# Patient Record
Sex: Male | Born: 1965 | Race: White | Hispanic: No | Marital: Married | Smoking: Former smoker
Health system: Southern US, Community
[De-identification: ages and names within clinical notes are randomized; demographics above are authoritative.]

## PROBLEM LIST (undated history)

## (undated) DIAGNOSIS — Z860100 Personal history of colon polyps, unspecified: Secondary | ICD-10-CM

## (undated) DIAGNOSIS — E785 Hyperlipidemia, unspecified: Secondary | ICD-10-CM

## (undated) DIAGNOSIS — Z87442 Personal history of urinary calculi: Secondary | ICD-10-CM

## (undated) DIAGNOSIS — Z8669 Personal history of other diseases of the nervous system and sense organs: Secondary | ICD-10-CM

## (undated) DIAGNOSIS — M2550 Pain in unspecified joint: Secondary | ICD-10-CM

## (undated) DIAGNOSIS — R531 Weakness: Secondary | ICD-10-CM

## (undated) HISTORY — PX: VASECTOMY: SHX75

## (undated) HISTORY — PX: COLONOSCOPY: SHX174

## (undated) HISTORY — PX: CARPAL TUNNEL RELEASE: SHX101

---

## 2000-10-30 ENCOUNTER — Emergency Department (HOSPITAL_COMMUNITY): Admission: EM | Admit: 2000-10-30 | Discharge: 2000-10-31 | Payer: Self-pay | Admitting: Emergency Medicine

## 2000-11-07 ENCOUNTER — Encounter: Admission: RE | Admit: 2000-11-07 | Discharge: 2000-11-07 | Payer: Self-pay | Admitting: Family Medicine

## 2003-03-12 ENCOUNTER — Encounter: Admission: RE | Admit: 2003-03-12 | Discharge: 2003-03-12 | Payer: Self-pay | Admitting: Family Medicine

## 2006-11-19 ENCOUNTER — Encounter: Payer: Self-pay | Admitting: Family Medicine

## 2006-12-31 ENCOUNTER — Encounter: Payer: Self-pay | Admitting: Family Medicine

## 2008-12-10 ENCOUNTER — Ambulatory Visit: Payer: Self-pay | Admitting: Family Medicine

## 2008-12-10 DIAGNOSIS — R490 Dysphonia: Secondary | ICD-10-CM

## 2008-12-15 ENCOUNTER — Ambulatory Visit: Payer: Self-pay | Admitting: Family Medicine

## 2008-12-17 LAB — CONVERTED CEMR LAB
CO2: 29 meq/L (ref 19–32)
Chloride: 106 meq/L (ref 96–112)
Cholesterol: 235 mg/dL — ABNORMAL HIGH (ref 0–200)
Creatinine, Ser: 1 mg/dL (ref 0.4–1.5)
Direct LDL: 143.3 mg/dL
PSA: 0.64 ng/mL (ref 0.10–4.00)
Potassium: 4.9 meq/L (ref 3.5–5.1)
Sodium: 143 meq/L (ref 135–145)
Total CHOL/HDL Ratio: 7
VLDL: 45.8 mg/dL — ABNORMAL HIGH (ref 0.0–40.0)

## 2009-03-21 ENCOUNTER — Ambulatory Visit: Payer: Self-pay | Admitting: Family Medicine

## 2009-03-22 LAB — CONVERTED CEMR LAB
Cholesterol: 204 mg/dL — ABNORMAL HIGH (ref 0–200)
HDL: 35.5 mg/dL — ABNORMAL LOW (ref >39.00)
Triglycerides: 196 mg/dL — ABNORMAL HIGH (ref 0.0–149.0)
VLDL: 39.2 mg/dL (ref 0.0–40.0)

## 2009-05-18 ENCOUNTER — Ambulatory Visit: Payer: Self-pay | Admitting: Family Medicine

## 2009-05-18 DIAGNOSIS — M2140 Flat foot [pes planus] (acquired), unspecified foot: Secondary | ICD-10-CM | POA: Insufficient documentation

## 2009-05-18 DIAGNOSIS — M25569 Pain in unspecified knee: Secondary | ICD-10-CM | POA: Insufficient documentation

## 2009-06-06 ENCOUNTER — Ambulatory Visit: Payer: Self-pay | Admitting: Family Medicine

## 2009-06-06 DIAGNOSIS — M7750 Other enthesopathy of unspecified foot: Secondary | ICD-10-CM | POA: Insufficient documentation

## 2009-06-21 ENCOUNTER — Ambulatory Visit: Payer: Self-pay | Admitting: Family Medicine

## 2009-06-21 DIAGNOSIS — E7800 Pure hypercholesterolemia, unspecified: Secondary | ICD-10-CM

## 2009-06-22 LAB — CONVERTED CEMR LAB
Cholesterol: 226 mg/dL — ABNORMAL HIGH (ref 0–200)
Direct LDL: 124.5 mg/dL
Total CHOL/HDL Ratio: 6
VLDL: 40.2 mg/dL — ABNORMAL HIGH (ref 0.0–40.0)

## 2009-10-10 ENCOUNTER — Ambulatory Visit: Payer: Self-pay | Admitting: Family Medicine

## 2009-10-10 DIAGNOSIS — M719 Bursopathy, unspecified: Secondary | ICD-10-CM

## 2009-10-10 DIAGNOSIS — M67919 Unspecified disorder of synovium and tendon, unspecified shoulder: Secondary | ICD-10-CM | POA: Insufficient documentation

## 2009-10-21 ENCOUNTER — Encounter: Payer: Self-pay | Admitting: Family Medicine

## 2009-10-21 ENCOUNTER — Ambulatory Visit: Payer: Self-pay | Admitting: Family Medicine

## 2009-10-21 DIAGNOSIS — L909 Atrophic disorder of skin, unspecified: Secondary | ICD-10-CM | POA: Insufficient documentation

## 2009-10-21 DIAGNOSIS — L919 Hypertrophic disorder of the skin, unspecified: Secondary | ICD-10-CM

## 2009-10-24 ENCOUNTER — Telehealth: Payer: Self-pay | Admitting: Family Medicine

## 2010-03-19 ENCOUNTER — Encounter: Payer: Self-pay | Admitting: Family Medicine

## 2010-03-28 NOTE — Letter (Signed)
Summary: [facility]  [facility]   Imported By: Lanelle Bal 05/26/2009 13:55:28  _____________________________________________________________________  External Attachment:    Type:   Image     Comment:   External Document

## 2010-03-28 NOTE — Miscellaneous (Signed)
Summary: Consent to Special Procedure  Consent to Special Procedure   Imported By: Lanelle Bal 11/02/2009 11:16:43  _____________________________________________________________________  External Attachment:    Type:   Image     Comment:   External Document
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
## 2010-03-28 NOTE — Assessment & Plan Note (Signed)
Summary: KNEE ,FOOT PAIN/CLE   Vital Signs:  Patient profile:   45 year old male Height:      65 inches Weight:      174.38 pounds BMI:     29.12 Temp:     98.4 degrees F oral Pulse rate:   96 / minute Pulse rhythm:   regular BP sitting:   106 / 78  (left arm) Cuff size:   regular  Vitals Entered By: Delilah Shan CMA Duncan Dull) (May 18, 2009 3:48 PM) CC: Knee and foot pain   History of Present Illness: 45 yo here with multiple complaints.  Knees have been hurting him for 3 -4 years.  Right >Left.  Says he was seeing a physician at [facility] who told him his knee caps were busted.  Does not have more details than that. Knees are never swollen, red or painful to touch. He stands on concrete all day and by the end of the day, walking is very painful. Never felt his knees lock or give out. Feels it is getting worse over past several months. Would rather see [place] than return to ortho.  Top of right foot hurts frequently as well. Does not give out or turn.    Allergies: No Known Drug Allergies  Review of Systems      See HPI  Physical Exam  General:  Well-developed,well-nourished,in no acute distress; alert,appropriate and cooperative throughout examination Extremities:  Pes planus bilaterally, no obvious instability.   Knee Exam  Knee Exam:    Right:    Inspection:  Normal    Palpation:  Normal    Stability:  stable    Tenderness:  no    Swelling:  no    Erythema:  no    Left:    Inspection:  Normal    Palpation:  Normal    Stability:  unstable medial collateral    Tenderness:  medial collateral    Swelling:  no    Erythema:  no   Impression & Recommendations:  Problem # 1:  KNEE PAIN, BILATERAL (ICD-719.46) Assessment New Will get MRIs of knees given the duration and progression of these symptoms to rule out mensical injury. His updated medication list for this problem includes:    Meloxicam 15 Mg Tabs (Meloxicam) ..... One by mouth daily  Orders: Radiology  Referral (Radiology)  His updated medication list for this problem includes:    Meloxicam 15 Mg Tabs (Meloxicam) ..... One by mouth daily  Problem # 2:  PES PLANUS (ICD-734) Assessment: New Will refer to Dr. Patsy Lager for evaluation, may benefit from orthotics.  Complete Medication List: 1)  Meloxicam 15 Mg Tabs (Meloxicam) .... One by mouth daily  Patient Instructions: 1)  Please stop by to see Shirlee Limerick on your way out. Prescriptions: MELOXICAM 15 MG TABS (MELOXICAM) one by mouth daily  #30 x 0   Entered and Authorized by:   Ruthe Mannan MD   Signed by:   Ruthe Mannan MD on 05/18/2009   Method used:   Electronically to        [facility] (retail)       [address]       [place], [place]  [address]       Ph: [phone]       Fax: [phone]   RxID:   [id number]
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
## 2010-03-28 NOTE — Progress Notes (Signed)
  Phone Note Call from Patient   Caller: Patient Summary of Call: Patient called about scheduling his MRI of shoulder. He has an ortho- Dr Priscille Kluver that had done his surgerys before. [facility] He would like to cancel the MRI and would like to see Dr Priscille Kluver instead, because he knows his history and it might be related. Initial call taken by: Carlton Adam,  October 24, 2009 11:52 AM  Follow-up for Phone Call        Does he need a referral? Ruthe Mannan MD  October 24, 2009 12:10 PM   Additional Follow-up for Phone Call Additional follow up Details #1::        I called and made him an appt for this Friday and also faxed your notes. Additional Follow-up by: Carlton Adam,  October 25, 2009 9:20 AM

## 2010-03-28 NOTE — Letter (Signed)
Summary: [facility]  [facility]   Imported By: Lanelle Bal 05/26/2009 13:54:38  _____________________________________________________________________  External Attachment:    Type:   Image     Comment:   External Document

## 2010-11-13 ENCOUNTER — Other Ambulatory Visit (INDEPENDENT_AMBULATORY_CARE_PROVIDER_SITE_OTHER): Payer: BC Managed Care – PPO

## 2010-11-13 ENCOUNTER — Other Ambulatory Visit: Payer: Self-pay | Admitting: Family Medicine

## 2010-11-13 DIAGNOSIS — Z0000 Encounter for general adult medical examination without abnormal findings: Secondary | ICD-10-CM

## 2010-11-13 DIAGNOSIS — E7800 Pure hypercholesterolemia, unspecified: Secondary | ICD-10-CM

## 2010-11-13 LAB — HEPATIC FUNCTION PANEL
AST: 28 U/L (ref 0–37)
Albumin: 4.3 g/dL (ref 3.5–5.2)
Alkaline Phosphatase: 50 U/L (ref 39–117)
Total Protein: 7.4 g/dL (ref 6.0–8.3)

## 2010-11-13 LAB — LIPID PANEL
Cholesterol: 256 mg/dL — ABNORMAL HIGH (ref 0–200)
Triglycerides: 280 mg/dL — ABNORMAL HIGH (ref 0.0–149.0)
VLDL: 56 mg/dL — ABNORMAL HIGH (ref 0.0–40.0)

## 2010-11-13 LAB — BASIC METABOLIC PANEL
BUN: 14 mg/dL (ref 6–23)
Chloride: 103 mEq/L (ref 96–112)
GFR: 88.17 mL/min (ref >60.00)
Potassium: 4 mEq/L (ref 3.5–5.1)

## 2010-11-13 LAB — LDL CHOLESTEROL, DIRECT: Direct LDL: 157.8 mg/dL

## 2010-11-15 ENCOUNTER — Encounter: Payer: Self-pay | Admitting: Family Medicine

## 2010-11-16 ENCOUNTER — Encounter: Payer: Self-pay | Admitting: Family Medicine

## 2010-11-16 ENCOUNTER — Ambulatory Visit (INDEPENDENT_AMBULATORY_CARE_PROVIDER_SITE_OTHER): Payer: BC Managed Care – PPO | Admitting: Family Medicine

## 2010-11-16 VITALS — BP 110/80 | HR 86 | Temp 97.6°F | Ht 65.0 in | Wt 184.8 lb

## 2010-11-16 DIAGNOSIS — Z0000 Encounter for general adult medical examination without abnormal findings: Secondary | ICD-10-CM

## 2010-11-16 DIAGNOSIS — E7800 Pure hypercholesterolemia, unspecified: Secondary | ICD-10-CM | POA: Insufficient documentation

## 2010-11-16 NOTE — Progress Notes (Signed)
Subjective:    Patient ID: Jeremiah Carpenter, male    DOB: 01/10/1966, 45 y.o.   MRN: [id number]  HPI  45 yo here for CPX.  Overall doing very well. Started a new job with [employer], eating some fattier foods at [employer].  Lipids had previously been diet controlled but have gone back up this month.  Lab Results  Component Value Date   CHOL 256* 11/13/2010   CHOL 226* 06/21/2009   CHOL 204* 03/21/2009   Lab Results  Component Value Date   HDL 41.60 11/13/2010   HDL 40.98 06/21/2009   HDL 11.91* 03/21/2009    Lab Results  Component Value Date   TRIG 280.0* 11/13/2010   TRIG 201.0* 06/21/2009   TRIG 196.0* 03/21/2009    Lab Results  Component Value Date   LDLDIRECT 157.8 11/13/2010   LDLDIRECT 124.5 06/21/2009   LDLDIRECT 131.7 03/21/2009      Patient Active Problem List  Diagnoses  . HYPERCHOLESTEROLEMIA  . SKIN TAG  . Pain in joint, lower leg  . ROTATOR CUFF SYNDROME, LEFT  . METATARSALGIA  . PES PLANUS  . HOARSENESS  . Routine general medical examination at a health care facility   Past Medical History  Diagnosis Date  . Lyme disease    Past Surgical History  Procedure Date  . Carpal tunnel release     bilateral   History  Substance Use Topics  . Smoking status: Former Games developer  . Smokeless tobacco: Not on file  . Alcohol Use: No   Family History  Problem Relation Age of Onset  . Cancer Mother     lung   Allergies not on file Current Outpatient Prescriptions on File Prior to Visit  Medication Sig Dispense Refill  . meloxicam (MOBIC) 15 MG tablet Take 15 mg by mouth daily.           Review of Systems Patient reports no  vision/ hearing changes,anorexia, weight change, fever ,adenopathy, persistant / recurrent hoarseness, swallowing issues, chest pain, edema,persistant / recurrent cough, hemoptysis, dyspnea(rest, exertional, paroxysmal nocturnal), gastrointestinal  bleeding (melena, rectal bleeding), abdominal pain, excessive heart burn, GU  symptoms(dysuria, hematuria, pyuria, voiding/incontinence  Issues) syncope, focal weakness, severe memory loss, concerning skin lesions, depression, anxiety, abnormal bruising/bleeding, major joint swelling.       Objective:   Physical Exam BP 110/80  Pulse 86  Temp(Src) 97.6 F (36.4 C) (Oral)  Ht 5\' 5"  (1.651 m)  Wt 184 lb 12.8 oz (83.825 kg)  BMI 30.75 kg/m2  SpO2 98%  General:  overweght male in NAD Eyes:  PERRL Ears:  External ear exam shows no significant lesions or deformities.  Otoscopic examination reveals clear canals, tympanic membranes are intact bilaterally without bulging, retraction, inflammation or discharge. Hearing is grossly normal bilaterally. Nose:  External nasal examination shows no deformity or inflammation. Nasal mucosa are pink and moist without lesions or exudates. Mouth:  Oral mucosa and oropharynx without lesions or exudates.  Teeth in good repair. Neck:  no carotid bruit or thyromegaly no cervical or supraclavicular lymphadenopathy  Lungs:  Normal respiratory effort, chest expands symmetrically. Lungs are clear to auscultation, no crackles or wheezes. Heart:  Normal rate and regular rhythm. S1 and S2 normal without gallop, murmur, click, rub or other extra sounds. Abdomen:  Bowel sounds positive,abdomen soft and non-tender without masses, organomegaly or hernias noted. Genitalia:  Testes bilaterally descended without nodularity, tenderness or masses. No scrotal masses or lesions. No penis lesions or urethral discharge. Prostate: Pt declined Pulses:  R and L posterior tibial pulses are full and equal bilaterally  Extremities:  no edema        Assessment & Plan:   1. Routine general medical examination at a health care facility   Reviewed preventive care protocols, scheduled due services, and updated immunizations Discussed nutrition, exercise, diet, and healthy lifestyle.    2. Hypercholesterolemia   Deteriorated.  Discussed low cholesterol  diet. See pt instructions for details.

## 2010-11-16 NOTE — Patient Instructions (Signed)
Preventative Care for Adults, Male MAINTAIN REGULAR HEALTH EXAMS  A routine yearly physical is a good way to check in with your primary care provider about your health and preventive screening. It is also an opportunity to share updates about your health and any concerns you have and receive a thorough all-over exam.   If you smoke or chew tobacco, find out from your caregiver how to quit. It can literally save your life, no matter how long you have been a tobacco user. If you do not use tobacco, never begin.   Maintain a healthy diet and normal weight. Increased weight leads to problems with blood pressure and diabetes. Decrease saturated fat in the diet and increase regular exercise. Get information about proper diet from your caregiver if necessary. Eat a variety of foods, including fruit, vegetables, animal or vegetable protein, such as meat, fish, chicken, and eggs, or beans, lentils, tofu, and grains, such as rice.   High blood pressure causes heart and blood vessel problems. Fat leaves deposits in your arteries that can block them. This causes heart disease and vessel disease elsewhere in your body. Check your blood pressure regularly and keep it within normal limits. Men over age 30 or those who have a family history of high blood pressure should have it checked at least every year.   Aerobic exercise helps maintain good heart health. 30 minutes of moderate-intensity exercise is recommended. For example, a brisk walk that increases your heart rate and breathing. This walk should be done on most days of the week. Persistent high blood pressure should be treated with medicine if weight loss and exercise do not work.   For many men aged 76 and older, having a cholesterol test of the blood every 5 years is recommended. If your cholesterol is found to be borderline high, or if you have heart disease or certain other medical conditions, then you may need to have it monitored more frequently.   Avoid  smoking, drinking alcohol in excess (more than two drinks per day) or use of street drugs. Do not share needles with anyone. Ask for professional help if you need assistance or instructions on stopping the use of alcohol, cigarettes, and/or drugs.   Maintain normal blood lipids and cholesterol by minimizing your intake of saturated fat. Eat a well rounded diet otherwise, with plenty of fruit and vegetables. The [employer] encourage men to eat 5-9 servings of fruit and vegetables each day. Your caregiver can help you keep your risk of heart disease or stroke at a lower level.   Triglycerides are too high.  Weight loss (even small amount) can decrease triglycerides.  Decrease added sugars, eliminate trans fats, increase fiber and limit alcohol.  All these changes together can drop triglycerides by almost 50%.   Diabetes screening assesses your blood sugar level after a fasting once every 3 years after age 35 if previous tests were normal.   Most routine colon cancer screening begins at the age of 8. On a yearly basis, doctors may provide special easy to use take-home tests to check for hidden blood in the stool. Sigmoidoscopy or colonoscopy can detect the earliest forms of colon cancer and is life saving. These test use a small camera at the end of a tube to directly examine the colon. Speak to your caregiver about this at age 35, when routine screening begins (and is repeated every 5 years unless early forms of pre-cancerous polyps or small growths are found).  At the age of 64 men usually start screening for prostate cancer every year. Screening may begin at a younger age for those with higher risk. Those at higher risk include African-Americans or having a family history of prostate cancer. There are two types of tests for prostate cancer:   Prostate-specific antigen (PSA) testing. Recent studies raise questions about prostate cancer using PSA and you should discuss this with your  caregiver.   Digital rectal exam (in which your doctor's lubricated and gloved finger feels for enlargement of the prostate through the anus).   Practice safe sex. Use condoms. Condoms are used for birth control and to help reduce the spread of sexually transmitted infections (or STIs). Unsafe sex is having an unprotected physical relationship with someone who is bisexual, homosexual, uses intravenous street drugs, or going with someone who has sexual relations with high-risk groups. Practicing safe sex helps you avoid getting an STI. Some of the STIs are gonorrhea (the clap), chlamydia, syphilis, trichimonas, herpes, HPV (human papiloma virus) and HIV (human immunodeficiency virus) which causes AIDS. The herpes, HIV and HPV are viral illnesses that have no cure. These can result in disability, cancer and death.   It is not safe for someone who has AIDS or is HIV positive to have unprotected sex with someone else who is positive. The reason for this is the fact that there are many different strains of HIV. If you have a strain that is readily treated with medications and then suddenly introduce a strain from a partner that has no further treatment options, you may suddenly have a strain of HIV that is untreatable. Even if you are both positive for HIV, it is still necessary to practice safe sex.   Use sunscreen with a SPF (or skin protection factor) of 15 or greater. Apply sunscreen liberally and repeatedly throughout the day. Being outside in the sun when your shadow caused by the sun is shorter than you are, means you are being exposed to sun at greater intensity. Lighter skinned people are at a greater risk of skin cancer. Don't forget to also wear sunglasses in order to protect your eyes from too much damaging sunlight. Damaging sunlight can accelerate cataract formation.   Once a month do a whole body skin exam or review, using a mirror to look at your back. Notify your caregivers of changes in moles,  especially if there are changes in shapes, colors, a size larger than a pencil eraser, an irregular border, or development of new moles.   Keep carbon monoxide and smoke detectors in your home functioning at all times. Change the batteries every 6 months or use a model that plugs into the wall.   Do a monthly exam of your testicles. Gently roll each testicle between your thumb and fingers, feeling for any abnormal lumps. The best time to do this is after a hot shower or bath when the tissues are looser. Notify your caregivers of any lumps, tenderness or changes in size or shape immediately.   Stay up to date with your tetanus shots and other required immunizations. You should have a booster for tetanus every 10 years. Be sure to get your flu shot every year, since 5%-20% of the [place] population comes down with the flu. The flu vaccine changes each year, so being vaccinated once is not enough. Get your shot in the fall, before the flu season peaks. The table below lists important vaccines to get. Other vaccines to consider include:   Brush  your teeth twice a day with fluoride toothpaste, and floss once a day. Good oral hygiene prevents tooth decay and gum disease. The problems can be painful, unattractive, and can cause other health problems. Visit your dentist for a routine oral and dental check up and preventive care every 6-12 months.   The Body Mass Index or BMI is a way of measuring how much of your body is fat. Having a BMI above 27 increases the risk of heart disease, diabetes, hypertension, stroke and other problems related to obesity. Your caregiver can help determine your BMI and based on it develop an exercise and dietary program to help you achieve or maintain this important measurement at a healthful level.   Wear seat belts whenever in a vehicle, whether a passenger or driver, and even for very short drives of a few minutes.   If you bicycle, wear a helmet at all times.

## 2011-09-03 ENCOUNTER — Ambulatory Visit (INDEPENDENT_AMBULATORY_CARE_PROVIDER_SITE_OTHER): Payer: 59 | Admitting: Family Medicine

## 2011-09-03 ENCOUNTER — Ambulatory Visit (INDEPENDENT_AMBULATORY_CARE_PROVIDER_SITE_OTHER)
Admission: RE | Admit: 2011-09-03 | Discharge: 2011-09-03 | Disposition: A | Discharge status: Home / Self Care | Payer: 59 | Source: Ambulatory Visit | Attending: Family Medicine | Admitting: Family Medicine

## 2011-09-03 ENCOUNTER — Encounter: Payer: Self-pay | Admitting: Family Medicine

## 2011-09-03 VITALS — BP 128/92 | HR 72 | Temp 98.1°F | Wt 187.0 lb

## 2011-09-03 DIAGNOSIS — R5381 Other malaise: Secondary | ICD-10-CM

## 2011-09-03 DIAGNOSIS — R0602 Shortness of breath: Secondary | ICD-10-CM

## 2011-09-03 DIAGNOSIS — R42 Dizziness and giddiness: Secondary | ICD-10-CM

## 2011-09-03 DIAGNOSIS — R5383 Other fatigue: Secondary | ICD-10-CM | POA: Insufficient documentation

## 2011-09-03 LAB — CBC WITH DIFFERENTIAL/PLATELET
Basophils Relative: 0.2 % (ref 0.0–3.0)
Eosinophils Absolute: 0 10*3/uL (ref 0.0–0.7)
HCT: 43.9 % (ref 39.0–52.0)
Hemoglobin: 14.7 g/dL (ref 13.0–17.0)
Lymphs Abs: 2.5 10*3/uL (ref 0.7–4.0)
MCHC: 33.5 g/dL (ref 30.0–36.0)
MCV: 89.9 fl (ref 78.0–100.0)
Monocytes Absolute: 0.7 10*3/uL (ref 0.1–1.0)
Neutro Abs: 4.3 10*3/uL (ref 1.4–7.7)
RBC: 4.88 Mil/uL (ref 4.22–5.81)

## 2011-09-03 LAB — COMPREHENSIVE METABOLIC PANEL
ALT: 24 U/L (ref 0–53)
AST: 20 U/L (ref 0–37)
Alkaline Phosphatase: 46 U/L (ref 39–117)
BUN: 14 mg/dL (ref 6–23)
Creatinine, Ser: 1 mg/dL (ref 0.4–1.5)
Potassium: 4.2 mEq/L (ref 3.5–5.1)

## 2011-09-03 LAB — TESTOSTERONE: Testosterone: 195.11 ng/dL — ABNORMAL LOW (ref 350.00–890.00)

## 2011-09-03 NOTE — Progress Notes (Signed)
Subjective:    Patient ID: Jeremiah Carpenter, male    DOB: Apr 26, 1965, 46 y.o.   MRN: [id number]  HPI  46 yo here for several days of "not feeling right."  He feels a little dizzy, on and off.  Does not feel positional, does not worsen with change in head positions.  Does not feel like room is spinning. No nausea or vomiting.  Has had a little blurred vision but just had an eye exam- new prescription ordered but has not been received yet.  Has had intermittent chest pain- non exertional but assumed that was MSK strain from extra work he has been doing.  Does have family h/o CAD- dad had MI in 58s.  He is complaining of "needing to catch my breath at times." He also felt a little clamy and diaphoretic this morning.  Denies any increased stressors, anxiety or depression. Sleeping well. Lab Results  Component Value Date   CHOL 256* 11/13/2010   HDL 41.60 11/13/2010   LDLDIRECT 157.8 11/13/2010   TRIG 280.0* 11/13/2010   CHOLHDL 6 11/13/2010     Patient Active Problem List  Diagnosis  . HYPERCHOLESTEROLEMIA  . SKIN TAG  . Pain in joint, lower leg  . ROTATOR CUFF SYNDROME, LEFT  . METATARSALGIA  . PES PLANUS  . HOARSENESS  . Routine general medical examination at a health care facility  . Hypercholesterolemia  . Fatigue  . Dizziness   Past Medical History  Diagnosis Date  . Lyme disease    Past Surgical History  Procedure Date  . Carpal tunnel release     bilateral   History  Substance Use Topics  . Smoking status: Former Games developer  . Smokeless tobacco: Not on file  . Alcohol Use: No   Family History  Problem Relation Age of Onset  . Cancer Mother     lung   No Known Allergies No current outpatient prescriptions on file prior to visit.   The PMH, PSH, Social History, Family History, Medications, and allergies have been reviewed in [facility], and have been updated if relevant.    Review of Systems See HPI No fevers No night sweats  No nausea or vomiting No  changes in bowel habits    Objective:   Physical Exam BP 128/92  Pulse 72  Temp 98.1 F (36.7 C) (Oral)  Wt 187 lb (84.823 kg) General: pleasant male in NAD Eyes:  PERRL Ears:  External ear exam shows no significant lesions or deformities.  Otoscopic examination reveals clear canals, tympanic membranes are intact bilaterally without bulging, retraction, inflammation or discharge. Hearing is grossly normal bilaterally. Nose:  External nasal examination shows no deformity or inflammation. Nasal mucosa are pink and moist without lesions or exudates. Mouth:  Oral mucosa and oropharynx without lesions or exudates.  Teeth in good repair. Neck:  no carotid bruit or thyromegaly no cervical or supraclavicular lymphadenopathy  Lungs:  Normal respiratory effort, chest expands symmetrically. Lungs are clear to auscultation, no crackles or wheezes. Heart:  Normal rate and regular rhythm. S1 and S2 normal without gallop, murmur, click, rub or other extra sounds. Abdomen:  Bowel sounds positive,abdomen soft and non-tender without masses, organomegaly or hernias noted. Pulses:  R and L posterior tibial pulses are full and equal bilaterally  Extremities:  no edema      Assessment & Plan:   1. SOB  New with other multiple non specific complaints. Concern for cardiac source given family history- EKG reassuring- NSR with occassional PACs.  Will check  labs today to rule out other possible contributing factors- thyroid dysfunction, hypoglycemia, hyperglycemia, anemia. The patient indicates understanding of these issues and agrees with the plan.  Comprehensive metabolic panel, CBC with Differential, TSH, Hemoglobin A1c, Testosterone, EKG 12-Lead  2. Dizziness  See above. Comprehensive metabolic panel, CBC with Differential, TSH, Hemoglobin A1c, EKG 12-Lead

## 2011-09-03 NOTE — Patient Instructions (Addendum)
Good to see you. Your chest xray was very reassuring. We are checking labs and a chest xray today. You will hear from [place] today or tomorrow with the results. Please call me immediately if your symptoms deteriorate.

## 2011-09-04 ENCOUNTER — Other Ambulatory Visit: Payer: Self-pay | Admitting: Family Medicine

## 2011-09-04 DIAGNOSIS — R7989 Other specified abnormal findings of blood chemistry: Secondary | ICD-10-CM

## 2011-09-04 MED ORDER — ALPRAZOLAM 0.25 MG PO TABS: 0.2500 mg | ORAL_TABLET | Freq: Three times a day (TID) | ORAL | Status: DC | PRN

## 2011-11-07 ENCOUNTER — Other Ambulatory Visit: Payer: Self-pay | Admitting: Family Medicine

## 2011-11-07 DIAGNOSIS — E7800 Pure hypercholesterolemia, unspecified: Secondary | ICD-10-CM

## 2011-11-07 DIAGNOSIS — Z0000 Encounter for general adult medical examination without abnormal findings: Secondary | ICD-10-CM

## 2011-11-13 ENCOUNTER — Other Ambulatory Visit: Payer: 59

## 2011-11-14 ENCOUNTER — Other Ambulatory Visit (INDEPENDENT_AMBULATORY_CARE_PROVIDER_SITE_OTHER): Payer: 59

## 2011-11-14 DIAGNOSIS — E7800 Pure hypercholesterolemia, unspecified: Secondary | ICD-10-CM

## 2011-11-14 DIAGNOSIS — Z0000 Encounter for general adult medical examination without abnormal findings: Secondary | ICD-10-CM

## 2011-11-14 LAB — COMPREHENSIVE METABOLIC PANEL
ALT: 27 U/L (ref 0–53)
BUN: 20 mg/dL (ref 6–23)
CO2: 28 mEq/L (ref 19–32)
Calcium: 9.2 mg/dL (ref 8.4–10.5)
Chloride: 105 mEq/L (ref 96–112)
Creatinine, Ser: 1.2 mg/dL (ref 0.4–1.5)
GFR: 70.02 mL/min (ref >60.00)
Glucose, Bld: 96 mg/dL (ref 70–99)

## 2011-11-14 LAB — LIPID PANEL: HDL: 34.7 mg/dL — ABNORMAL LOW (ref >39.00)

## 2011-11-20 ENCOUNTER — Encounter: Payer: 59 | Admitting: Family Medicine

## 2012-01-10 ENCOUNTER — Encounter: Payer: 59 | Admitting: Family Medicine

## 2012-03-13 IMAGING — CR DG SHOULDER 2+V*L*
3 series · 3 of 3 positions shown · non-contrast
Comparison: None

CLINICAL DATA: Left shoulder pain.

LEFT SHOULDER - 2+ VIEW

[view not recorded (1 of 3)]
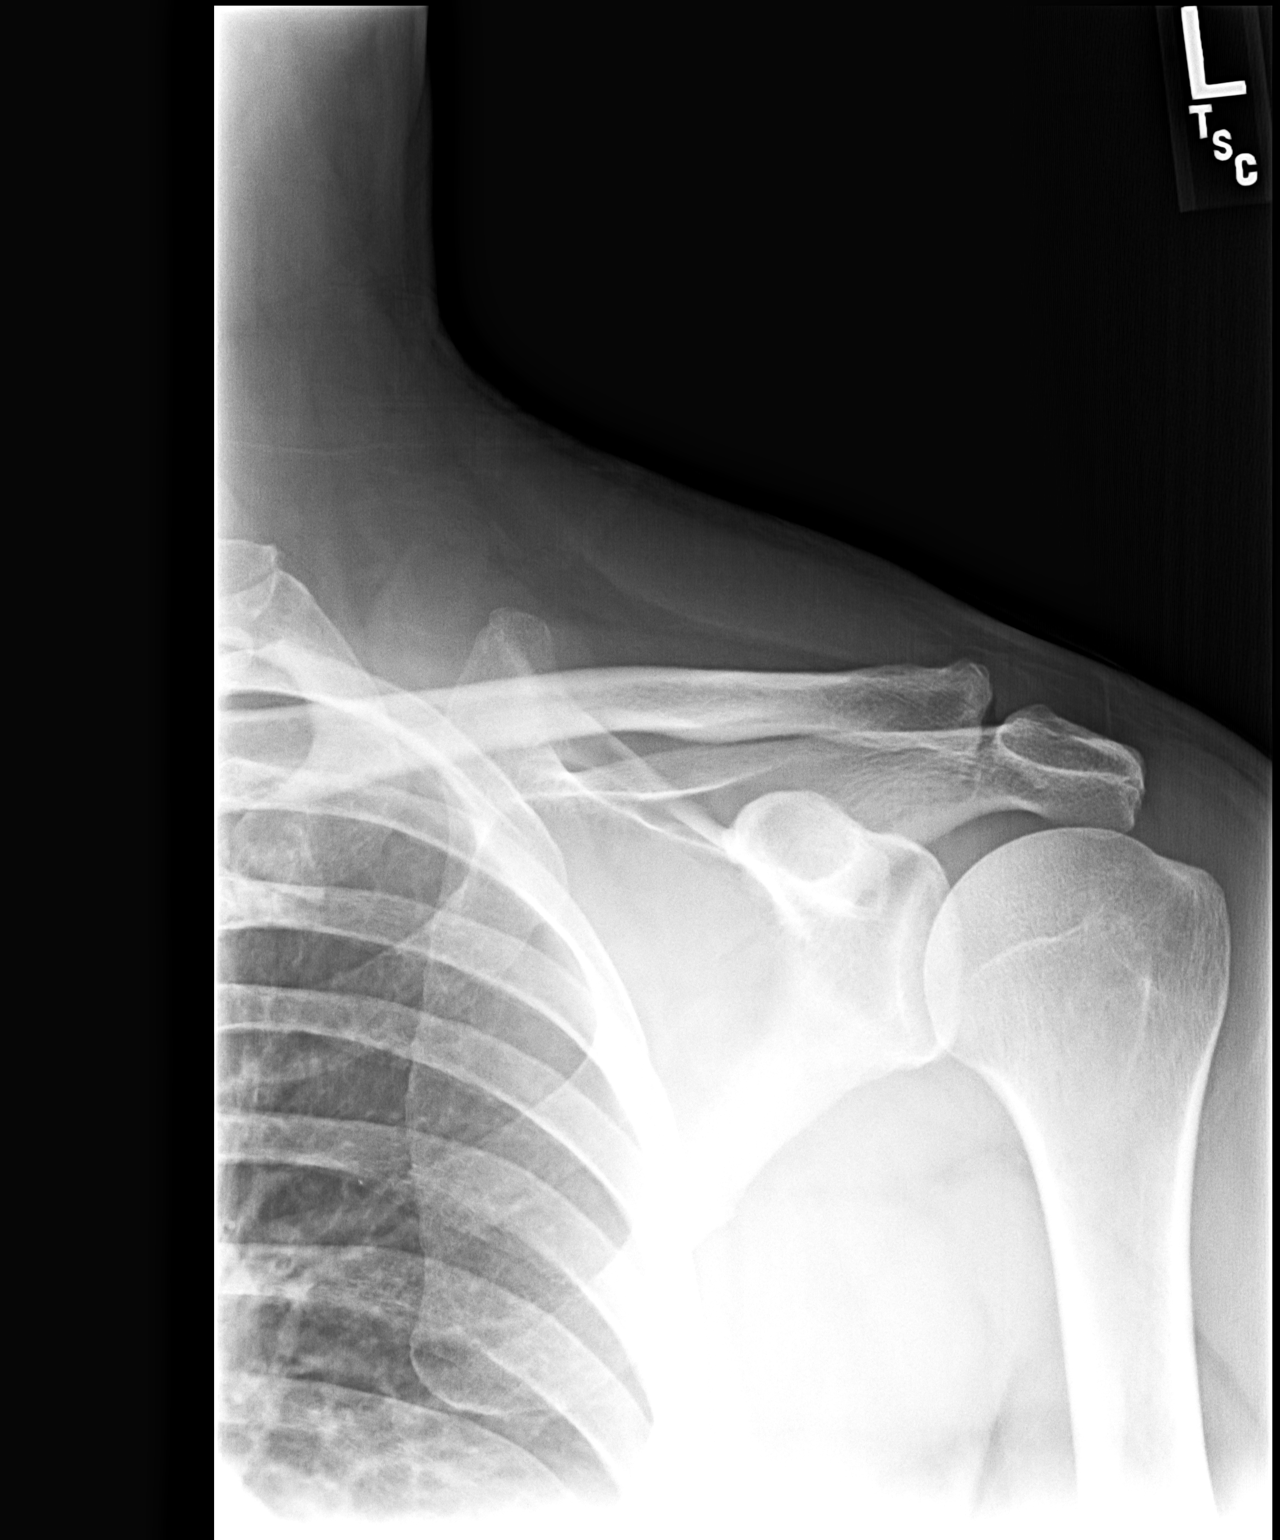

[view not recorded (2 of 3)]
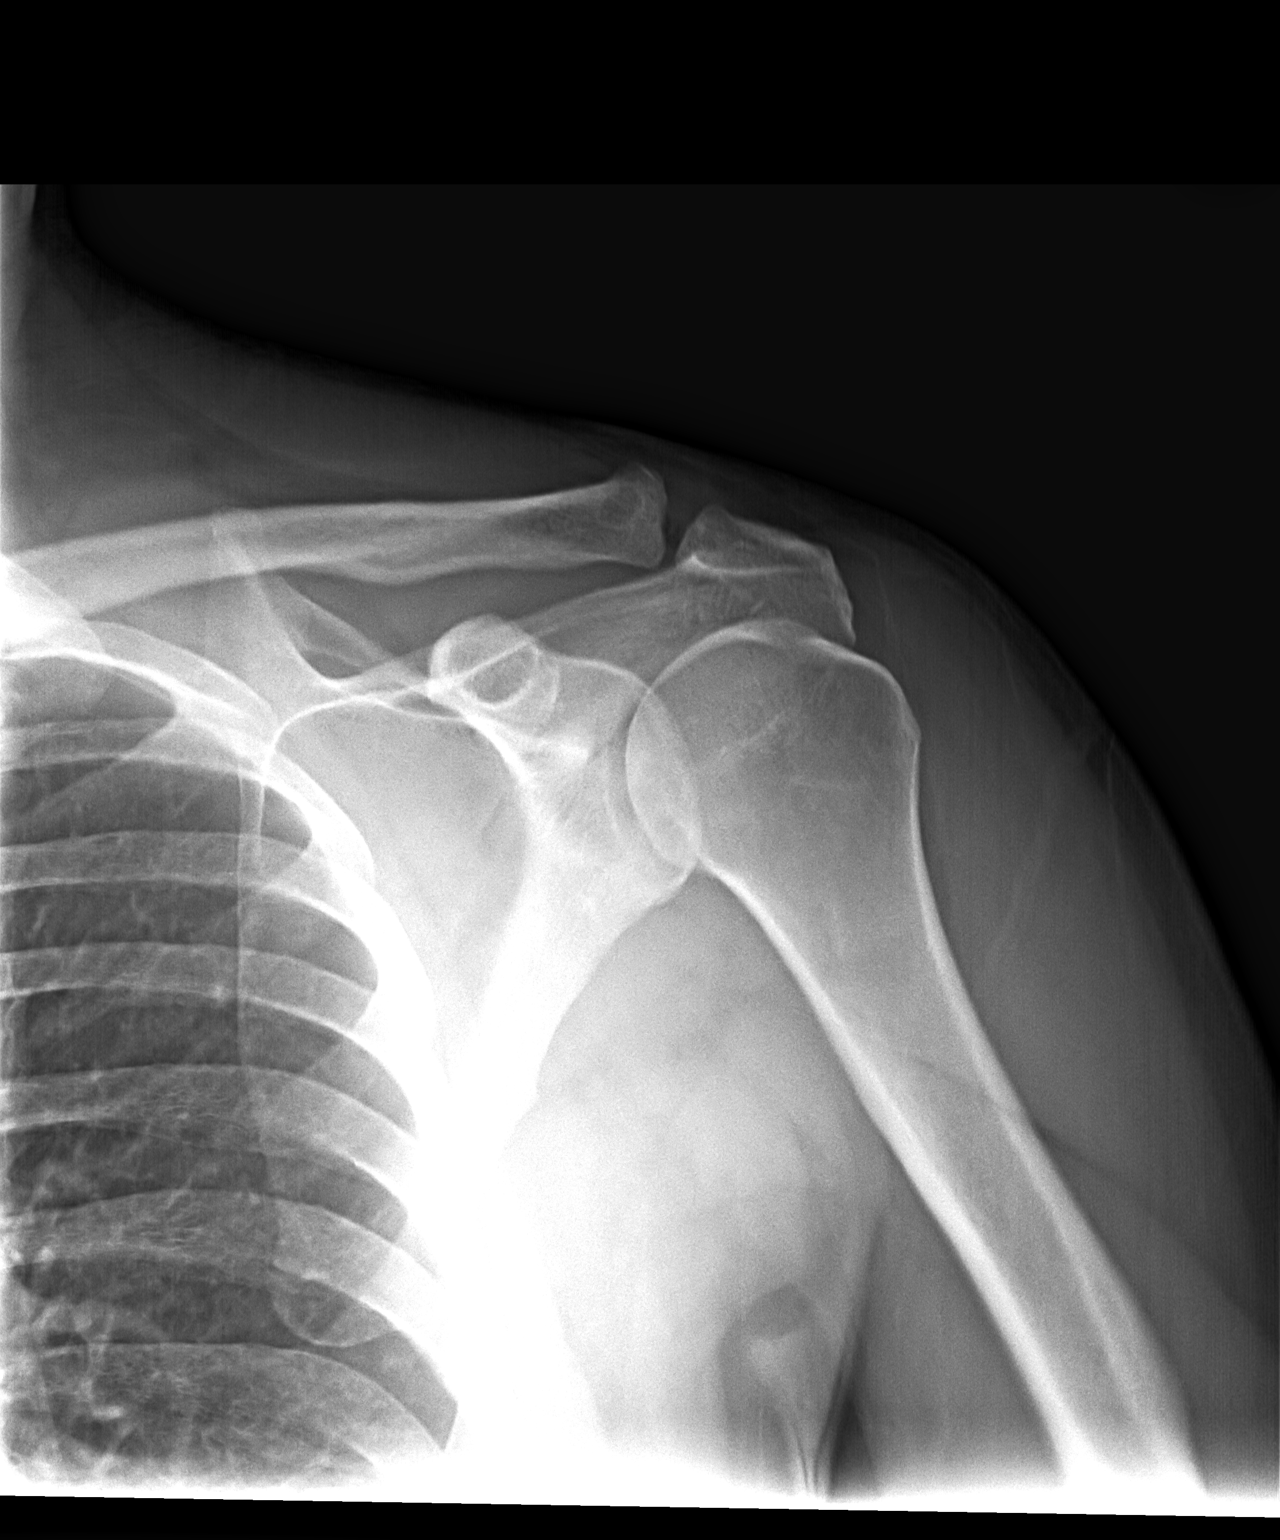

[view not recorded (3 of 3)]
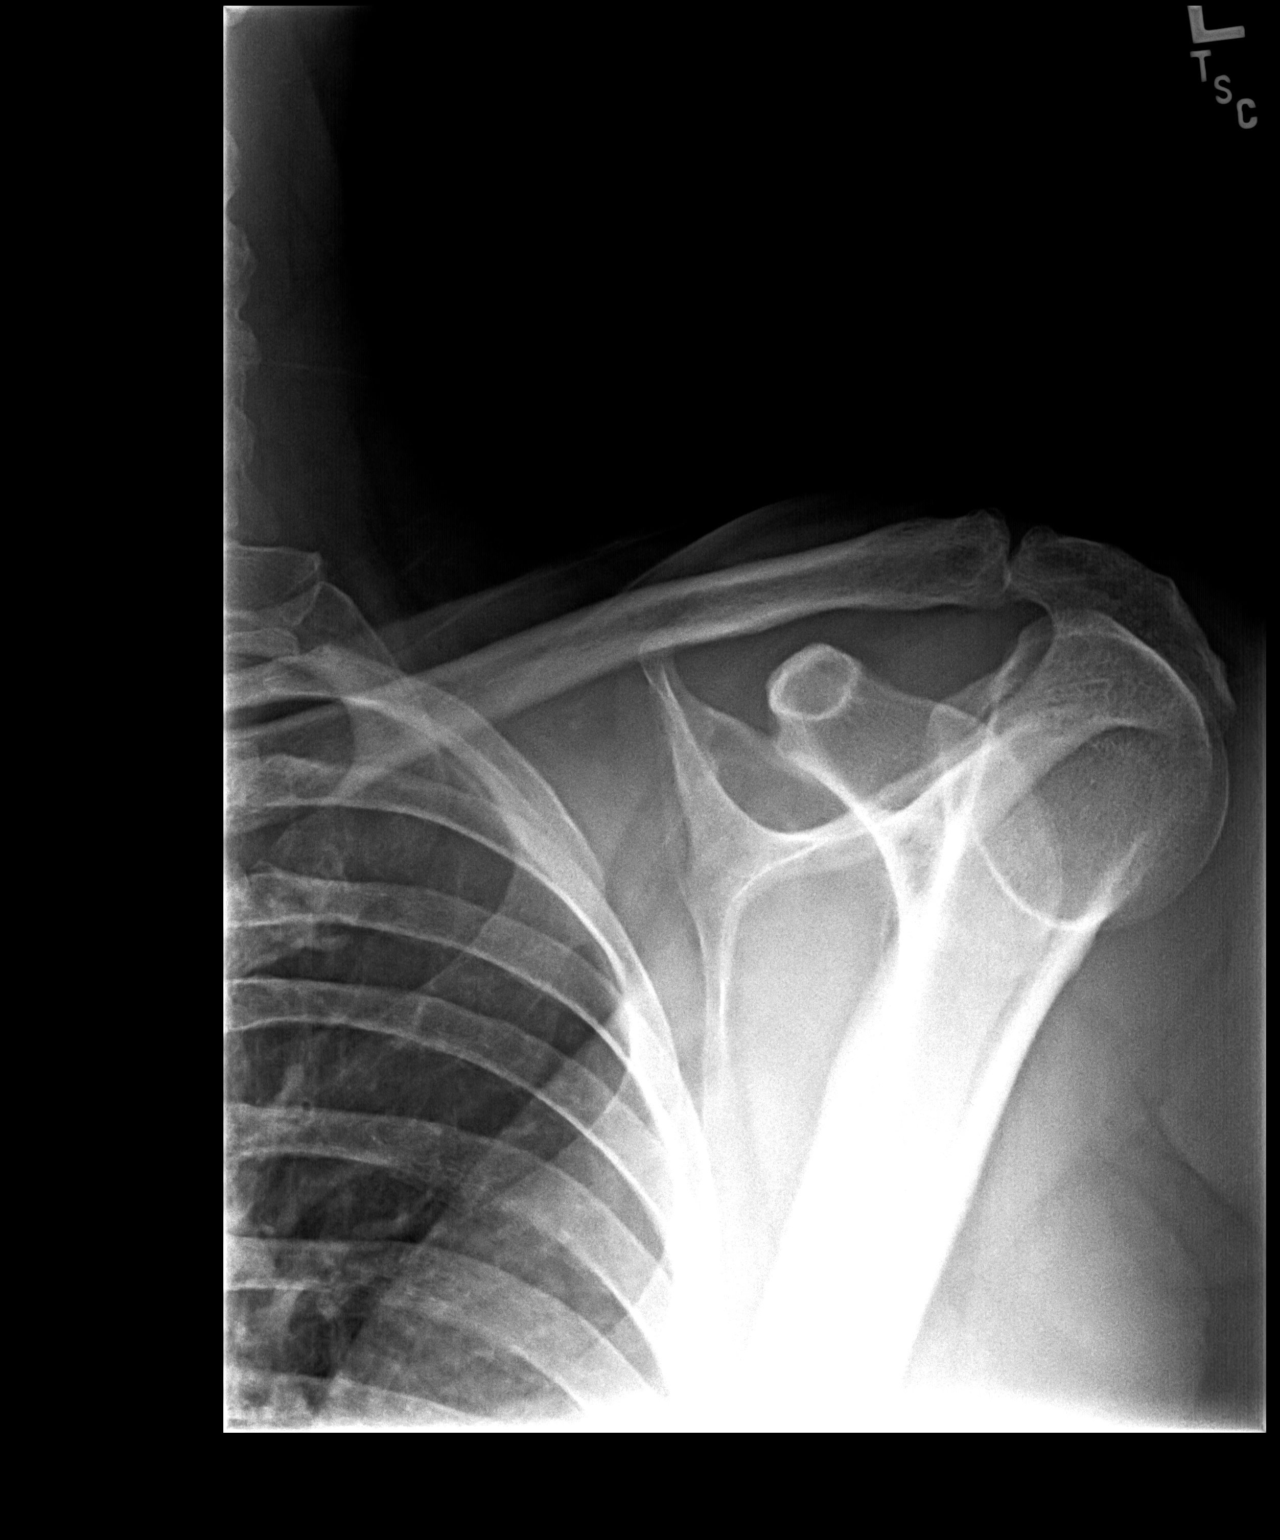

[3 of 3 positions shown; findings below may reference images not displayed]

FINDINGS: There are mild AC joint degenerative changes.  A small
cyst is noted in the distal clavicle.  No acute bony findings.  The
left lung apex is clear.
IMPRESSION: No acute bony findings.

## 2012-04-22 ENCOUNTER — Other Ambulatory Visit: Payer: Self-pay | Admitting: Family Medicine

## 2012-04-22 DIAGNOSIS — Z125 Encounter for screening for malignant neoplasm of prostate: Secondary | ICD-10-CM

## 2012-04-22 DIAGNOSIS — Z0000 Encounter for general adult medical examination without abnormal findings: Secondary | ICD-10-CM

## 2012-04-22 DIAGNOSIS — E7800 Pure hypercholesterolemia, unspecified: Secondary | ICD-10-CM

## 2012-04-29 ENCOUNTER — Other Ambulatory Visit: Payer: 59

## 2012-04-30 ENCOUNTER — Other Ambulatory Visit (INDEPENDENT_AMBULATORY_CARE_PROVIDER_SITE_OTHER): Payer: 59

## 2012-04-30 DIAGNOSIS — Z0000 Encounter for general adult medical examination without abnormal findings: Secondary | ICD-10-CM

## 2012-04-30 DIAGNOSIS — Z125 Encounter for screening for malignant neoplasm of prostate: Secondary | ICD-10-CM

## 2012-04-30 DIAGNOSIS — E7800 Pure hypercholesterolemia, unspecified: Secondary | ICD-10-CM

## 2012-04-30 LAB — COMPREHENSIVE METABOLIC PANEL
Albumin: 4.1 g/dL (ref 3.5–5.2)
CO2: 30 mEq/L (ref 19–32)
GFR: 85.58 mL/min (ref >60.00)
Glucose, Bld: 97 mg/dL (ref 70–99)
Sodium: 141 mEq/L (ref 135–145)
Total Bilirubin: 0.8 mg/dL (ref 0.3–1.2)
Total Protein: 7.2 g/dL (ref 6.0–8.3)

## 2012-04-30 LAB — LIPID PANEL
Cholesterol: 267 mg/dL — ABNORMAL HIGH (ref 0–200)
VLDL: 38.6 mg/dL (ref 0.0–40.0)

## 2012-05-06 ENCOUNTER — Encounter: Payer: Self-pay | Admitting: Family Medicine

## 2012-05-06 ENCOUNTER — Ambulatory Visit (INDEPENDENT_AMBULATORY_CARE_PROVIDER_SITE_OTHER): Payer: 59 | Admitting: Family Medicine

## 2012-05-06 VITALS — BP 130/80 | HR 76 | Temp 97.6°F | Ht 64.75 in | Wt 187.0 lb

## 2012-05-06 DIAGNOSIS — E7800 Pure hypercholesterolemia, unspecified: Secondary | ICD-10-CM

## 2012-05-06 DIAGNOSIS — E291 Testicular hypofunction: Secondary | ICD-10-CM

## 2012-05-06 DIAGNOSIS — R7989 Other specified abnormal findings of blood chemistry: Secondary | ICD-10-CM | POA: Insufficient documentation

## 2012-05-06 DIAGNOSIS — Z0000 Encounter for general adult medical examination without abnormal findings: Secondary | ICD-10-CM

## 2012-05-06 MED ORDER — TESTOSTERONE 30 MG/ACT TD SOLN: 1.0000 "application " | Freq: Every morning | TRANSDERMAL | Status: DC

## 2012-05-06 MED ORDER — ATORVASTATIN CALCIUM 10 MG PO TABS: 10.0000 mg | ORAL_TABLET | Freq: Every day | ORAL | Status: DC

## 2012-05-06 NOTE — Patient Instructions (Addendum)
Good to see you. Please start taking generic Lipitor 10 mg daily. Restart walking on your treadmill and working on diet.  Ok to start an aspirin 81 mg EC (enteric coated) daily.

## 2012-05-06 NOTE — Progress Notes (Signed)
Subjective:    Patient ID: Jeremiah Carpenter, male    DOB: 09/19/1965, 47 y.o.   MRN: [id number]  HPI  47 yo pleasant male here for CPX.    Overall doing very well.   HLD- LDL very high this month.  Has a new job, traveling more.  Not eating well and not exercising. Wt Readings from Last 3 Encounters:  05/06/12 187 lb (84.823 kg)  09/03/11 187 lb (84.823 kg)  11/16/10 184 lb 12.8 oz (83.825 kg)   Does have strong FH of HLD and CAD.  Dad had MI in his 50s or 47s.  Has never been on cholesterol medication- has been previously controlled on diet.    Lab Results  Component Value Date   CHOL 267* 04/30/2012   HDL 38.00* 04/30/2012   LDLDIRECT 190.1 04/30/2012   TRIG 193.0* 04/30/2012   CHOLHDL 7 04/30/2012    Lab Results  Component Value Date   PSA 1.29 04/30/2012   PSA 0.64 12/15/2008    Low testosterone- feels much better on testosterone.  Followed by urology.       Patient Active Problem List  Diagnosis  . HYPERCHOLESTEROLEMIA  . Pain in joint, lower leg  . METATARSALGIA  . PES PLANUS  . Routine general medical examination at a health care facility  . Fatigue   Past Medical History  Diagnosis Date  . Lyme disease    Past Surgical History  Procedure Laterality Date  . Carpal tunnel release      bilateral   History  Substance Use Topics  . Smoking status: Former Games developer  . Smokeless tobacco: Not on file  . Alcohol Use: No   Family History  Problem Relation Age of Onset  . Cancer Mother     lung   No Known Allergies No current outpatient prescriptions on file prior to visit.   No current facility-administered medications on file prior to visit.     Review of Systems Patient reports no  vision/ hearing changes,anorexia, weight change, fever ,adenopathy, persistant / recurrent hoarseness, swallowing issues, chest pain, edema,persistant / recurrent cough, hemoptysis, dyspnea(rest, exertional, paroxysmal nocturnal), gastrointestinal  bleeding (melena, rectal  bleeding), abdominal pain, excessive heart burn, GU symptoms(dysuria, hematuria, pyuria, voiding/incontinence  Issues) syncope, focal weakness, severe memory loss, concerning skin lesions, depression, anxiety, abnormal bruising/bleeding, major joint swelling.       Objective:   Physical Exam BP 130/80  Pulse 76  Temp(Src) 97.6 F (36.4 C)  Ht 5' 4.75" (1.645 m)  Wt 187 lb (84.823 kg)  BMI 31.35 kg/m2 General:  overweght male in NAD Eyes:  PERRL Ears:  External ear exam shows no significant lesions or deformities.  Otoscopic examination reveals clear canals, tympanic membranes are intact bilaterally without bulging, retraction, inflammation or discharge. Hearing is grossly normal bilaterally. Nose:  External nasal examination shows no deformity or inflammation. Nasal mucosa are pink and moist without lesions or exudates. Mouth:  Oral mucosa and oropharynx without lesions or exudates.  Teeth in good repair. Neck:  no carotid bruit or thyromegaly no cervical or supraclavicular lymphadenopathy  Lungs:  Normal respiratory effort, chest expands symmetrically. Lungs are clear to auscultation, no crackles or wheezes. Heart:  Normal rate and regular rhythm. S1 and S2 normal without gallop, murmur, click, rub or other extra sounds. Abdomen:  Bowel sounds positive,abdomen soft and non-tender without masses, organomegaly or hernias noted. Pulses:  R and L posterior tibial pulses are full and equal bilaterally  Extremities:  no edema    Assessment  and Plan: 1. HYPERCHOLESTEROLEMIA Deteriorated.  He is motivated to increase exercise and improve diet.  Start lipitor 10 mg daily.  Follow up labs in 8 weeks.  The patient indicates understanding of these issues and agrees with the plan.  - Comprehensive metabolic panel; Future - Lipid Panel; Future  2. Routine general medical examination at a health care facility Reviewed preventive care protocols, scheduled due services, and updated  immunizations Discussed nutrition, exercise, diet, and healthy lifestyle.   3. Low testosterone Symptoms improved with topical testosterone.

## 2012-06-06 ENCOUNTER — Ambulatory Visit (INDEPENDENT_AMBULATORY_CARE_PROVIDER_SITE_OTHER): Payer: 59 | Admitting: Family Medicine

## 2012-06-06 ENCOUNTER — Telehealth: Payer: Self-pay | Admitting: Family Medicine

## 2012-06-06 ENCOUNTER — Encounter: Payer: Self-pay | Admitting: Family Medicine

## 2012-06-06 VITALS — BP 126/80 | HR 74 | Temp 98.2°F | Wt 185.0 lb

## 2012-06-06 DIAGNOSIS — R109 Unspecified abdominal pain: Secondary | ICD-10-CM | POA: Insufficient documentation

## 2012-06-06 LAB — COMPREHENSIVE METABOLIC PANEL
Albumin: 4.4 g/dL (ref 3.5–5.2)
BUN: 12 mg/dL (ref 6–23)
CO2: 31 mEq/L (ref 19–32)
Glucose, Bld: 76 mg/dL (ref 70–99)
Potassium: 4.9 mEq/L (ref 3.5–5.3)
Sodium: 143 mEq/L (ref 135–145)
Total Protein: 7.1 g/dL (ref 6.0–8.3)

## 2012-06-06 LAB — POCT URINALYSIS DIPSTICK
Ketones, UA: NEGATIVE
Protein, UA: NEGATIVE
Spec Grav, UA: 1.02
Urobilinogen, UA: NEGATIVE

## 2012-06-06 LAB — CBC WITH DIFFERENTIAL/PLATELET
Basophils Absolute: 0 10*3/uL (ref 0.0–0.1)
Basophils Relative: 0 % (ref 0–1)
Eosinophils Absolute: 0.1 10*3/uL (ref 0.0–0.7)
Eosinophils Relative: 1 % (ref 0–5)
HCT: 45.9 % (ref 39.0–52.0)
MCH: 30.1 pg (ref 26.0–34.0)
MCHC: 34 g/dL (ref 30.0–36.0)
MCV: 88.4 fL (ref 78.0–100.0)
Monocytes Absolute: 0.6 10*3/uL (ref 0.1–1.0)
RDW: 13.4 % (ref 11.5–15.5)
WBC: 8.3 10*3/uL (ref 4.0–10.5)

## 2012-06-06 NOTE — Progress Notes (Signed)
  Subjective:    Patient ID: Jeremiah Carpenter, male    DOB: 1965/07/05, 47 y.o.   MRN: [id number]  HPI CC: abd pain  Pleasant 47 yo pt of Dr. Elmer Sow with h/o hypogonadism and HLD on axiron and lipitor presents with 2d h/o constant pressure dull pain in RLQ, at times more evident than others.  Denies inciting trauma, activity.  Has never had anything like this in past.  Associated with mild generalized malaise.  Some increased urinary frequency.  Some R scrotal/testicular discomfort.  More gassy than usual.  Appetite ok.  Denies fevers/chills, nausea/vomiting, diarrhea, constipation.  No dysuria, urgency.  No hematuria.  No back pain, flank pain. No BM changes. Denies hernia or bulge. No increased straining.  No new sexual partners.  No urethral discharge. Has changed eating habits last few weeks - trying to eat more greens/vegetables.  More fiber in det.  Patient Active Problem List  Diagnosis  . HYPERCHOLESTEROLEMIA  . Pain in joint, lower leg  . METATARSALGIA  . PES PLANUS  . Routine general medical examination at a health care facility  . Fatigue  . Low testosterone   Past Medical History  Diagnosis Date  . Lyme disease    Past Surgical History  Procedure Laterality Date  . Carpal tunnel release      bilateral     Review of Systems Per HPI    Objective:   Physical Exam  Nursing note and vitals reviewed. Constitutional: He appears well-developed and well-nourished. No distress.  HENT:  Head: Normocephalic and atraumatic.  Mouth/Throat: Oropharynx is clear and moist. No oropharyngeal exudate.  Cardiovascular: Normal rate, regular rhythm, normal heart sounds and intact distal pulses.   No murmur heard. Pulmonary/Chest: Effort normal and breath sounds normal. No respiratory distress. He has no wheezes. He has no rales.  Abdominal: Soft. Normal appearance and bowel sounds are normal. He exhibits no distension and no mass. There is no hepatosplenomegaly. There is  tenderness (very mild) in the right lower quadrant, periumbilical area and suprapubic area. There is no rigidity, no rebound, no guarding, no CVA tenderness and negative Murphy's sign. No hernia. Hernia confirmed negative in the right inguinal area and confirmed negative in the left inguinal area.  Mild tenderness to deep palpation  Genitourinary: Testes normal and penis normal. Right testis shows no mass, no swelling and no tenderness. Right testis is descended. Left testis shows no mass, no swelling and no tenderness. Left testis is descended. Circumcised.  Musculoskeletal: He exhibits no edema.  Lymphadenopathy:       Right: No inguinal adenopathy present.       Left: No inguinal adenopathy present.  Skin: Skin is warm and dry. No rash noted.  Psychiatric: He has a normal mood and affect.       Assessment & Plan:

## 2012-06-06 NOTE — Assessment & Plan Note (Addendum)
Mild RLQ tenderness on exam, but no evidence of inguinal hernia, normal scrotal exam, not consistent with appendicitis or other acute process today. UA with small blood but micro with only 0-2 RBC/hpf, overall normal. ?related to recent change in diet - suggested he increase fluid intake. Check CMP, CBC today. If persistent discomfort or sxs, return for further evaluation, would recheck UA and consider imaging.

## 2012-06-06 NOTE — Patient Instructions (Addendum)
Urine looking overall ok. I'm not sure where this pain is coming from.  Possibly from recent diet changes (increased fiber without increasing fluids) Try to increase fluids over the weekend. If persistent pain, let us know.

## 2012-06-06 NOTE — Telephone Encounter (Signed)
Patient Information:  Caller Name: Jerric  Phone: [phone]  Patient: Jeremiah Carpenter, Jeremiah Carpenter  Gender: Male  DOB: 11-Oct-1965  Age: 47 Years  PCP: Ruthe Mannan [facility])  Office Follow Up:  Does the office need to follow up with this patient?: No  Instructions For The Office: No appt available for Dr. Dayton Martes.   Scheduled with Dr. Sharen Hones   Symptoms  Reason For Call & Symptoms: Patient is having pain in his lower right side/ right middle abdominal pain.  Feels a 'swelling on the inside like you blew up a balloon".  Not constant. "Pressure feeling". Onset two days ago. You cannot visually see changes . Feels bloated. No new activity , no injury. He has has some scrotal discomfort but urinary changes. vasectomy 01/2012.  He has been on new cholesterol medication Lipitor  for two -three weeks.  Reviewed Health History In EMR: Yes  Reviewed Medications In EMR: Yes  Reviewed Allergies In EMR: Yes  Reviewed Surgeries / Procedures: Yes  Date of Onset of Symptoms: 06/03/2012  Guideline(s) Used:  Abdominal Pain - Male  Disposition Per Guideline:   See Today in Office  Reason For Disposition Reached:   Mild pain that comes and goes (cramps) lasts > 24 hours  Advice Given:  Fluids:  Sip clear fluids only (e.g., water, flat soft drinks or half-strength fruit juice) until the pain has been gone for over 2 hours. Then slowly return to a regular diet.  Diet:  Slowly advance diet from clear liquids to a bland diet  Avoid alcohol or caffeinated beverages  Avoid greasy or fatty foods  Avoid NSAIDS and Aspirin  : Avoid any drug that can irritate the stomach lining and make the pain worse (especially aspirin and NSAIDs like ibuprofen).  Call Back If:  You become worse.  Patient Will Follow Care Advice:  YES  Appointment Scheduled:  06/06/2012 15:30:00 Appointment Scheduled Provider:  Eustaquio Boyden [facility])

## 2012-07-01 ENCOUNTER — Encounter: Payer: Self-pay | Admitting: Family Medicine

## 2012-07-01 ENCOUNTER — Ambulatory Visit (INDEPENDENT_AMBULATORY_CARE_PROVIDER_SITE_OTHER): Payer: 59 | Admitting: Family Medicine

## 2012-07-01 VITALS — BP 110/70 | HR 84 | Temp 98.1°F | Ht 64.75 in | Wt 187.2 lb

## 2012-07-01 DIAGNOSIS — E7800 Pure hypercholesterolemia, unspecified: Secondary | ICD-10-CM

## 2012-07-01 LAB — COMPREHENSIVE METABOLIC PANEL
Albumin: 4.2 g/dL (ref 3.5–5.2)
Alkaline Phosphatase: 48 U/L (ref 39–117)
BUN: 17 mg/dL (ref 6–23)
CO2: 29 mEq/L (ref 19–32)
GFR: 74.94 mL/min (ref >60.00)
Glucose, Bld: 99 mg/dL (ref 70–99)
Total Bilirubin: 0.8 mg/dL (ref 0.3–1.2)

## 2012-07-01 LAB — LIPID PANEL
Cholesterol: 181 mg/dL (ref 0–200)
HDL: 35.4 mg/dL — ABNORMAL LOW (ref >39.00)
Total CHOL/HDL Ratio: 5
Triglycerides: 247 mg/dL — ABNORMAL HIGH (ref 0.0–149.0)
VLDL: 49.4 mg/dL — ABNORMAL HIGH (ref 0.0–40.0)

## 2012-07-01 NOTE — Progress Notes (Signed)
  Subjective:    Patient ID: Jeremiah Carpenter, male    DOB: Nov 13, 1965, 47 y.o.   MRN: [id number]  HPI  47 yo very pleasant male here for 8 week follow up HLD.  Lab Results  Component Value Date   CHOL 267* 04/30/2012   HDL 38.00* 04/30/2012   LDLDIRECT 190.1 04/30/2012   TRIG 193.0* 04/30/2012   CHOLHDL 7 04/30/2012   LDL very elevated in March.  Started Lipitor 10 mg daily and has been working on his diet and lifestyle.  Has not noticed any muscle aches or fatigue. Wt Readings from Last 3 Encounters:  07/01/12 187 lb 4 oz (84.936 kg)  06/06/12 185 lb (83.915 kg)  05/06/12 187 lb (84.823 kg)      Patient Active Problem List   Diagnosis Date Noted  . Abdominal pain, unspecified site 06/06/2012  . Low testosterone 05/06/2012  . Fatigue 09/03/2011  . HYPERCHOLESTEROLEMIA 06/21/2009  . PES PLANUS 05/18/2009   Past Medical History  Diagnosis Date  . Lyme disease    Past Surgical History  Procedure Laterality Date  . Carpal tunnel release      bilateral   History  Substance Use Topics  . Smoking status: Former Games developer  . Smokeless tobacco: Not on file  . Alcohol Use: No   Family History  Problem Relation Age of Onset  . Cancer Mother     lung   No Known Allergies Current Outpatient Prescriptions on File Prior to Visit  Medication Sig Dispense Refill  . atorvastatin (LIPITOR) 10 MG tablet Take 1 tablet (10 mg total) by mouth daily.  90 tablet  3  . Testosterone (AXIRON) 30 MG/ACT SOLN Place 1 application onto the skin every morning.       No current facility-administered medications on file prior to visit.     Review of Systems See HPI     Objective:   Physical Exam BP 110/70  Pulse 84  Temp(Src) 98.1 F (36.7 C) (Oral)  Ht 5' 4.75" (1.645 m)  Wt 187 lb 4 oz (84.936 kg)  BMI 31.39 kg/m2  SpO2 96%  General:  overweght male in NAD Eyes:  PERRL Ears:  External ear exam shows no significant lesions or deformities.  Otoscopic examination reveals clear canals,  tympanic membranes are intact bilaterally without bulging, retraction, inflammation or discharge. Hearing is grossly normal bilaterally. Nose:  External nasal examination shows no deformity or inflammation. Nasal mucosa are pink and moist without lesions or exudates. Mouth:  Oral mucosa and oropharynx without lesions or exudates.  Teeth in good repair. Neck:  no carotid bruit or thyromegaly no cervical or supraclavicular lymphadenopathy  Lungs:  Normal respiratory effort, chest expands symmetrically. Lungs are clear to auscultation, no crackles or wheezes. Heart:  Normal rate and regular rhythm. S1 and S2 normal without gallop, murmur, click, rub or other extra sounds. Extremities:  no edema        Assessment & Plan:    1. HYPERCHOLESTEROLEMIA Tolerating Lipitor well. Recheck labs today. The patient indicates understanding of these issues and agrees with the plan.  - Comprehensive metabolic panel - Lipid Panel

## 2012-07-03 ENCOUNTER — Encounter: Payer: Self-pay | Admitting: *Deleted

## 2012-07-04 ENCOUNTER — Telehealth: Payer: Self-pay

## 2012-07-04 NOTE — Telephone Encounter (Signed)
Pt called for recent lab results; pt wants to know since LDL has improved does pt need to continue Lipitor or not. Pt will continue dieting and when does Dr Dayton Martes want to recheck lab test. Pt request call back.

## 2012-07-04 NOTE — Telephone Encounter (Signed)
See result note.  

## 2012-07-04 NOTE — Telephone Encounter (Signed)
Advised patient of results.  

## 2012-10-06 ENCOUNTER — Encounter (HOSPITAL_COMMUNITY): Payer: Self-pay | Admitting: Emergency Medicine

## 2012-10-06 ENCOUNTER — Emergency Department (HOSPITAL_COMMUNITY)
Admission: EM | Admit: 2012-10-06 | Discharge: 2012-10-06 | Disposition: A | Discharge status: Nursing Home (Includes ICF) | Payer: 59 | Source: Home / Self Care

## 2012-10-06 ENCOUNTER — Telehealth: Payer: Self-pay | Admitting: Family Medicine

## 2012-10-06 ENCOUNTER — Emergency Department (HOSPITAL_COMMUNITY)
Admission: EM | Admit: 2012-10-06 | Discharge: 2012-10-06 | Disposition: A | Discharge status: Home / Self Care | Payer: 59 | Attending: Emergency Medicine | Admitting: Emergency Medicine

## 2012-10-06 ENCOUNTER — Emergency Department (HOSPITAL_COMMUNITY): Payer: 59

## 2012-10-06 DIAGNOSIS — R52 Pain, unspecified: Secondary | ICD-10-CM

## 2012-10-06 DIAGNOSIS — R319 Hematuria, unspecified: Secondary | ICD-10-CM

## 2012-10-06 DIAGNOSIS — Z79899 Other long term (current) drug therapy: Secondary | ICD-10-CM | POA: Insufficient documentation

## 2012-10-06 DIAGNOSIS — Z87891 Personal history of nicotine dependence: Secondary | ICD-10-CM | POA: Insufficient documentation

## 2012-10-06 DIAGNOSIS — R109 Unspecified abdominal pain: Secondary | ICD-10-CM

## 2012-10-06 LAB — POCT URINALYSIS DIP (DEVICE)
Ketones, ur: NEGATIVE mg/dL
Leukocytes, UA: NEGATIVE
Protein, ur: NEGATIVE mg/dL
pH: 5 (ref 5.0–8.0)

## 2012-10-06 LAB — CBC WITH DIFFERENTIAL/PLATELET
Eosinophils Absolute: 0.1 10*3/uL (ref 0.0–0.7)
HCT: 47.2 % (ref 39.0–52.0)
Hemoglobin: 16.6 g/dL (ref 13.0–17.0)
Lymphs Abs: 2 10*3/uL (ref 0.7–4.0)
MCH: 30.5 pg (ref 26.0–34.0)
MCV: 86.8 fL (ref 78.0–100.0)
Monocytes Absolute: 0.4 10*3/uL (ref 0.1–1.0)
Monocytes Relative: 6 % (ref 3–12)
Neutrophils Relative %: 65 % (ref 43–77)
RBC: 5.44 MIL/uL (ref 4.22–5.81)

## 2012-10-06 LAB — COMPREHENSIVE METABOLIC PANEL
Alkaline Phosphatase: 64 U/L (ref 39–117)
BUN: 13 mg/dL (ref 6–23)
Chloride: 103 mEq/L (ref 96–112)
GFR calc Af Amer: >90 mL/min (ref >90)
Glucose, Bld: 92 mg/dL (ref 70–99)
Potassium: 4.4 mEq/L (ref 3.5–5.1)
Total Bilirubin: 0.6 mg/dL (ref 0.3–1.2)
Total Protein: 7.9 g/dL (ref 6.0–8.3)

## 2012-10-06 NOTE — ED Provider Notes (Signed)
CSN: [id number]     Arrival date & time 10/06/12  1121 History     First MD Initiated Contact with Patient 10/06/12 1138     Chief Complaint  Patient presents with  . Abdominal Pain   (Consider location/radiation/quality/duration/timing/severity/associated sxs/prior Treatment) HPI Comments:  Patient is a 47 year old otherwise healthy male who was sent to the emergency department from urgent care for evaluation of possible kidney stone. Patient tells me that he has had pain in the right flank and right lower quadrant for the past 36 hours. He denies any fevers or chills. He denies any blood in the urine, dysuria or frequency.  Patient is a 47 y.o. male presenting with abdominal pain. The history is provided by the patient.  Abdominal Pain Pain location:  RLQ and R flank Pain quality: cramping   Pain radiates to:  Does not radiate Pain severity:  Moderate Onset quality:  Gradual Duration:  36 hours Timing:  Intermittent Progression:  Worsening Chronicity:  New Context comment:  None Relieved by:  Nothing Worsened by:  Nothing tried Ineffective treatments:  None tried   Past Medical History  Diagnosis Date  . Lyme disease    Past Surgical History  Procedure Laterality Date  . Carpal tunnel release      bilateral  . Bone graft surgery    . Vasectomy     Family History  Problem Relation Age of Onset  . Cancer Mother     lung   History  Substance Use Topics  . Smoking status: Former Games developer  . Smokeless tobacco: Not on file  . Alcohol Use: No    Review of Systems  Gastrointestinal: Positive for abdominal pain.  All other systems reviewed and are negative.    Allergies  Review of patient's allergies indicates no known allergies.  Home Medications   Current Outpatient Rx  Name  Route  Sig  Dispense  Refill  . atorvastatin (LIPITOR) 10 MG tablet   Oral   Take 10 mg by mouth at bedtime.         Marland Kitchen ibuprofen (ADVIL,MOTRIN) 200 MG tablet   Oral   Take  600-800 mg by mouth every 6 (six) hours as needed for pain.         . Testosterone 30 MG/ACT SOLN   Transdermal   Place 1 application onto the skin daily as needed (for testosterone).          BP 143/96  Pulse 71  Temp(Src) 97.6 F (36.4 C) (Oral)  Resp 18  SpO2 98% Physical Exam  Nursing note and vitals reviewed. Constitutional: He is oriented to person, place, and time. He appears well-developed and well-nourished. No distress.  HENT:  Head: Normocephalic and atraumatic.  Mouth/Throat: Oropharynx is clear and moist.  Neck: Normal range of motion. Neck supple.  Cardiovascular: Normal rate, regular rhythm and normal heart sounds.   No murmur heard. Pulmonary/Chest: Effort normal and breath sounds normal. No respiratory distress.  Abdominal: Soft. Bowel sounds are normal. He exhibits no distension. There is tenderness. There is no rebound and no guarding.  There is mild tenderness to palpation in the right lower quadrant with no rebound or guarding. There is also mild CVA tenderness.  Musculoskeletal: Normal range of motion. He exhibits no edema.  Neurological: He is alert and oriented to person, place, and time.  Skin: Skin is warm and dry. He is not diaphoretic.    ED Course   Procedures (including critical care time)  Labs Reviewed  CBC WITH DIFFERENTIAL  COMPREHENSIVE METABOLIC PANEL   No results found. No diagnosis found.  MDM  Patient presents here with complaints of right flank and right lower abdominal pain. He was seen in urgent care and sent here for further workup of possible kidney stone. He had trace blood in the urine at urgent care and laboratory studies here are completely unremarkable. He was sent for a CT of the abdomen and pelvis without contrast to rule out the possibility of stone. This was performed and was negative. He seems to be feeling somewhat better and seems appropriate for discharge. I discussed the results of these tests with him and he is  somewhat confused about what was causing this. He was reexamined and the abdomen remains benign. He has no white count and no findings of appendicitis on the CAT scan. There is also no evidence for other surgical pathology. I feel as though he is appropriate for discharge, to give time and when necessary follow.  Geoffery Lyons, MD 10/06/12 [phone]

## 2012-10-06 NOTE — ED Notes (Signed)
Pt returned from radiology.

## 2012-10-06 NOTE — Telephone Encounter (Signed)
It looks like he is at [facility] now per epic

## 2012-10-06 NOTE — ED Notes (Addendum)
Pt was seen at [facility] and was sent to ED for possible kidney stone. Pt c/o RLQ pain since Saturday. Denies any N/V/D. Stated the pain is felt in his right side as well.

## 2012-10-06 NOTE — Telephone Encounter (Signed)
Caller Name: Haider Phone: [phone] Patient: Jeremiah, Carpenter Gender: Male DOB: 04-Dec-1965 Age: 47 Years PCP: Ruthe Mannan [facility])  Does the office need to follow up with this patient?: N/A  RN Note: drinking more water, prefers to be seen at [facility]  Reason For Call & Symptoms: emergent call , abdominal pain, lower back, right side, comes to front, intermittent pain level of 10/10, low grade fever Reviewed Health History In EMR: Yes Reviewed Medications In EMR: Yes Reviewed Allergies In EMR: Yes Reviewed Surgeries / Procedures: Yes Date of Onset of Symptoms: 10/05/2012  Guideline(s) Used: Flank Pain  Disposition Per Guideline: Go to Office ED  Now  Reason For Disposition Reached:  Fever > 100.5 F (38.1 C)  Advice Given: Call Back If: You become worse.  Patient Will Follow Care Advice: YES

## 2012-10-06 NOTE — ED Notes (Signed)
Reports RLQ. Acute onset Saturday night. Pain is sharp. Intermittent. Some bloating. States pain is worse with getting up and down.  Some mild bloating. Normal Denies fever, n/v/d. BM normal. Pt has not tried any otc meds for pain.

## 2012-10-06 NOTE — ED Provider Notes (Signed)
CSN: [id number]     Arrival date & time 10/06/12  4098 History     First MD Initiated Contact with Patient 10/06/12 1023     Chief Complaint  Patient presents with  . Abdominal Pain    RLQ since saturday night. gradually getting worse. acute onset.     Patient is a 47 y.o. male presenting with flank pain. The history is provided by the patient.  Flank Pain This is a new problem. The current episode started 2 days ago. The problem occurs constantly. The problem has been gradually worsening. Associated symptoms include abdominal pain. Nothing aggravates the symptoms. Nothing relieves the symptoms.  Pt reports onset of (R) LBP Saturday that has worsened and now radiates around to (R) flank and (R) inguinal area. Has not noted any blood in his urine. Denies dysuria. Has been voiding more often but states has been drinking lots of water. Pain is constant though intensity varies. Reports pain a 9/10 at it's worse. Denies N/V/D or fever.  Past Medical History  Diagnosis Date  . Lyme disease    Past Surgical History  Procedure Laterality Date  . Carpal tunnel release      bilateral   Family History  Problem Relation Age of Onset  . Cancer Mother     lung   History  Substance Use Topics  . Smoking status: Former Games developer  . Smokeless tobacco: Not on file  . Alcohol Use: No    Review of Systems  Gastrointestinal: Positive for abdominal pain.  Genitourinary: Positive for flank pain.  All other systems reviewed and are negative.    Allergies  Review of patient's allergies indicates no known allergies.  Home Medications   Current Outpatient Rx  Name  Route  Sig  Dispense  Refill  . atorvastatin (LIPITOR) 10 MG tablet   Oral   Take 1 tablet (10 mg total) by mouth daily.   90 tablet   3   . Testosterone (AXIRON) 30 MG/ACT SOLN   Transdermal   Place 1 application onto the skin every morning.          BP 127/85  Pulse 74  Temp(Src) 98 F (36.7 C) (Oral)  Resp 16  SpO2  99% Physical Exam  Constitutional: He appears well-developed and well-nourished.  HENT:  Head: Normocephalic and atraumatic.  Eyes: Conjunctivae are normal.  Neck: Neck supple.  Cardiovascular: Normal rate.   Pulmonary/Chest: Effort normal.  Abdominal: Soft. Bowel sounds are normal. There is tenderness in the right lower quadrant. There is no rebound and no CVA tenderness.  Musculoskeletal: Normal range of motion.  Neurological: He is alert.  Skin: Skin is warm and dry.    ED Course  Symptoms concerning for renal colic/stone. U/A positive for blood. Does have mild TTP to RLQ but no rebound TTP. Appendicitis a possible consideration but feel less likely. Will send to [facility] for further evaluation to r/o kidney stone or other acute abd process.     Procedures (including critical care time)  Labs Reviewed  POCT URINALYSIS DIP (DEVICE) - Abnormal; Notable for the following:    Hgb urine dipstick SMALL (*)    All other components within normal limits   No results found. No diagnosis found.  MDM  (R) Flank pain. Symptoms concerning for renal colic/stone. U/A positive for blood. Does have mild TTP to RLQ but no rebound TTP. Appendicitis a possible consideration but feel less likely as no fever, N/V/D or anorexia. . Will send to [facility] for  further evaluation to r/o kidney stone or other acute abd process.      Leanne Chang, NP 10/06/12 1120

## 2012-10-09 NOTE — ED Provider Notes (Signed)
Medical screening examination/treatment/procedure(s) were performed by a resident physician or non-physician practitioner and as the supervising physician I was immediately available for consultation/collaboration.  Takeia Ciaravino, MD   Josaiah Muhammed S Lasundra Hascall, MD 10/09/12 1445 

## 2012-12-22 ENCOUNTER — Ambulatory Visit (INDEPENDENT_AMBULATORY_CARE_PROVIDER_SITE_OTHER): Payer: 59 | Admitting: Internal Medicine

## 2012-12-22 ENCOUNTER — Encounter: Payer: Self-pay | Admitting: Internal Medicine

## 2012-12-22 ENCOUNTER — Ambulatory Visit (INDEPENDENT_AMBULATORY_CARE_PROVIDER_SITE_OTHER)
Admission: RE | Admit: 2012-12-22 | Discharge: 2012-12-22 | Disposition: A | Discharge status: Home / Self Care | Payer: 59 | Source: Ambulatory Visit | Attending: Internal Medicine | Admitting: Internal Medicine

## 2012-12-22 VITALS — BP 128/90 | HR 70 | Temp 98.5°F | Wt 190.0 lb

## 2012-12-22 DIAGNOSIS — M79646 Pain in unspecified finger(s): Secondary | ICD-10-CM | POA: Insufficient documentation

## 2012-12-22 DIAGNOSIS — M79609 Pain in unspecified limb: Secondary | ICD-10-CM

## 2012-12-22 DIAGNOSIS — M79645 Pain in left finger(s): Secondary | ICD-10-CM

## 2012-12-22 NOTE — Progress Notes (Signed)
  Subjective:    Patient ID: Jeremiah Carpenter, male    DOB: [birth year], 47 y.o.   MRN: [id number]  HPI Playing football yesterday afternoon Jammed when he caught a pass Able to keep playing --another 20-30 minutes Still hurting then  Did ice several times Mild pain through the night Some sense of numbness Still throbbing  Tried ibuprofen 800mg  at bedtime Current Outpatient Prescriptions on File Prior to Visit  Medication Sig Dispense Refill  . atorvastatin (LIPITOR) 10 MG tablet Take 10 mg by mouth at bedtime.       No current facility-administered medications on file prior to visit.    No Known Allergies  Past Medical History  Diagnosis Date  . Lyme disease     Past Surgical History  Procedure Laterality Date  . Carpal tunnel release      bilateral  . Bone graft surgery    . Vasectomy      Family History  Problem Relation Age of Onset  . Cancer Mother     lung    History   Social History  . Marital Status: Married    Spouse Name: N/A    Number of Children: N/A  . Years of Education: N/A   Occupational History  . Not on file.   Social History Main Topics  . Smoking status: Former Games developer  . Smokeless tobacco: Never Used  . Alcohol Use: No  . Drug Use: No  . Sexual Activity: Yes    Birth Control/ Protection: Condom   Other Topics Concern  . Not on file   Social History Narrative  . No narrative on file    Review of Systems No fever No open areas    Objective:   Physical Exam  Constitutional: He appears well-developed and well-nourished. No distress.  Musculoskeletal:  Left 2nd finger--- normal extension but limited flexion except okay at MCP Tender at DIP only          Assessment & Plan:

## 2012-12-22 NOTE — Assessment & Plan Note (Signed)
X-ray doesn't show fracture but will await radiology Buddy taped ibuprofen

## 2013-03-20 ENCOUNTER — Encounter: Payer: Self-pay | Admitting: Radiology

## 2013-03-23 ENCOUNTER — Other Ambulatory Visit (INDEPENDENT_AMBULATORY_CARE_PROVIDER_SITE_OTHER): Payer: 59

## 2013-03-23 DIAGNOSIS — E7800 Pure hypercholesterolemia, unspecified: Secondary | ICD-10-CM

## 2013-03-23 LAB — LIPID PANEL
CHOL/HDL RATIO: 5
CHOLESTEROL: 185 mg/dL (ref 0–200)
HDL: 38.6 mg/dL — AB (ref >39.00)
LDL Cholesterol: 114 mg/dL — ABNORMAL HIGH (ref 0–99)
TRIGLYCERIDES: 163 mg/dL — AB (ref 0.0–149.0)
VLDL: 32.6 mg/dL (ref 0.0–40.0)

## 2013-03-23 LAB — COMPREHENSIVE METABOLIC PANEL
ALBUMIN: 4.4 g/dL (ref 3.5–5.2)
ALT: 31 U/L (ref 0–53)
AST: 26 U/L (ref 0–37)
Alkaline Phosphatase: 55 U/L (ref 39–117)
BUN: 16 mg/dL (ref 6–23)
CALCIUM: 9.6 mg/dL (ref 8.4–10.5)
CHLORIDE: 105 meq/L (ref 96–112)
CO2: 29 meq/L (ref 19–32)
CREATININE: 1.1 mg/dL (ref 0.4–1.5)
GFR: 76.3 mL/min (ref >60.00)
GLUCOSE: 89 mg/dL (ref 70–99)
POTASSIUM: 4.7 meq/L (ref 3.5–5.1)
Sodium: 139 mEq/L (ref 135–145)
Total Bilirubin: 0.8 mg/dL (ref 0.3–1.2)
Total Protein: 7.7 g/dL (ref 6.0–8.3)

## 2013-03-26 ENCOUNTER — Ambulatory Visit (INDEPENDENT_AMBULATORY_CARE_PROVIDER_SITE_OTHER): Payer: 59 | Admitting: Family Medicine

## 2013-03-26 ENCOUNTER — Encounter: Payer: Self-pay | Admitting: Family Medicine

## 2013-03-26 VITALS — BP 124/76 | HR 91 | Temp 97.8°F | Ht 64.0 in | Wt 186.5 lb

## 2013-03-26 DIAGNOSIS — Z0000 Encounter for general adult medical examination without abnormal findings: Secondary | ICD-10-CM | POA: Insufficient documentation

## 2013-03-26 DIAGNOSIS — E7800 Pure hypercholesterolemia, unspecified: Secondary | ICD-10-CM

## 2013-03-26 DIAGNOSIS — Z8781 Personal history of (healed) traumatic fracture: Secondary | ICD-10-CM

## 2013-03-26 DIAGNOSIS — R7989 Other specified abnormal findings of blood chemistry: Secondary | ICD-10-CM

## 2013-03-26 DIAGNOSIS — E291 Testicular hypofunction: Secondary | ICD-10-CM

## 2013-03-26 MED ORDER — ALPRAZOLAM 0.25 MG PO TABS: 0.2500 mg | ORAL_TABLET | Freq: Three times a day (TID) | ORAL | Status: AC | PRN

## 2013-03-26 MED ORDER — EPINEPHRINE 0.3 MG/0.3ML IJ SOAJ: 0.3000 mg | Freq: Once | INTRAMUSCULAR | Status: DC

## 2013-03-26 MED ORDER — ATORVASTATIN CALCIUM 10 MG PO TABS: 10.0000 mg | ORAL_TABLET | Freq: Every day | ORAL | Status: DC

## 2013-03-26 NOTE — Progress Notes (Signed)
Pre-visit discussion using our clinic review tool. No additional management support is needed unless otherwise documented below in the visit note.  

## 2013-03-26 NOTE — Patient Instructions (Addendum)
Great to see you. Please come back in after 04/08/2013 to have your labs drawn.  Come see me in 1 year.

## 2013-03-26 NOTE — Progress Notes (Signed)
Subjective:    Patient ID: Jeremiah Carpenter, male    DOB: 03-Jul-1965, 48 y.o.   MRN: [id number]  HPI  48 yo pleasant male here for CPX.    Overall doing very well.  He is planning a trip to [place] for his birthday in 10/2013.  He did have a vasectomy, needs to follow up with urology.   HLD- on Lipitor 10 mg daily since last year.  Lab Results  Component Value Date   CHOL 185 03/23/2013   HDL 38.60* 03/23/2013   LDLCALC 114* 03/23/2013   LDLDIRECT 103.5 07/01/2012   TRIG 163.0* 03/23/2013   CHOLHDL 5 03/23/2013   Weight has improved. Wt Readings from Last 3 Encounters:  03/26/13 186 lb 8 oz (84.596 kg)  12/22/12 190 lb (86.183 kg)  07/01/12 187 lb 4 oz (84.936 kg)   Does have strong FH of HLD and CAD.  Dad had MI in his 66s or 30s.      Low testosterone- feels much better on testosterone.  Will be due for labs next month.      Patient Active Problem List   Diagnosis Date Noted  . Routine general medical examination at a health care facility 03/26/2013  . Low testosterone 05/06/2012  . Fatigue 09/03/2011  . HYPERCHOLESTEROLEMIA 06/21/2009  . PES PLANUS 05/18/2009   Past Medical History  Diagnosis Date  . Lyme disease    Past Surgical History  Procedure Laterality Date  . Carpal tunnel release      bilateral  . Bone graft surgery    . Vasectomy     History  Substance Use Topics  . Smoking status: Former Research scientist (life sciences)  . Smokeless tobacco: Never Used  . Alcohol Use: No   Family History  Problem Relation Age of Onset  . Cancer Mother     lung   Allergies  Allergen Reactions  . Amoxicillin Hives and Rash   Current Outpatient Prescriptions on File Prior to Visit  Medication Sig Dispense Refill  . aspirin 81 MG tablet Take 81 mg by mouth daily.      Marland Kitchen atorvastatin (LIPITOR) 10 MG tablet Take 10 mg by mouth at bedtime.      Marland Kitchen testosterone (TESTIM) 50 MG/5GM GEL Place 5 g onto the skin daily.       No current facility-administered medications on file prior to  visit.     Review of Systems Patient reports no  vision/ hearing changes,anorexia, weight change, fever ,adenopathy, persistant / recurrent hoarseness, swallowing issues, chest pain, edema,persistant / recurrent cough, hemoptysis, dyspnea(rest, exertional, paroxysmal nocturnal), gastrointestinal  bleeding (melena, rectal bleeding), abdominal pain, excessive heart burn, GU symptoms(dysuria, hematuria, pyuria, voiding/incontinence  Issues) syncope, focal weakness, severe memory loss, concerning skin lesions, depression, anxiety, abnormal bruising/bleeding, major joint swelling.       Objective:   Physical Exam BP 124/76  Pulse 91  Temp(Src) 97.8 F (36.6 C) (Oral)  Ht 5\' 4"  (1.626 m)  Wt 186 lb 8 oz (84.596 kg)  BMI 32.00 kg/m2  SpO2 97% General:  overweght male in NAD Eyes:  PERRL Ears:  External ear exam shows no significant lesions or deformities.  Otoscopic examination reveals clear canals, tympanic membranes are intact bilaterally without bulging, retraction, inflammation or discharge. Hearing is grossly normal bilaterally. Nose:  External nasal examination shows no deformity or inflammation. Nasal mucosa are pink and moist without lesions or exudates. Mouth:  Oral mucosa and oropharynx without lesions or exudates.  Teeth in good repair. Neck:  no carotid bruit  or thyromegaly no cervical or supraclavicular lymphadenopathy  Lungs:  Normal respiratory effort, chest expands symmetrically. Lungs are clear to auscultation, no crackles or wheezes. Heart:  Normal rate and regular rhythm. S1 and S2 normal without gallop, murmur, click, rub or other extra sounds. Abdomen:  Bowel sounds positive,abdomen soft and non-tender without masses, organomegaly or hernias noted. Pulses:  R and L posterior tibial pulses are full and equal bilaterally  Extremities:  no edema    Assessment and Plan:

## 2013-03-26 NOTE — Assessment & Plan Note (Signed)
Reviewed preventive care protocols, scheduled due services, and updated immunizations Discussed nutrition, exercise, diet, and healthy lifestyle.  

## 2013-03-26 NOTE — Assessment & Plan Note (Signed)
On testosterone prescribed by urology but I will order labs for next month since he will be due. Orders Placed This Encounter  Procedures  . PSA  . CBC with Differential  . Testosterone  . Ambulatory referral to Hand Surgery

## 2013-04-09 ENCOUNTER — Other Ambulatory Visit: Payer: 59

## 2013-06-24 ENCOUNTER — Telehealth: Payer: Self-pay

## 2013-06-24 NOTE — Telephone Encounter (Signed)
Pt said to get decreased premium on his insurance he needs a letter stating he had CPX 03/26/13. Notified pt done and pt will pick up when has lab test.

## 2013-06-26 ENCOUNTER — Other Ambulatory Visit (INDEPENDENT_AMBULATORY_CARE_PROVIDER_SITE_OTHER): Payer: 59

## 2013-06-26 DIAGNOSIS — R7989 Other specified abnormal findings of blood chemistry: Secondary | ICD-10-CM

## 2013-06-26 DIAGNOSIS — R5381 Other malaise: Secondary | ICD-10-CM

## 2013-06-26 DIAGNOSIS — R5383 Other fatigue: Secondary | ICD-10-CM

## 2013-06-26 DIAGNOSIS — E291 Testicular hypofunction: Secondary | ICD-10-CM

## 2013-06-26 LAB — CBC WITH DIFFERENTIAL/PLATELET
BASOS ABS: 0 10*3/uL (ref 0.0–0.1)
BASOS PCT: 0.3 % (ref 0.0–3.0)
EOS ABS: 0.1 10*3/uL (ref 0.0–0.7)
Eosinophils Relative: 1.3 % (ref 0.0–5.0)
HEMATOCRIT: 46.5 % (ref 39.0–52.0)
HEMOGLOBIN: 15.8 g/dL (ref 13.0–17.0)
LYMPHS ABS: 1.9 10*3/uL (ref 0.7–4.0)
LYMPHS PCT: 30.7 % (ref 12.0–46.0)
MCHC: 34 g/dL (ref 30.0–36.0)
MCV: 89.7 fl (ref 78.0–100.0)
Monocytes Absolute: 0.5 10*3/uL (ref 0.1–1.0)
Monocytes Relative: 8.4 % (ref 3.0–12.0)
NEUTROS ABS: 3.6 10*3/uL (ref 1.4–7.7)
Neutrophils Relative %: 59.3 % (ref 43.0–77.0)
Platelets: 284 10*3/uL (ref 150.0–400.0)
RBC: 5.18 Mil/uL (ref 4.22–5.81)
RDW: 13.3 % (ref 11.5–14.6)
WBC: 6.1 10*3/uL (ref 4.5–10.5)

## 2013-06-26 LAB — PSA: PSA: 0.84 ng/mL (ref 0.10–4.00)

## 2013-06-26 LAB — TESTOSTERONE: Testosterone: 163.89 ng/dL — ABNORMAL LOW (ref 350.00–890.00)

## 2013-06-30 ENCOUNTER — Other Ambulatory Visit: Payer: Self-pay | Admitting: Family Medicine

## 2013-06-30 MED ORDER — TESTOSTERONE 50 MG/5GM (1%) TD GEL: 10.0000 g | Freq: Every day | TRANSDERMAL | Status: DC

## 2013-08-07 NOTE — Telephone Encounter (Signed)
Pt left v/m wanting to know if can pick up testosterone rx at front desk; left v/m advising pt testosterone rx still at front desk for pick up.

## 2013-11-10 ENCOUNTER — Ambulatory Visit: Payer: 59 | Admitting: Family Medicine

## 2013-11-10 DIAGNOSIS — Z0289 Encounter for other administrative examinations: Secondary | ICD-10-CM

## 2013-11-18 ENCOUNTER — Encounter: Payer: Self-pay | Admitting: Family Medicine

## 2013-11-18 ENCOUNTER — Encounter (INDEPENDENT_AMBULATORY_CARE_PROVIDER_SITE_OTHER): Payer: Self-pay

## 2013-11-18 ENCOUNTER — Ambulatory Visit (INDEPENDENT_AMBULATORY_CARE_PROVIDER_SITE_OTHER): Payer: 59 | Admitting: Family Medicine

## 2013-11-18 VITALS — BP 122/82 | HR 71 | Temp 98.0°F | Wt 191.8 lb

## 2013-11-18 DIAGNOSIS — E7800 Pure hypercholesterolemia, unspecified: Secondary | ICD-10-CM

## 2013-11-18 DIAGNOSIS — R7989 Other specified abnormal findings of blood chemistry: Secondary | ICD-10-CM

## 2013-11-18 DIAGNOSIS — E291 Testicular hypofunction: Secondary | ICD-10-CM

## 2013-11-18 DIAGNOSIS — R5383 Other fatigue: Secondary | ICD-10-CM

## 2013-11-18 DIAGNOSIS — R5381 Other malaise: Secondary | ICD-10-CM

## 2013-11-18 LAB — CBC WITH DIFFERENTIAL/PLATELET
BASOS PCT: 0.2 % (ref 0.0–3.0)
Basophils Absolute: 0 10*3/uL (ref 0.0–0.1)
EOS ABS: 0.1 10*3/uL (ref 0.0–0.7)
EOS PCT: 0.9 % (ref 0.0–5.0)
HEMATOCRIT: 47.8 % (ref 39.0–52.0)
Hemoglobin: 16 g/dL (ref 13.0–17.0)
LYMPHS ABS: 1.8 10*3/uL (ref 0.7–4.0)
Lymphocytes Relative: 21.1 % (ref 12.0–46.0)
MCHC: 33.5 g/dL (ref 30.0–36.0)
MCV: 90.6 fl (ref 78.0–100.0)
MONO ABS: 0.8 10*3/uL (ref 0.1–1.0)
Monocytes Relative: 9.6 % (ref 3.0–12.0)
Neutro Abs: 5.9 10*3/uL (ref 1.4–7.7)
Neutrophils Relative %: 68.2 % (ref 43.0–77.0)
PLATELETS: 304 10*3/uL (ref 150.0–400.0)
RBC: 5.28 Mil/uL (ref 4.22–5.81)
RDW: 13.9 % (ref 11.5–15.5)
WBC: 8.6 10*3/uL (ref 4.0–10.5)

## 2013-11-18 LAB — COMPREHENSIVE METABOLIC PANEL
ALBUMIN: 4.4 g/dL (ref 3.5–5.2)
ALK PHOS: 59 U/L (ref 39–117)
ALT: 23 U/L (ref 0–53)
AST: 24 U/L (ref 0–37)
BUN: 11 mg/dL (ref 6–23)
CHLORIDE: 103 meq/L (ref 96–112)
CO2: 28 mEq/L (ref 19–32)
Calcium: 9.7 mg/dL (ref 8.4–10.5)
Creatinine, Ser: 1 mg/dL (ref 0.4–1.5)
GFR: 85.04 mL/min (ref >60.00)
Glucose, Bld: 96 mg/dL (ref 70–99)
POTASSIUM: 4.5 meq/L (ref 3.5–5.1)
SODIUM: 139 meq/L (ref 135–145)
TOTAL PROTEIN: 7.9 g/dL (ref 6.0–8.3)
Total Bilirubin: 0.8 mg/dL (ref 0.2–1.2)

## 2013-11-18 LAB — LIPID PANEL
CHOL/HDL RATIO: 5
Cholesterol: 186 mg/dL (ref 0–200)
HDL: 39.8 mg/dL (ref >39.00)
NonHDL: 146.2
Triglycerides: 230 mg/dL — ABNORMAL HIGH (ref 0.0–149.0)
VLDL: 46 mg/dL — AB (ref 0.0–40.0)

## 2013-11-18 LAB — TESTOSTERONE: TESTOSTERONE: 541.24 ng/dL (ref 300.00–890.00)

## 2013-11-18 LAB — LDL CHOLESTEROL, DIRECT: Direct LDL: 120 mg/dL

## 2013-11-18 LAB — PSA: PSA: 0.96 ng/mL (ref 0.10–4.00)

## 2013-11-18 NOTE — Patient Instructions (Signed)
Great to see you. We will call you with your lab results.  Please call [facility] to make an appointment.

## 2013-11-18 NOTE — Progress Notes (Signed)
Subjective:    Patient ID: Jeremiah Carpenter, male    DOB: [birth year], 48 y.o.   MRN: [id number]  HPI  48 yo pleasant male here for follow up.  Overall doing very well.     HLD- on Lipitor 10 mg daily since last year.  Lab Results  Component Value Date   CHOL 185 03/23/2013   HDL 38.60* 03/23/2013   LDLCALC 114* 03/23/2013   LDLDIRECT 103.5 07/01/2012   TRIG 163.0* 03/23/2013   CHOLHDL 5 03/23/2013   Wt Readings from Last 3 Encounters:  11/18/13 191 lb 12 oz (86.977 kg)  03/26/13 186 lb 8 oz (84.596 kg)  12/22/12 190 lb (86.183 kg)   Does have strong FH of HLD and CAD.  Dad had MI in his 58s or 63s.      Low testosterone- he wants to talk about this today.  Feels he wants to try something other than topical rx. His wife does note that he is more fatigued and has less of a sex drive when he is not taking it.     Patient Active Problem List   Diagnosis Date Noted  . Low testosterone 05/06/2012  . Fatigue 09/03/2011  . HYPERCHOLESTEROLEMIA 06/21/2009  . PES PLANUS 05/18/2009   Past Medical History  Diagnosis Date  . Lyme disease    Past Surgical History  Procedure Laterality Date  . Carpal tunnel release      bilateral  . Bone graft surgery    . Vasectomy     History  Substance Use Topics  . Smoking status: Former Research scientist (life sciences)  . Smokeless tobacco: Never Used  . Alcohol Use: No   Family History  Problem Relation Age of Onset  . Cancer Mother     lung   Allergies  Allergen Reactions  . Amoxicillin Hives and Rash   Current Outpatient Prescriptions on File Prior to Visit  Medication Sig Dispense Refill  . aspirin 81 MG tablet Take 81 mg by mouth daily.      Marland Kitchen atorvastatin (LIPITOR) 10 MG tablet Take 1 tablet (10 mg total) by mouth at bedtime.  30 tablet  11  . EPINEPHrine (EPI-PEN) 0.3 mg/0.3 mL SOAJ injection Inject 0.3 mLs (0.3 mg total) into the muscle once.  1 Device  1  . testosterone (TESTIM) 50 MG/5GM (1%) GEL Place 10 g onto the skin daily.  1 Package  3    No current facility-administered medications on file prior to visit.     Review of Systems     Objective:   Physical Exam BP 122/82  Pulse 71  Temp(Src) 98 F (36.7 C) (Oral)  Wt 191 lb 12 oz (86.977 kg)  SpO2 97% General:  overweght male in NAD Eyes:  PERRL Ears:  External ear exam shows no significant lesions or deformities.  Otoscopic examination reveals clear canals, tympanic membranes are intact bilaterally without bulging, retraction, inflammation or discharge. Hearing is grossly normal bilaterally. Nose:  External nasal examination shows no deformity or inflammation. Nasal mucosa are pink and moist without lesions or exudates. Mouth:  Oral mucosa and oropharynx without lesions or exudates.  Teeth in good repair. Neck:  no carotid bruit or thyromegaly no cervical or supraclavicular lymphadenopathy  Lungs:  Normal respiratory effort, chest expands symmetrically. Lungs are clear to auscultation, no crackles or wheezes. Heart:  Normal rate and regular rhythm. S1 and S2 normal without gallop, murmur, click, rub or other extra sounds. Abdomen:  Bowel sounds positive,abdomen soft and non-tender without masses, organomegaly or  hernias noted. Pulses:  R and L posterior tibial pulses are full and equal bilaterally  Extremities:  no edema

## 2013-11-18 NOTE — Assessment & Plan Note (Signed)
>  25 minutes spent in face to face time with patient, >50% spent in counselling or coordination of care Explained that I do not prescribe injectable testosterone and he would prefer not to use that formulation after we talked about it. He would consider implants- he will make an appt with urology to discuss tx options. Check labs today. Orders Placed This Encounter  Procedures  . PSA  . CBC with Differential  . Testosterone, free  . Testosterone  . Lipid panel  . Comprehensive metabolic panel

## 2013-11-18 NOTE — Progress Notes (Signed)
Pre visit review using our clinic review tool, if applicable. No additional management support is needed unless otherwise documented below in the visit note. 

## 2013-11-18 NOTE — Assessment & Plan Note (Signed)
Stable. Continue current rx and check labs today.

## 2013-11-19 LAB — TESTOSTERONE, FREE, TOTAL, SHBG
Sex Hormone Binding: 16 nmol/L (ref 13–71)
TESTOSTERONE-% FREE: 3 % — AB (ref 1.6–2.9)
Testosterone, Free: 188.6 pg/mL (ref 47.0–244.0)
Testosterone: 625 ng/dL (ref 300–890)

## 2013-11-23 ENCOUNTER — Telehealth: Payer: Self-pay | Admitting: *Deleted

## 2013-11-23 MED ORDER — TESTOSTERONE 50 MG/5GM (1%) TD GEL: 10.0000 g | Freq: Every day | TRANSDERMAL | Status: DC

## 2013-11-23 NOTE — Telephone Encounter (Signed)
Pt requesting medication refill. Last f/u appt 09/2013-CPE. Ok to fill per Dr Deborra Medina. Rx to be faxed to requested pharmacy by end of day 11/24/13

## 2013-11-25 NOTE — Telephone Encounter (Signed)
Jennifer with [facility] left v/m; when pt came to pick up Testim advised pharmacist usually gets 2 boxes instead of 1 box. Jennifer request cb.

## 2013-11-25 NOTE — Telephone Encounter (Signed)
Spoke to pharmacist, Vita, and advised her that pt has ever received 2 boxes per med Hx. Rx was approved for 1box with refills as done previously

## 2014-03-18 ENCOUNTER — Other Ambulatory Visit: Payer: Self-pay | Admitting: Internal Medicine

## 2014-03-18 DIAGNOSIS — Z0000 Encounter for general adult medical examination without abnormal findings: Secondary | ICD-10-CM

## 2014-03-22 ENCOUNTER — Other Ambulatory Visit (INDEPENDENT_AMBULATORY_CARE_PROVIDER_SITE_OTHER): Payer: 59

## 2014-03-22 DIAGNOSIS — R790 Abnormal level of blood mineral: Secondary | ICD-10-CM

## 2014-03-22 DIAGNOSIS — Z0000 Encounter for general adult medical examination without abnormal findings: Secondary | ICD-10-CM

## 2014-03-22 LAB — CBC
HEMATOCRIT: 46.4 % (ref 39.0–52.0)
Hemoglobin: 15.6 g/dL (ref 13.0–17.0)
MCHC: 33.5 g/dL (ref 30.0–36.0)
MCV: 88.4 fl (ref 78.0–100.0)
Platelets: 279 10*3/uL (ref 150.0–400.0)
RBC: 5.25 Mil/uL (ref 4.22–5.81)
RDW: 13.2 % (ref 11.5–15.5)
WBC: 6.4 10*3/uL (ref 4.0–10.5)

## 2014-03-22 LAB — COMPREHENSIVE METABOLIC PANEL
ALBUMIN: 4.2 g/dL (ref 3.5–5.2)
ALK PHOS: 48 U/L (ref 39–117)
ALT: 24 U/L (ref 0–53)
AST: 19 U/L (ref 0–37)
BILIRUBIN TOTAL: 0.5 mg/dL (ref 0.2–1.2)
BUN: 17 mg/dL (ref 6–23)
CALCIUM: 9.5 mg/dL (ref 8.4–10.5)
CHLORIDE: 105 meq/L (ref 96–112)
CO2: 27 meq/L (ref 19–32)
Creatinine, Ser: 1 mg/dL (ref 0.40–1.50)
GFR: 83.94 mL/min (ref >60.00)
GLUCOSE: 102 mg/dL — AB (ref 70–99)
Potassium: 4.5 mEq/L (ref 3.5–5.1)
SODIUM: 139 meq/L (ref 135–145)
TOTAL PROTEIN: 6.9 g/dL (ref 6.0–8.3)

## 2014-03-22 LAB — TESTOSTERONE: TESTOSTERONE: 260.39 ng/dL — AB (ref 300.00–890.00)

## 2014-03-22 LAB — LIPID PANEL
CHOL/HDL RATIO: 5
Cholesterol: 187 mg/dL (ref 0–200)
HDL: 40.2 mg/dL (ref >39.00)
NONHDL: 146.8
TRIGLYCERIDES: 221 mg/dL — AB (ref 0.0–149.0)
VLDL: 44.2 mg/dL — ABNORMAL HIGH (ref 0.0–40.0)

## 2014-03-22 LAB — PSA: PSA: 0.93 ng/mL (ref 0.10–4.00)

## 2014-03-22 LAB — LDL CHOLESTEROL, DIRECT: LDL DIRECT: 114 mg/dL

## 2014-03-29 ENCOUNTER — Encounter: Payer: Self-pay | Admitting: Family Medicine

## 2014-03-29 ENCOUNTER — Ambulatory Visit (INDEPENDENT_AMBULATORY_CARE_PROVIDER_SITE_OTHER): Payer: 59 | Admitting: Family Medicine

## 2014-03-29 VITALS — BP 124/80 | HR 84 | Temp 98.3°F | Ht 64.5 in | Wt 188.5 lb

## 2014-03-29 DIAGNOSIS — E291 Testicular hypofunction: Secondary | ICD-10-CM

## 2014-03-29 DIAGNOSIS — Z1211 Encounter for screening for malignant neoplasm of colon: Secondary | ICD-10-CM

## 2014-03-29 DIAGNOSIS — R7989 Other specified abnormal findings of blood chemistry: Secondary | ICD-10-CM

## 2014-03-29 DIAGNOSIS — E7800 Pure hypercholesterolemia, unspecified: Secondary | ICD-10-CM

## 2014-03-29 DIAGNOSIS — Z0000 Encounter for general adult medical examination without abnormal findings: Secondary | ICD-10-CM

## 2014-03-29 MED ORDER — TESTOSTERONE 50 MG/5GM (1%) TD GEL: 10.0000 g | Freq: Every day | TRANSDERMAL | Status: DC

## 2014-03-29 NOTE — Progress Notes (Addendum)
Subjective:    Patient ID: Jeremiah Carpenter, male    DOB: 04/24/1965, 49 y.o.   MRN: [id number]  HPI  49 yo pleasant male here for CPX.   Doing well- 61 year old daughter getting her license soon.  He is looking for a car for her. Traveling a bit more for work but he feels he is handling this ok.  Has never had a colonoscopy.  No family h/o colon CA.  He denies change in his bowel habits or blood in his stool.  HLD- on Lipitor 10 mg daily.  Lab Results  Component Value Date   CHOL 187 03/22/2014   HDL 40.20 03/22/2014   LDLCALC 114* 03/23/2013   LDLDIRECT 114.0 03/22/2014   TRIG 221.0* 03/22/2014   CHOLHDL 5 03/22/2014   Weight has improved. Wt Readings from Last 3 Encounters:  03/29/14 188 lb 8 oz (85.503 kg)  11/18/13 191 lb 12 oz (86.977 kg)  03/26/13 186 lb 8 oz (84.596 kg)   Does have strong FH of HLD and CAD.  Dad had MI in his 4s or 51s.     Low testosterone- felt much better on testosterone but there was an issue with his rx last time- something with pharmacy and so he stopped taking it.  Has been tired but travels a lot for work, so it is difficult for him to tell where his fatigue is coming from. Denies decreased libido. Lab Results  Component Value Date   TESTOSTERONE 260.39* 03/22/2014   Lab Results  Component Value Date   ALT 24 03/22/2014   AST 19 03/22/2014   ALKPHOS 48 03/22/2014   BILITOT 0.5 03/22/2014   Lab Results  Component Value Date   PSA 0.93 03/22/2014   PSA 0.96 11/18/2013   PSA 0.84 06/26/2013   Lab Results  Component Value Date   WBC 6.4 03/22/2014   HGB 15.6 03/22/2014   HCT 46.4 03/22/2014   MCV 88.4 03/22/2014   PLT 279.0 03/22/2014        Patient Active Problem List   Diagnosis Date Noted  . Visit for well man health check 03/29/2014  . Low testosterone 05/06/2012  . Fatigue 09/03/2011  . HYPERCHOLESTEROLEMIA 06/21/2009  . PES PLANUS 05/18/2009   Past Medical History  Diagnosis Date  . Lyme disease    Past  Surgical History  Procedure Laterality Date  . Carpal tunnel release      bilateral  . Bone graft surgery    . Vasectomy     History  Substance Use Topics  . Smoking status: Former Research scientist (life sciences)  . Smokeless tobacco: Never Used  . Alcohol Use: No   Family History  Problem Relation Age of Onset  . Cancer Mother     lung   Allergies  Allergen Reactions  . Amoxicillin Hives and Rash   Current Outpatient Prescriptions on File Prior to Visit  Medication Sig Dispense Refill  . aspirin 81 MG tablet Take 81 mg by mouth daily.    Marland Kitchen atorvastatin (LIPITOR) 10 MG tablet Take 1 tablet (10 mg total) by mouth at bedtime. 30 tablet 11  . EPINEPHrine (EPI-PEN) 0.3 mg/0.3 mL SOAJ injection Inject 0.3 mLs (0.3 mg total) into the muscle once. 1 Device 1  . testosterone (TESTIM) 50 MG/5GM (1%) GEL Place 10 g onto the skin daily. 1 Package 3   No current facility-administered medications on file prior to visit.     Review of Systems Patient reports no  vision/ hearing changes,anorexia, weight change,  fever ,adenopathy, persistant / recurrent hoarseness, swallowing issues, chest pain, edema,persistant / recurrent cough, hemoptysis, dyspnea(rest, exertional, paroxysmal nocturnal), gastrointestinal  bleeding (melena, rectal bleeding), abdominal pain, excessive heart burn, GU symptoms(dysuria, hematuria, pyuria, voiding/incontinence  Issues) syncope, focal weakness, severe memory loss, concerning skin lesions, depression, anxiety, abnormal bruising/bleeding, major joint swelling.       Objective:   Physical Exam BP 124/80 mmHg  Pulse 84  Temp(Src) 98.3 F (36.8 C) (Oral)  Ht 5' 4.5" (1.638 m)  Wt 188 lb 8 oz (85.503 kg)  BMI 31.87 kg/m2  SpO2 96% General:  overweght male in NAD Eyes:  PERRL Ears:  External ear exam shows no significant lesions or deformities.  Otoscopic examination reveals clear canals, tympanic membranes are intact bilaterally without bulging, retraction, inflammation or discharge.  Hearing is grossly normal bilaterally. Nose:  External nasal examination shows no deformity or inflammation. Nasal mucosa are pink and moist without lesions or exudates. Mouth:  Oral mucosa and oropharynx without lesions or exudates.  Teeth in good repair. Neck:  no carotid bruit or thyromegaly no cervical or supraclavicular lymphadenopathy  Lungs:  Normal respiratory effort, chest expands symmetrically. Lungs are clear to auscultation, no crackles or wheezes. Heart:  Normal rate and regular rhythm. S1 and S2 normal without gallop, murmur, click, rub or other extra sounds. Abdomen:  Bowel sounds positive,abdomen soft and non-tender without masses, organomegaly or hernias noted. Pulses:  R and L posterior tibial pulses are full and equal bilaterally  Extremities:  no edema

## 2014-03-29 NOTE — Progress Notes (Signed)
Pre visit review using our clinic review tool, if applicable. No additional management support is needed unless otherwise documented below in the visit note. 

## 2014-03-29 NOTE — Assessment & Plan Note (Signed)
Well controlled on current rx. No changes made. 

## 2014-03-29 NOTE — Assessment & Plan Note (Signed)
Reviewed preventive care protocols, scheduled due services, and updated immunizations Discussed nutrition, exercise, diet, and healthy lifestyle.  Refer to GI for first screening colonoscopy.  Orders Placed This Encounter  Procedures  . Ambulatory referral to Gastroenterology

## 2014-03-29 NOTE — Assessment & Plan Note (Signed)
Deteriorated- stopped taking testosterone. Rx refilled today. CBC, PSA, CMET all reassuring. Follow up labs in 6 months. The patient indicates understanding of these issues and agrees with the plan.

## 2014-03-29 NOTE — Patient Instructions (Signed)
Good to see you. Have a wonderful trip.  Your labs look good- please come back in about 6 months for follow up labs.

## 2014-03-30 ENCOUNTER — Encounter: Payer: Self-pay | Admitting: Internal Medicine

## 2014-03-30 NOTE — Addendum Note (Signed)
Addended by: Lucille Passy on: 03/30/2014 07:25 AM   Modules accepted: Miquel Dunn

## 2014-04-01 ENCOUNTER — Other Ambulatory Visit: Payer: Self-pay | Admitting: Family Medicine

## 2014-04-30 ENCOUNTER — Ambulatory Visit (AMBULATORY_SURGERY_CENTER): Payer: Self-pay

## 2014-04-30 VITALS — Ht 65.5 in | Wt 190.0 lb

## 2014-04-30 DIAGNOSIS — Z1211 Encounter for screening for malignant neoplasm of colon: Secondary | ICD-10-CM

## 2014-04-30 NOTE — Progress Notes (Signed)
No allergies to eggs or soy No diet/weight loss meds No home oxygen No past problems with anesthesia  Has email  Emmi instructions given for colonoscopy 

## 2014-05-14 ENCOUNTER — Encounter: Payer: Self-pay | Admitting: Internal Medicine

## 2014-05-14 ENCOUNTER — Ambulatory Visit (AMBULATORY_SURGERY_CENTER): Payer: 59 | Admitting: Internal Medicine

## 2014-05-14 VITALS — BP 116/73 | HR 78 | Temp 97.9°F | Resp 17 | Ht 65.5 in | Wt 190.0 lb

## 2014-05-14 DIAGNOSIS — D122 Benign neoplasm of ascending colon: Secondary | ICD-10-CM | POA: Diagnosis not present

## 2014-05-14 DIAGNOSIS — Z1211 Encounter for screening for malignant neoplasm of colon: Secondary | ICD-10-CM

## 2014-05-14 MED ORDER — SODIUM CHLORIDE 0.9 % IV SOLN: 500.0000 mL | INTRAVENOUS | Status: DC

## 2014-05-14 NOTE — Progress Notes (Signed)
Called to room to assist during endoscopic procedure.  Patient ID and intended procedure confirmed with present staff. Received instructions for my participation in the procedure from the performing physician.  

## 2014-05-14 NOTE — Progress Notes (Signed)
To recovery, report to Scott, RN. VSS 

## 2014-05-14 NOTE — Op Note (Signed)
[facility]  [address] [place], [id number]   COLONOSCOPY PROCEDURE REPORT  PATIENT: Jeremiah, Carpenter  MR#: [id number] BIRTHDATE: March 20, 1965 , 48  yrs. old GENDER: male ENDOSCOPIST: Gatha Mayer, MD, [facility] PROCEDURE DATE:  05/14/2014 PROCEDURE:   Colonoscopy, screening and Colonoscopy with snare polypectomy First Screening Colonoscopy - Avg.  risk and is 50 yrs.  old or older Yes.  Prior Negative Screening - Now for repeat screening. N/A  History of Adenoma - Now for follow-up colonoscopy & has been > or = to 3 yrs.  N/A ASA CLASS:   Class II INDICATIONS:Screening for colonic neoplasia and Colorectal Neoplasm Risk Assessment for this procedure is average risk. MEDICATIONS: Propofol 300 mg IV and Monitored anesthesia care  DESCRIPTION OF PROCEDURE:   After the risks benefits and alternatives of the procedure were thoroughly explained, informed consent was obtained.  The digital rectal exam revealed no abnormalities of the rectum, revealed no prostatic nodules, and revealed the prostate was not enlarged.   The LB [id number] endoscope was introduced through the anus and advanced to the cecum, which was identified by both the appendix and ileocecal valve. No adverse events experienced.   The quality of the prep was (MiraLax was used) good.  The instrument was then slowly withdrawn as the colon was fully examined.      COLON FINDINGS: A sessile polyp measuring 5 mm in size was found in the ascending colon.  A polypectomy was performed with a cold snare.  The resection was complete, the polyp tissue was completely retrieved and sent to histology.   There was diverticulosis noted in the sigmoid colon.   The examination was otherwise normal. Retroflexed views revealed no abnormalities. The time to cecum = 1.9 Withdrawal time = 11.7   The scope was withdrawn and the procedure completed. COMPLICATIONS: There were no immediate complications.  ENDOSCOPIC  IMPRESSION: 1.   Sessile polyp was found in the ascending colon; polypectomy was performed with a cold snare 2.   Diverticulosis was noted in the sigmoid colon 3.   The examination was otherwise normal - good prep first screening  RECOMMENDATIONS: Timing of repeat colonoscopy will be determined by pathology findings.  eSigned:  Gatha Mayer, MD, [facility] 05/14/2014 2:01 PM   cc: The Patient and Arnette Norris, MD

## 2014-05-14 NOTE — Patient Instructions (Addendum)
I found and removed one small polyp that looks benign. You also have a condition called diverticulosis - common and not usually a problem. Please read the handout provided.  I will let you know pathology results and when to have another routine colonoscopy by mail.  I appreciate the opportunity to care for you. Gatha Mayer, MD, FACG  YOU HAD AN ENDOSCOPIC PROCEDURE TODAY AT [facility]:   Refer to the procedure report that was given to you for any specific questions about what was found during the examination.  If the procedure report does not answer your questions, please call your gastroenterologist to clarify.  If you requested that your care partner not be given the details of your procedure findings, then the procedure report has been included in a sealed envelope for you to review at your convenience later.  YOU SHOULD EXPECT: Some feelings of bloating in the abdomen. Passage of more gas than usual.  Walking can help get rid of the air that was put into your GI tract during the procedure and reduce the bloating. If you had a lower endoscopy (such as a colonoscopy or flexible sigmoidoscopy) you may notice spotting of blood in your stool or on the toilet paper. If you underwent a bowel prep for your procedure, you may not have a normal bowel movement for a few days.  Please Note:  You might notice some irritation and congestion in your nose or some drainage.  This is from the oxygen used during your procedure.  There is no need for concern and it should clear up in a day or so.  SYMPTOMS TO REPORT IMMEDIATELY:   Following lower endoscopy (colonoscopy or flexible sigmoidoscopy):  Excessive amounts of blood in the stool  Significant tenderness or worsening of abdominal pains  Swelling of the abdomen that is new, acute  Fever of 100F or higher    For urgent or emergent issues, a gastroenterologist can be reached at any hour by calling [phone]  [phone].   DIET: Your first meal following the procedure should be a small meal and then it is ok to progress to your normal diet. Heavy or fried foods are harder to digest and may make you feel nauseous or bloated.  Likewise, meals heavy in dairy and vegetables can increase bloating.  Drink plenty of fluids but you should avoid alcoholic beverages for 24 hours.  ACTIVITY:  You should plan to take it easy for the rest of today and you should NOT DRIVE or use heavy machinery until tomorrow (because of the sedation medicines used during the test).    FOLLOW UP: Our staff will call the number listed on your records the next business day following your procedure to check on you and address any questions or concerns that you may have regarding the information given to you following your procedure. If we do not reach you, we will leave a message.  However, if you are feeling well and you are not experiencing any problems, there is no need to return our call.  We will assume that you have returned to your regular daily activities without incident.  If any biopsies were taken you will be contacted by phone or by letter within the next 1-3 weeks.  Please call us at [phone] if you have not heard about the biopsies in 3 weeks.    SIGNATURES/CONFIDENTIALITY: You and/or your care partner have signed paperwork which will be entered into your electronic medical record.  These signatures attest to the fact that that the information above on your After Visit Summary has been reviewed and is understood.  Full responsibility of the confidentiality of this discharge information lies with you and/or your care-partner.  Diverticulosis and polyp information given.

## 2014-05-17 ENCOUNTER — Telehealth: Payer: Self-pay

## 2014-05-17 NOTE — Telephone Encounter (Signed)
Left message on answering machine. 

## 2014-05-19 ENCOUNTER — Encounter: Payer: Self-pay | Admitting: Internal Medicine

## 2014-05-19 DIAGNOSIS — Z86010 Personal history of colonic polyps: Secondary | ICD-10-CM | POA: Insufficient documentation

## 2014-05-19 NOTE — Progress Notes (Signed)
Quick Note:  Diminutive adenoma - repeat colon 2021 ______

## 2014-07-13 ENCOUNTER — Ambulatory Visit (INDEPENDENT_AMBULATORY_CARE_PROVIDER_SITE_OTHER): Payer: 59 | Admitting: Family Medicine

## 2014-07-13 ENCOUNTER — Encounter: Payer: Self-pay | Admitting: Family Medicine

## 2014-07-13 ENCOUNTER — Encounter (INDEPENDENT_AMBULATORY_CARE_PROVIDER_SITE_OTHER): Payer: Self-pay

## 2014-07-13 VITALS — BP 114/62 | HR 78 | Temp 98.3°F | Wt 193.0 lb

## 2014-07-13 DIAGNOSIS — R7989 Other specified abnormal findings of blood chemistry: Secondary | ICD-10-CM

## 2014-07-13 DIAGNOSIS — E291 Testicular hypofunction: Secondary | ICD-10-CM

## 2014-07-13 MED ORDER — TESTOSTERONE 50 MG/5GM (1%) TD GEL: 10.0000 g | Freq: Every day | TRANSDERMAL | Status: DC

## 2014-07-13 NOTE — Assessment & Plan Note (Signed)
Deteriorated. Rx refilled for Testim.  Recheck testosterone, PSA, CBC, CMET in 6 months. The patient indicates understanding of these issues and agrees with the plan.

## 2014-07-13 NOTE — Patient Instructions (Signed)
Good to see you. Please come back in 6 months.

## 2014-07-13 NOTE — Progress Notes (Signed)
Pre visit review using our clinic review tool, if applicable. No additional management support is needed unless otherwise documented below in the visit note. 

## 2014-07-13 NOTE — Progress Notes (Signed)
Subjective:   Patient ID: Jeremiah Carpenter, male    DOB: 1965/08/06, 49 y.o.   MRN: [id number]  Jeremiah Carpenter is a pleasant 49 y.o year old male who presents to clinic today with Follow-up  on 07/13/2014  HPI:  Low T- was on Testim but ran out of rx. Very fatigued lately and wife says he has been having more mood swings.  He is concerned that it is starting to impact his relationships with his daughters. Decreased sex drive.  Lab Results  Component Value Date   PSA 0.93 03/22/2014   PSA 0.96 11/18/2013   PSA 0.84 06/26/2013   Lab Results  Component Value Date   TESTOSTERONE 260.39* 03/22/2014   Lab Results  Component Value Date   WBC 6.4 03/22/2014   HGB 15.6 03/22/2014   HCT 46.4 03/22/2014   MCV 88.4 03/22/2014   PLT 279.0 03/22/2014     Current Outpatient Prescriptions on File Prior to Visit  Medication Sig Dispense Refill  . aspirin 81 MG tablet Take 81 mg by mouth daily.    Marland Kitchen atorvastatin (LIPITOR) 10 MG tablet TAKE ONE TABLET BY MOUTH NIGHTLY AT BEDTIME  30 tablet 4  . EPINEPHrine (EPI-PEN) 0.3 mg/0.3 mL SOAJ injection Inject 0.3 mLs (0.3 mg total) into the muscle once. 1 Device 1   No current facility-administered medications on file prior to visit.    Allergies  Allergen Reactions  . Amoxicillin Hives and Rash    Past Medical History  Diagnosis Date  . Lyme disease   . Hx of adenomatous polyp of colon 05/19/2014    Past Surgical History  Procedure Laterality Date  . Carpal tunnel release      bilateral  . Bone graft surgery    . Vasectomy      Family History  Problem Relation Age of Onset  . Cancer Mother     lung  . Colon cancer Neg Hx     History   Social History  . Marital Status: Married    Spouse Name: N/A  . Number of Children: N/A  . Years of Education: N/A   Occupational History  . Not on file.   Social History Main Topics  . Smoking status: Former Research scientist (life sciences)  . Smokeless tobacco: Never Used  . Alcohol Use: 0.0 oz/week      0 Standard drinks or equivalent per week     Comment: once monthly  . Drug Use: No  . Sexual Activity: Yes    Birth Control/ Protection: Condom   Other Topics Concern  . Not on file   Social History Narrative   The PMH, PSH, Social History, Family History, Medications, and allergies have been reviewed in [facility], and have been updated if relevant.   Review of Systems  Constitutional: Positive for fatigue.  Genitourinary: Negative for urgency and testicular pain.  Skin: Negative.   Neurological: Negative.   Hematological: Negative.   Psychiatric/Behavioral: Positive for agitation. Negative for suicidal ideas, behavioral problems, confusion, sleep disturbance, self-injury and dysphoric mood. The patient is not hyperactive.   All other systems reviewed and are negative.      Objective:    BP 114/62 mmHg  Pulse 78  Temp(Src) 98.3 F (36.8 C) (Oral)  Wt 193 lb (87.544 kg)  SpO2 97%   Physical Exam  Constitutional: He is oriented to person, place, and time. He appears well-developed and well-nourished. No distress.  HENT:  Head: Normocephalic.  Eyes: Conjunctivae are normal.  Cardiovascular: Normal rate.  Pulmonary/Chest: Effort normal.  Neurological: He is alert and oriented to person, place, and time. No cranial nerve deficit.  Skin: Skin is warm and dry.  Psychiatric: He has a normal mood and affect. His behavior is normal. Judgment and thought content normal.  Nursing note and vitals reviewed.         Assessment & Plan:   Low testosterone No Follow-up on file.

## 2014-08-31 ENCOUNTER — Encounter: Payer: Self-pay | Admitting: Family Medicine

## 2014-08-31 ENCOUNTER — Ambulatory Visit (INDEPENDENT_AMBULATORY_CARE_PROVIDER_SITE_OTHER): Payer: Commercial Managed Care - PPO | Admitting: Family Medicine

## 2014-08-31 ENCOUNTER — Other Ambulatory Visit: Payer: Self-pay | Admitting: *Deleted

## 2014-08-31 VITALS — BP 124/86 | HR 70 | Temp 98.1°F | Ht 64.0 in | Wt 195.0 lb

## 2014-08-31 DIAGNOSIS — R312 Other microscopic hematuria: Secondary | ICD-10-CM

## 2014-08-31 DIAGNOSIS — R3129 Other microscopic hematuria: Secondary | ICD-10-CM | POA: Insufficient documentation

## 2014-08-31 DIAGNOSIS — R319 Hematuria, unspecified: Secondary | ICD-10-CM

## 2014-08-31 LAB — POCT URINALYSIS DIPSTICK
BILIRUBIN UA: NEGATIVE
GLUCOSE UA: NEGATIVE
Ketones, UA: NEGATIVE
Leukocytes, UA: NEGATIVE
NITRITE UA: NEGATIVE
Protein, UA: NEGATIVE
SPEC GRAV UA: 1.015
Urobilinogen, UA: 0.2
pH, UA: 6

## 2014-08-31 LAB — POCT UA - MICROSCOPIC ONLY
BACTERIA, U MICROSCOPIC: 0
Casts, Ur, LPF, POC: 0
RBC, URINE, MICROSCOPIC: 0
Yeast, UA: 0

## 2014-08-31 MED ORDER — ATORVASTATIN CALCIUM 10 MG PO TABS: 10.0000 mg | ORAL_TABLET | Freq: Every day | ORAL | Status: DC

## 2014-08-31 NOTE — Patient Instructions (Addendum)
There is just a trace of blood on dip test today  Watch out for urinary symptoms  Will culture urine and get back to you Contact the travel clinic about your immunizations

## 2014-08-31 NOTE — Progress Notes (Signed)
Pre visit review using our clinic review tool, if applicable. No additional management support is needed unless otherwise documented below in the visit note. 

## 2014-08-31 NOTE — Progress Notes (Signed)
Subjective:    Patient ID: Jeremiah Carpenter, male    DOB: [birth year], 49 y.o.   MRN: [id number]  HPI Here for hematuria and question about immunization   He had a DOT physical  Did a urine dip test  Came back pos for blood in urine (high) - told to f/u   He has not seen blood in urine  He thinks he had a kidney stone last year - thinks he passed it while he was there  CT was clear  No symptoms since   No burning to urinate or frequency or urgency   No family hx of bladder or kidney problems   Results for orders placed or performed in visit on 08/31/14  POCT urinalysis dipstick  Result Value Ref Range   Color, UA Yellow    Clarity, UA Clear    Glucose, UA Neg.    Bilirubin, UA Neg.    Ketones, UA Neg.    Spec Grav, UA 1.015    Blood, UA Trace    pH, UA 6.0    Protein, UA Neg.    Urobilinogen, UA 0.2    Nitrite, UA Neg.    Leukocytes, UA Negative Negative   he does take 81 mg asa daily    Also had a question about immunizations  Given number for the travel clinic  ? Last Tetanus shot was poss in 2010 - ? Not Tdap he does not think  Is going to [place]  Has had hep B series Not hep A (he does not think)    Patient Active Problem List   Diagnosis Date Noted  . Hx of adenomatous polyp of colon 05/19/2014  . Visit for well man health check 03/29/2014  . Low testosterone 05/06/2012  . Fatigue 09/03/2011  . HYPERCHOLESTEROLEMIA 06/21/2009  . PES PLANUS 05/18/2009   Past Medical History  Diagnosis Date  . Lyme disease   . Hx of adenomatous polyp of colon 05/19/2014   Past Surgical History  Procedure Laterality Date  . Carpal tunnel release      bilateral  . Bone graft surgery    . Vasectomy     History  Substance Use Topics  . Smoking status: Former Research scientist (life sciences)  . Smokeless tobacco: Never Used  . Alcohol Use: 0.0 oz/week    0 Standard drinks or equivalent per week     Comment: once monthly   Family History  Problem Relation Age of Onset  . Cancer Mother       lung  . Colon cancer Neg Hx    Allergies  Allergen Reactions  . Amoxicillin Hives and Rash   Current Outpatient Prescriptions on File Prior to Visit  Medication Sig Dispense Refill  . aspirin 81 MG tablet Take 81 mg by mouth daily.    Marland Kitchen EPINEPHrine (EPI-PEN) 0.3 mg/0.3 mL SOAJ injection Inject 0.3 mLs (0.3 mg total) into the muscle once. 1 Device 1  . testosterone (TESTIM) 50 MG/5GM (1%) GEL Place 10 g onto the skin daily. 2 Package 5   No current facility-administered medications on file prior to visit.     Review of Systems Review of Systems  Constitutional: Negative for fever, appetite change, fatigue and unexpected weight change.  Eyes: Negative for pain and visual disturbance.  Respiratory: Negative for cough and shortness of breath.   Cardiovascular: Negative for cp or palpitations    Gastrointestinal: Negative for nausea, diarrhea and constipation.  Genitourinary: Negative for urgency and frequency. neg for flank pain/ dysuria or  gross hematuria  Skin: Negative for pallor or rash   Neurological: Negative for weakness, light-headedness, numbness and headaches.  Hematological: Negative for adenopathy. Does not bruise/bleed easily.  Psychiatric/Behavioral: Negative for dysphoric mood. The patient is not nervous/anxious.         Objective:   Physical Exam  Constitutional: He appears well-developed and well-nourished. No distress.  obese and well appearing   HENT:  Head: Normocephalic and atraumatic.  Eyes: Conjunctivae and EOM are normal. Pupils are equal, round, and reactive to light.  Neck: Normal range of motion. Neck supple.  Cardiovascular: Normal rate, regular rhythm and normal heart sounds.   Pulmonary/Chest: Effort normal and breath sounds normal.  Abdominal: Soft. Bowel sounds are normal. He exhibits no distension and no mass. There is no tenderness. There is no rebound and no guarding.  No cva tenderness No suprapubic tenderness or fullness     Musculoskeletal: He exhibits no edema.  Lymphadenopathy:    He has no cervical adenopathy.  Neurological: He is alert.  Skin: No rash noted.  Psychiatric: He has a normal mood and affect.          Assessment & Plan:   Problem List Items Addressed This Visit    Microscopic hematuria - Primary    Found on DOT physical -sent here for eval  No symptoms or pertinent hx  Now - just a trace on dip and none seen on spin  Urine sent for culture  If negative - will def to PCP re: repeating or other w/u        Relevant Orders   Urine culture   POCT UA - Microscopic Only (Completed)    Other Visit Diagnoses    Hematuria        Relevant Orders    POCT urinalysis dipstick (Completed)

## 2014-09-01 LAB — URINE CULTURE
Colony Count: NO GROWTH
ORGANISM ID, BACTERIA: NO GROWTH

## 2014-09-01 NOTE — Assessment & Plan Note (Signed)
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

## 2014-09-30 ENCOUNTER — Telehealth: Payer: Self-pay

## 2014-09-30 ENCOUNTER — Other Ambulatory Visit (INDEPENDENT_AMBULATORY_CARE_PROVIDER_SITE_OTHER): Payer: Commercial Managed Care - PPO

## 2014-09-30 DIAGNOSIS — R312 Other microscopic hematuria: Secondary | ICD-10-CM

## 2014-09-30 DIAGNOSIS — R3129 Other microscopic hematuria: Secondary | ICD-10-CM

## 2014-09-30 LAB — URINALYSIS, ROUTINE W REFLEX MICROSCOPIC
Bilirubin Urine: NEGATIVE
Ketones, ur: NEGATIVE
Leukocytes, UA: NEGATIVE
Nitrite: NEGATIVE
PH: 6 (ref 5.0–8.0)
Specific Gravity, Urine: 1.02 (ref 1.000–1.030)
Total Protein, Urine: NEGATIVE
URINE GLUCOSE: NEGATIVE
Urobilinogen, UA: 0.2 (ref 0.0–1.0)

## 2014-09-30 MED ORDER — TESTOSTERONE 50 MG/5GM (1%) TD GEL: 10.0000 g | Freq: Every day | TRANSDERMAL | Status: DC

## 2014-09-30 NOTE — Telephone Encounter (Signed)
I did already enter it.

## 2014-09-30 NOTE — Telephone Encounter (Signed)
Ok to fax rx as printed.

## 2014-09-30 NOTE — Telephone Encounter (Signed)
Pt left note that Testim is no longer covered; pt request new med(possilby Androgel) [facility] and cb when done. Pt left copy of [facility] preferred formulary and placed in Dr Hulen Shouts in box.

## 2014-09-30 NOTE — Telephone Encounter (Signed)
He has never been on androgel before and so you'll have to enter it as a new Rx with new dosing and instructions

## 2014-09-30 NOTE — Telephone Encounter (Signed)
Rx faxed to requested pharmacy 

## 2014-10-01 LAB — URINE CULTURE
Colony Count: NO GROWTH
Organism ID, Bacteria: NO GROWTH

## 2014-10-06 MED ORDER — TESTOSTERONE 50 MG/5GM (1%) TD GEL: 10.0000 g | Freq: Every day | TRANSDERMAL | Status: DC

## 2014-10-06 NOTE — Addendum Note (Signed)
Addended by: Modena Nunnery on: 10/06/2014 11:03 AM   Modules accepted: Orders

## 2014-11-09 ENCOUNTER — Other Ambulatory Visit: Payer: Self-pay

## 2014-11-09 MED ORDER — ATORVASTATIN CALCIUM 10 MG PO TABS: 10.0000 mg | ORAL_TABLET | Freq: Every day | ORAL | Status: DC

## 2014-11-09 NOTE — Telephone Encounter (Signed)
Pt [facility]. Last annual exam 03/29/2014.advised pt refill done.

## 2015-01-05 ENCOUNTER — Other Ambulatory Visit: Payer: Self-pay | Admitting: Family Medicine

## 2015-01-05 DIAGNOSIS — R7989 Other specified abnormal findings of blood chemistry: Secondary | ICD-10-CM

## 2015-01-05 DIAGNOSIS — E7800 Pure hypercholesterolemia, unspecified: Secondary | ICD-10-CM

## 2015-01-13 ENCOUNTER — Other Ambulatory Visit: Payer: 59

## 2015-01-14 ENCOUNTER — Other Ambulatory Visit (INDEPENDENT_AMBULATORY_CARE_PROVIDER_SITE_OTHER): Payer: Commercial Managed Care - PPO

## 2015-01-14 DIAGNOSIS — E7800 Pure hypercholesterolemia, unspecified: Secondary | ICD-10-CM

## 2015-01-14 DIAGNOSIS — E291 Testicular hypofunction: Secondary | ICD-10-CM

## 2015-01-14 DIAGNOSIS — R7989 Other specified abnormal findings of blood chemistry: Secondary | ICD-10-CM

## 2015-01-14 LAB — CBC WITH DIFFERENTIAL/PLATELET
BASOS ABS: 0 10*3/uL (ref 0.0–0.1)
Basophils Relative: 0.4 % (ref 0.0–3.0)
EOS PCT: 1.1 % (ref 0.0–5.0)
Eosinophils Absolute: 0.1 10*3/uL (ref 0.0–0.7)
HEMATOCRIT: 49 % (ref 39.0–52.0)
Hemoglobin: 16.3 g/dL (ref 13.0–17.0)
LYMPHS ABS: 2.1 10*3/uL (ref 0.7–4.0)
LYMPHS PCT: 29.1 % (ref 12.0–46.0)
MCHC: 33.3 g/dL (ref 30.0–36.0)
MCV: 89.5 fl (ref 78.0–100.0)
MONOS PCT: 8 % (ref 3.0–12.0)
Monocytes Absolute: 0.6 10*3/uL (ref 0.1–1.0)
NEUTROS ABS: 4.3 10*3/uL (ref 1.4–7.7)
NEUTROS PCT: 61.4 % (ref 43.0–77.0)
Platelets: 288 10*3/uL (ref 150.0–400.0)
RBC: 5.47 Mil/uL (ref 4.22–5.81)
RDW: 13.7 % (ref 11.5–15.5)
WBC: 7.1 10*3/uL (ref 4.0–10.5)

## 2015-01-14 LAB — COMPREHENSIVE METABOLIC PANEL
ALT: 27 U/L (ref 0–53)
AST: 19 U/L (ref 0–37)
Albumin: 4.4 g/dL (ref 3.5–5.2)
Alkaline Phosphatase: 49 U/L (ref 39–117)
BILIRUBIN TOTAL: 1 mg/dL (ref 0.2–1.2)
BUN: 16 mg/dL (ref 6–23)
CHLORIDE: 104 meq/L (ref 96–112)
CO2: 28 meq/L (ref 19–32)
CREATININE: 1.1 mg/dL (ref 0.40–1.50)
Calcium: 9.6 mg/dL (ref 8.4–10.5)
GFR: 74.95 mL/min (ref >60.00)
GLUCOSE: 98 mg/dL (ref 70–99)
Potassium: 4.2 mEq/L (ref 3.5–5.1)
SODIUM: 141 meq/L (ref 135–145)
Total Protein: 7 g/dL (ref 6.0–8.3)

## 2015-01-14 LAB — PSA: PSA: 0.89 ng/mL (ref 0.10–4.00)

## 2015-01-14 LAB — LDL CHOLESTEROL, DIRECT: Direct LDL: 124 mg/dL

## 2015-01-14 LAB — LIPID PANEL
CHOL/HDL RATIO: 5
Cholesterol: 198 mg/dL (ref 0–200)
HDL: 39 mg/dL — AB (ref >39.00)
NONHDL: 159.06
Triglycerides: 257 mg/dL — ABNORMAL HIGH (ref 0.0–149.0)
VLDL: 51.4 mg/dL — AB (ref 0.0–40.0)

## 2015-01-17 ENCOUNTER — Encounter: Payer: Self-pay | Admitting: Family Medicine

## 2015-01-17 ENCOUNTER — Ambulatory Visit (INDEPENDENT_AMBULATORY_CARE_PROVIDER_SITE_OTHER): Payer: Commercial Managed Care - PPO | Admitting: Family Medicine

## 2015-01-17 VITALS — BP 130/90 | HR 84 | Temp 98.2°F | Ht 64.0 in | Wt 192.5 lb

## 2015-01-17 DIAGNOSIS — M6588 Other synovitis and tenosynovitis, other site: Secondary | ICD-10-CM

## 2015-01-17 DIAGNOSIS — R079 Chest pain, unspecified: Secondary | ICD-10-CM

## 2015-01-17 DIAGNOSIS — R200 Anesthesia of skin: Secondary | ICD-10-CM

## 2015-01-17 DIAGNOSIS — R208 Other disturbances of skin sensation: Secondary | ICD-10-CM

## 2015-01-17 DIAGNOSIS — M659 Synovitis and tenosynovitis, unspecified: Secondary | ICD-10-CM

## 2015-01-17 LAB — TESTOSTERONE, FREE, TOTAL, SHBG
Sex Hormone Binding: 18 nmol/L (ref 10–50)
TESTOSTERONE FREE: 409.8 pg/mL — AB (ref 47.0–244.0)
TESTOSTERONE-% FREE: 3.2 % — AB (ref 1.6–2.9)
Testosterone: 1266 ng/dL — ABNORMAL HIGH (ref 300–890)

## 2015-01-17 MED ORDER — PREDNISONE 20 MG PO TABS: ORAL_TABLET | ORAL | Status: DC

## 2015-01-17 NOTE — Progress Notes (Signed)
Pre visit review using our clinic review tool, if applicable. No additional management support is needed unless otherwise documented below in the visit note. 

## 2015-01-17 NOTE — Patient Instructions (Signed)

## 2015-01-17 NOTE — Progress Notes (Signed)
Dr. Frederico Hamman T. Cashton Hosley, MD, [facility] [place], [address] Phone: [phone] Fax: [phone]  01/17/2015  Patient: Jeremiah Carpenter, MRN: [id number], DOB: 1965/03/19, 49 y.o.  Primary Physician:  Arnette Norris, MD   Chief Complaint  Patient presents with  . Hand Pain    Left-Unable to make fist  . Chest Pain  . Dizziness   Subjective:   Jeremiah Carpenter is a 49 y.o. very pleasant male patient who presents with the following:  Multiple c/o;  Left hand pain, B CTS a few years ago. 2-3 weeks ago, and both heard a snapping sound. Fingers not working. Not abducting 3rd and 4th on the left.  Has been lightheaded. Stress work. Will come and go.  Mother in law passed away, and got out of breath and BP was very low.   Chest pain on going for months, more acute this past week.  Friday hurt all day, came and went over the weekend.   Left arm going numb. Know he has some neck problems.  Does not bother him with physical activity - not sure. Has not felt well. Not full blown dizzy.   Cardiac risk factors:  10 pack years High cholesterol  Some MJ BP normal ? Heart rhythm is of 2007, bad shock.   Supertherapeutic on testosterone.  Lab Results  Component Value Date   TESTOSTERONE 1266* 01/14/2015   Free testosterone 410 (HH)  Past Medical History, Surgical History, Social History, Family History, Problem List, Medications, and Allergies have been reviewed and updated if relevant.  Patient Active Problem List   Diagnosis Date Noted  . Microscopic hematuria 08/31/2014  . Hx of adenomatous polyp of colon 05/19/2014  . Visit for well man health check 03/29/2014  . Low testosterone 05/06/2012  . Fatigue 09/03/2011  . HYPERCHOLESTEROLEMIA 06/21/2009  . PES PLANUS 05/18/2009    Past Medical History  Diagnosis Date  . Lyme disease   . Hx of adenomatous polyp of colon 05/19/2014    Past Surgical History    Procedure Laterality Date  . Carpal tunnel release      bilateral  . Bone graft surgery    . Vasectomy      Social History   Social History  . Marital Status: Married    Spouse Name: N/A  . Number of Children: N/A  . Years of Education: N/A   Occupational History  . Not on file.   Social History Main Topics  . Smoking status: Former Research scientist (life sciences)  . Smokeless tobacco: Never Used  . Alcohol Use: 0.0 oz/week    0 Standard drinks or equivalent per week     Comment: once monthly  . Drug Use: No  . Sexual Activity: Yes    Birth Control/ Protection: Condom   Other Topics Concern  . Not on file   Social History Narrative    Family History  Problem Relation Age of Onset  . Cancer Mother     lung  . Colon cancer Neg Hx     Allergies  Allergen Reactions  . Amoxicillin Hives and Rash    Medication list reviewed and updated in full in [facility].  ROS: GEN: Acute illness details above GI: Tolerating PO intake GU: maintaining adequate hydration and urination Pulm: No SOB Interactive and getting along well at home.  Otherwise, ROS is as per the HPI.   Objective:   BP 130/90 mmHg  Pulse 84  Temp(Src) 98.2  F (36.8 C) (Oral)  Ht 5\' 4"  (1.626 m)  Wt 192 lb 8 oz (87.317 kg)  BMI 33.03 kg/m2   GEN: WDWN, NAD, Non-toxic, A & O x 3 HEENT: Atraumatic, Normocephalic. Neck supple. No masses, No LAD. Ears and Nose: No external deformity. CV: RRR, No M/G/R. No JVD. No thrill. No extra heart sounds. PULM: CTA B, no wheezes, crackles, rhonchi. No retractions. No resp. distress. No accessory muscle use. EXTR: No c/c/e NEURO Normal gait.  PSYCH: Normally interactive. Conversant. Not depressed or anxious appearing.  Calm demeanor.    Left hand: Nontender from a bony anatomy standpoint.  He is having some trouble making a full composite fist, and he does have some tenosynovitis on the flexor tendons on multiple, 2 through 4.  Extension causes some pain and she also has  some fullness and pain with palpation along his flexor tendons.  There is no bogginess or synovitis in any of his metacarpal joints.  Radiology: No results found.   Results for orders placed or performed in visit on 01/14/15  Testosterone, Free, Total, SHBG  Result Value Ref Range   Testosterone 1266 (H) 300 - 890 ng/dL   Sex Hormone Binding 18 10 - 50 nmol/L   Testosterone, Free 409.8 (H) 47.0 - 244.0 pg/mL   Testosterone-% Free 3.2 (H) 1.6 - 2.9 %  PSA  Result Value Ref Range   PSA 0.89 0.10 - 4.00 ng/mL  CBC with Differential/Platelet  Result Value Ref Range   WBC 7.1 4.0 - 10.5 K/uL   RBC 5.47 4.22 - 5.81 Mil/uL   Hemoglobin 16.3 13.0 - 17.0 g/dL   HCT 49.0 39.0 - 52.0 %   MCV 89.5 78.0 - 100.0 fl   MCHC 33.3 30.0 - 36.0 g/dL   RDW 13.7 11.5 - 15.5 %   Platelets 288.0 150.0 - 400.0 K/uL   Neutrophils Relative % 61.4 43.0 - 77.0 %   Lymphocytes Relative 29.1 12.0 - 46.0 %   Monocytes Relative 8.0 3.0 - 12.0 %   Eosinophils Relative 1.1 0.0 - 5.0 %   Basophils Relative 0.4 0.0 - 3.0 %   Neutro Abs 4.3 1.4 - 7.7 K/uL   Lymphs Abs 2.1 0.7 - 4.0 K/uL   Monocytes Absolute 0.6 0.1 - 1.0 K/uL   Eosinophils Absolute 0.1 0.0 - 0.7 K/uL   Basophils Absolute 0.0 0.0 - 0.1 K/uL  Lipid panel  Result Value Ref Range   Cholesterol 198 0 - 200 mg/dL   Triglycerides 257.0 (H) 0.0 - 149.0 mg/dL   HDL 39.00 (L) >39.00 mg/dL   VLDL 51.4 (H) 0.0 - 40.0 mg/dL   Total CHOL/HDL Ratio 5    NonHDL 159.06   Comprehensive metabolic panel  Result Value Ref Range   Sodium 141 135 - 145 mEq/L   Potassium 4.2 3.5 - 5.1 mEq/L   Chloride 104 96 - 112 mEq/L   CO2 28 19 - 32 mEq/L   Glucose, Bld 98 70 - 99 mg/dL   BUN 16 6 - 23 mg/dL   Creatinine, Ser 1.10 0.40 - 1.50 mg/dL   Total Bilirubin 1.0 0.2 - 1.2 mg/dL   Alkaline Phosphatase 49 39 - 117 U/L   AST 19 0 - 37 U/L   ALT 27 0 - 53 U/L   Total Protein 7.0 6.0 - 8.3 g/dL   Albumin 4.4 3.5 - 5.2 g/dL   Calcium 9.6 8.4 - 10.5 mg/dL   GFR  74.95 >60.00 mL/min  LDL cholesterol, direct  Result Value Ref Range   Direct LDL 124.0 mg/dL     Assessment and Plan:   Chest pain, unspecified chest pain type - Plan: EKG 12-Lead, Exercise Tolerance Test  Flexor tenosynovitis of finger  Left arm numbness   EKG: Normal sinus rhythm. Normal axis, normal R wave progression, No acute ST elevation or depression.   Multiple issues, but chest pain is moved to the #1.  Intermittent chest pain, some left arm numbness, he also has no neck history and he also is currently has some flexor tenosynovitis, which also could be causing some left hand issues.  Left-hand flexor tenosynovitis, multiple tendons, and oral prednisone.  Multiple cardiac risk factors, I'm going to have the patient go for an ETT.  I am concerned that the patient has a Free testosterone of 410 and a total testosterone of 1300 - caught after he left office. It looks like he recently changed from Testim to Androgel packets. There can be a absorption difference between products, and I would recommend pump for ease of adjustment. I will call patient myself this weekend and alert pcp for f/u care.   Follow-up: with primary care doctor   New Prescriptions   PREDNISONE (DELTASONE) 20 MG TABLET    2 tabs po for 5 days, then 1 tab po for 5 days   Modified Medications   No medications on file   Orders Placed This Encounter  Procedures  . Exercise Tolerance Test  . EKG 12-Lead    Signed,  Hannan Hutmacher T. Erabella Kuipers, MD   Patient's Medications  New Prescriptions   PREDNISONE (DELTASONE) 20 MG TABLET    2 tabs po for 5 days, then 1 tab po for 5 days  Previous Medications   ASPIRIN 81 MG TABLET    Take 81 mg by mouth daily.   ATORVASTATIN (LIPITOR) 10 MG TABLET    Take 1 tablet (10 mg total) by mouth at bedtime.   EPINEPHRINE (EPI-PEN) 0.3 MG/0.3 ML SOAJ INJECTION    Inject 0.3 mLs (0.3 mg total) into the muscle once.   TESTOSTERONE (ANDROGEL) 50 MG/5GM (1%) GEL    Place 10 g  onto the skin daily.  Modified Medications   No medications on file  Discontinued Medications   No medications on file

## 2015-01-19 ENCOUNTER — Ambulatory Visit (INDEPENDENT_AMBULATORY_CARE_PROVIDER_SITE_OTHER): Payer: Commercial Managed Care - PPO

## 2015-01-19 DIAGNOSIS — R079 Chest pain, unspecified: Secondary | ICD-10-CM

## 2015-01-24 ENCOUNTER — Telehealth: Payer: Self-pay | Admitting: Family Medicine

## 2015-01-24 ENCOUNTER — Other Ambulatory Visit: Payer: Self-pay | Admitting: Family Medicine

## 2015-01-24 DIAGNOSIS — E349 Endocrine disorder, unspecified: Secondary | ICD-10-CM

## 2015-01-24 NOTE — Telephone Encounter (Signed)
Pt calling about stress test that was done on Wednesday and further explanation of the message that dr copland left on Friday.  Pt states that he could not understand the whole message.  Please call back at [phone] Thank you

## 2015-01-24 NOTE — Telephone Encounter (Signed)
Patient returned Donna's call.  Please call him back at [phone].

## 2015-01-24 NOTE — Telephone Encounter (Signed)
Mr. Swecker notified as instructed by telephone.  Lab appointment scheduled for 03/21/2015 at 8:15 and follow up with Dr. Deborra Medina on 03/28/2015 at 8:00 am per Dr. Lorelei Pont.

## 2015-01-24 NOTE — Telephone Encounter (Signed)
Left message for Jeremiah Carpenter to return my call.

## 2015-01-24 NOTE — Telephone Encounter (Signed)
Put the Testosterone, free and total in. LMOM for patient to call and schedule a lab appt for 2 months(end of Jan)

## 2015-01-24 NOTE — Telephone Encounter (Signed)
Call:  Stress test results not back from cardiology - likely not read over thanksgiving. I will let him know as soon as I have them.  Testosterone level from 01/14/2015 came back very high, Total 1300 and Free Testosterone 400.  I suspect from changing brands.   He should cut back from 10 grams to 5 grams a day and recheck that level in 2 months, then follow-up with Dr. Deborra Medina, who I already discussed it with.     Terri: can you help schedule for a lab in 2 months Total and Free testosterone: E29.1, Future

## 2015-01-28 ENCOUNTER — Telehealth: Payer: Self-pay | Admitting: Family Medicine

## 2015-01-28 NOTE — Telephone Encounter (Signed)
Patient had a stress test done last week.  Patient is calling to get the results.  The results are under imaging.

## 2015-01-28 NOTE — Telephone Encounter (Signed)
Reviewed hard stress test data - i do not see a confirmed conclusion by one of the cardiologists, but i feel comfortable enough interpretting the raw data to call the patient myself.

## 2015-01-29 NOTE — Telephone Encounter (Signed)
Spoke to patient last night regarding favorable stress test, good recovery without chest pain. I looked at ETT myself, and it appears favorable, but i do not see a formal cardiology interpretation at this point, and he understands. If for some reason felt positive, we will call him.

## 2015-02-03 LAB — EXERCISE TOLERANCE TEST
CHL CUP MPHR: 148 {beats}/min
CHL CUP RESTING HR STRESS: 99 {beats}/min
CSEPED: 4 min
Estimated workload: 6.9 METS
Exercise duration (sec): 58 s
Peak HR: 148 {beats}/min
Percent HR: 87 %

## 2015-03-21 ENCOUNTER — Other Ambulatory Visit (INDEPENDENT_AMBULATORY_CARE_PROVIDER_SITE_OTHER): Payer: Commercial Managed Care - PPO

## 2015-03-21 DIAGNOSIS — E291 Testicular hypofunction: Secondary | ICD-10-CM

## 2015-03-21 DIAGNOSIS — E349 Endocrine disorder, unspecified: Secondary | ICD-10-CM

## 2015-03-22 LAB — TESTOSTERONE, FREE, TOTAL, SHBG
Sex Hormone Binding: 20 nmol/L (ref 10–50)
TESTOSTERONE-% FREE: 2.7 % (ref 1.6–2.9)
Testosterone, Free: 143.1 pg/mL (ref 47.0–244.0)
Testosterone: 527 ng/dL (ref 250–827)

## 2015-03-24 ENCOUNTER — Ambulatory Visit (INDEPENDENT_AMBULATORY_CARE_PROVIDER_SITE_OTHER): Payer: Commercial Managed Care - PPO | Admitting: Family Medicine

## 2015-03-24 ENCOUNTER — Encounter: Payer: Self-pay | Admitting: Family Medicine

## 2015-03-24 VITALS — BP 130/82 | HR 71 | Temp 98.1°F | Wt 194.2 lb

## 2015-03-24 DIAGNOSIS — E291 Testicular hypofunction: Secondary | ICD-10-CM | POA: Diagnosis not present

## 2015-03-24 DIAGNOSIS — R7989 Other specified abnormal findings of blood chemistry: Secondary | ICD-10-CM

## 2015-03-24 MED ORDER — TESTOSTERONE 50 MG/5GM (1%) TD GEL: 10.0000 g | Freq: Every day | TRANSDERMAL | Status: DC

## 2015-03-24 NOTE — Progress Notes (Signed)
Subjective:   Patient ID: Jeremiah Carpenter, male    DOB: [birth year], 50 y.o.   MRN: [id number]  Andron Beattie is a pleasant 50 y.o. year old male who presents to clinic today with Follow-up  on 03/24/2015  HPI:  Saw my partner, Dr. Lorelei Pont in 12/2014 for chest pain.  At that time his Free testosterone was 410 with a total testosterone of 1300.  He had recently changed from Testim to Androgel packets. Since there can be a absorption difference between products, Dr. Lorelei Pont recommend pump for ease of adjustment and advised to follow up with me here today.  Stress test was negative and has no recurrent chest pain.  Denies fatigue.  Overall feels good.  Lab Results  Component Value Date   TESTOSTERONE 527 03/21/2015    Current Outpatient Prescriptions on File Prior to Visit  Medication Sig Dispense Refill  . aspirin 81 MG tablet Take 81 mg by mouth daily.    Marland Kitchen atorvastatin (LIPITOR) 10 MG tablet Take 1 tablet (10 mg total) by mouth at bedtime. 90 tablet 1  . EPINEPHrine (EPI-PEN) 0.3 mg/0.3 mL SOAJ injection Inject 0.3 mLs (0.3 mg total) into the muscle once. 1 Device 1  . testosterone (ANDROGEL) 50 MG/5GM (1%) GEL Place 10 g onto the skin daily. 180 Package 0   No current facility-administered medications on file prior to visit.    Allergies  Allergen Reactions  . Amoxicillin Hives and Rash    Past Medical History  Diagnosis Date  . Lyme disease   . Hx of adenomatous polyp of colon 05/19/2014    Past Surgical History  Procedure Laterality Date  . Carpal tunnel release      bilateral  . Bone graft surgery    . Vasectomy      Family History  Problem Relation Age of Onset  . Cancer Mother     lung  . Colon cancer Neg Hx     Social History   Social History  . Marital Status: Married    Spouse Name: N/A  . Number of Children: N/A  . Years of Education: N/A   Occupational History  . Not on file.   Social History Main Topics  . Smoking status: Former Research scientist (life sciences)   . Smokeless tobacco: Never Used  . Alcohol Use: 0.0 oz/week    0 Standard drinks or equivalent per week     Comment: once monthly  . Drug Use: No  . Sexual Activity: Yes    Birth Control/ Protection: Condom   Other Topics Concern  . Not on file   Social History Narrative   The PMH, PSH, Social History, Family History, Medications, and allergies have been reviewed in [facility], and have been updated if relevant.       Review of Systems  Constitutional: Negative.   Genitourinary: Negative.   Musculoskeletal: Negative.   Skin: Negative.   Neurological: Negative.   Psychiatric/Behavioral: Negative.   All other systems reviewed and are negative.      Objective:    BP 130/82 mmHg  Pulse 71  Temp(Src) 98.1 F (36.7 C) (Oral)  Wt 194 lb 4 oz (88.111 kg)  SpO2 98%   Physical Exam  Constitutional: He is oriented to person, place, and time. He appears well-developed and well-nourished. No distress.  HENT:  Head: Normocephalic.  Eyes: Conjunctivae are normal.  Cardiovascular: Normal rate.   Pulmonary/Chest: Effort normal.  Musculoskeletal: Normal range of motion.  Neurological: He is alert and oriented to person, place,  and time. No cranial nerve deficit.  Skin: Skin is warm and dry. He is not diaphoretic.  Psychiatric: He has a normal mood and affect. His behavior is normal. Judgment and thought content normal.          Assessment & Plan:   Low testosterone No Follow-up on file.

## 2015-03-24 NOTE — Progress Notes (Signed)
Pre visit review using our clinic review tool, if applicable. No additional management support is needed unless otherwise documented below in the visit note. 

## 2015-03-24 NOTE — Assessment & Plan Note (Signed)
Improved.  He is pleased with new rx- seems to absorb into his skin better. Continue current dose of androgel- refill today. The patient indicates understanding of these issues and agrees with the plan.

## 2015-03-28 ENCOUNTER — Ambulatory Visit: Payer: Commercial Managed Care - PPO | Admitting: Family Medicine

## 2015-04-18 ENCOUNTER — Other Ambulatory Visit: Payer: Self-pay | Admitting: Family Medicine

## 2015-04-18 NOTE — Telephone Encounter (Signed)
Thank you for picking up on that.  Yes ok to refill.

## 2015-04-18 NOTE — Telephone Encounter (Signed)
See lab results 01/14/15. Patient was to discuss at office visit, but patient saw Dr. Lorelei Pont. Is it okay to refill?

## 2015-07-26 ENCOUNTER — Telehealth: Payer: Self-pay | Admitting: Family Medicine

## 2015-07-26 NOTE — Telephone Encounter (Signed)
TH scheduled pt on 07/27/15 with Dr Deborra Medina.

## 2015-07-26 NOTE — Telephone Encounter (Signed)
Patient Name: DELFIN CROTHERS DOB: 1965-09-03 Initial Comment Caller states been having severe dizziness since Fri, tension headache Nurse Assessment Nurse: Ronnald Ramp, RN, Miranda Date/Time (Eastern Time): 07/26/2015 [id number] AM Confirm and document reason for call. If symptomatic, describe symptoms. You must click the next button to save text entered. ---Caller states he has had a headache (feels like pressure in the back of his head) and dizziness since Friday. Has the patient traveled out of the country within the last 30 days? ---Not Applicable Does the patient have any new or worsening symptoms? ---Yes Will a triage be completed? ---Yes Related visit to physician within the last 2 weeks? ---No Does the PT have any chronic conditions? (i.e. diabetes, asthma, etc.) ---Yes List chronic conditions. ---Neck problems Is this a behavioral health or substance abuse call? ---No Guidelines Guideline Title Affirmed Question Affirmed Notes Headache [1] MODERATE headache (e.g., interferes with normal activities) AND [2] present > 24 hours AND [3] unexplained (Exceptions: analgesics not tried, typical migraine, or headache part of viral illness) Final Disposition User See Physician within 24 Hours Ronnald Ramp, RN, Miranda Comments Appt scheduled for tomorrow morning 5/31 at 9am with Dr. Deborra Medina Referrals REFERRED TO PCP OFFICE Disagree/Comply: Comply

## 2015-07-27 ENCOUNTER — Ambulatory Visit (INDEPENDENT_AMBULATORY_CARE_PROVIDER_SITE_OTHER)
Admission: RE | Admit: 2015-07-27 | Discharge: 2015-07-27 | Disposition: A | Discharge status: Home / Self Care | Payer: Commercial Managed Care - PPO | Source: Ambulatory Visit | Attending: Family Medicine | Admitting: Family Medicine

## 2015-07-27 ENCOUNTER — Encounter: Payer: Self-pay | Admitting: Family Medicine

## 2015-07-27 ENCOUNTER — Ambulatory Visit (INDEPENDENT_AMBULATORY_CARE_PROVIDER_SITE_OTHER): Payer: Commercial Managed Care - PPO | Admitting: Family Medicine

## 2015-07-27 VITALS — BP 122/84 | HR 70 | Temp 98.2°F | Wt 191.2 lb

## 2015-07-27 DIAGNOSIS — R519 Headache, unspecified: Secondary | ICD-10-CM | POA: Insufficient documentation

## 2015-07-27 DIAGNOSIS — R51 Headache: Secondary | ICD-10-CM

## 2015-07-27 DIAGNOSIS — G4459 Other complicated headache syndrome: Secondary | ICD-10-CM

## 2015-07-27 MED ORDER — DEXAMETHASONE SODIUM PHOSPHATE 10 MG/ML IJ SOLN
10.0000 mg | Freq: Once | INTRAMUSCULAR | Status: AC
  Administered 2015-07-27: 10 mg via INTRAMUSCULAR

## 2015-07-27 NOTE — Patient Instructions (Signed)
Good to see you. I will call you with your xray results. Please let me know where you had your MRI done.

## 2015-07-27 NOTE — Progress Notes (Signed)
Subjective:   Patient ID: Jeremiah Carpenter, male    DOB: 1965-07-02, 50 y.o.   MRN: [id number]  Jeremiah Carpenter is a pleasant 50 y.o. year old male who presents to clinic today with Headache and Dizziness  on 07/27/2015  HPI:  5 days ago, developed a severe headache/pressure in back of his head associated with dizziness.  Those symptoms have resolved mainly, now only having tingling down left arm.  Had same symptoms years ago.  Ortho did MRI (? [facility])- and told him he had  Cervical DDD.  Was on a job last week where he was looking up a lot.  Current Outpatient Prescriptions on File Prior to Visit  Medication Sig Dispense Refill  . aspirin 81 MG tablet Take 81 mg by mouth daily.    Marland Kitchen atorvastatin (LIPITOR) 10 MG tablet TAKE 1 TABLET AT BEDTIME 90 tablet 1  . EPINEPHrine (EPI-PEN) 0.3 mg/0.3 mL SOAJ injection Inject 0.3 mLs (0.3 mg total) into the muscle once. 1 Device 1  . testosterone (ANDROGEL) 50 MG/5GM (1%) GEL Place 10 g onto the skin daily. 180 Package 3   No current facility-administered medications on file prior to visit.    Allergies  Allergen Reactions  . Amoxicillin Hives and Rash    Past Medical History  Diagnosis Date  . Lyme disease   . Hx of adenomatous polyp of colon 05/19/2014    Past Surgical History  Procedure Laterality Date  . Carpal tunnel release      bilateral  . Bone graft surgery    . Vasectomy      Family History  Problem Relation Age of Onset  . Cancer Mother     lung  . Colon cancer Neg Hx     Social History   Social History  . Marital Status: Married    Spouse Name: N/A  . Number of Children: N/A  . Years of Education: N/A   Occupational History  . Not on file.   Social History Main Topics  . Smoking status: Former Research scientist (life sciences)  . Smokeless tobacco: Never Used  . Alcohol Use: 0.0 oz/week    0 Standard drinks or equivalent per week     Comment: once monthly  . Drug Use: No  . Sexual Activity: Yes    Birth Control/  Protection: Condom   Other Topics Concern  . Not on file   Social History Narrative   The PMH, PSH, Social History, Family History, Medications, and allergies have been reviewed in [facility], and have been updated if relevant.   Review of Systems  Constitutional: Negative.   Eyes: Negative.   Musculoskeletal: Negative.   Skin: Negative.   Neurological: Positive for dizziness, numbness and headaches. Negative for facial asymmetry.       Objective:    BP 122/84 mmHg  Pulse 70  Temp(Src) 98.2 F (36.8 C) (Oral)  Wt 191 lb 4 oz (86.75 kg)  SpO2 98%   Physical Exam  Constitutional: He is oriented to person, place, and time. He appears well-developed and well-nourished. No distress.  HENT:  Head: Normocephalic.  Eyes: Conjunctivae are normal.  Neck: Normal range of motion. Neck supple.  Cardiovascular: Normal rate.   Pulmonary/Chest: Effort normal.  Musculoskeletal: Normal range of motion.  Neurological: He is alert and oriented to person, place, and time. He has normal reflexes. No cranial nerve deficit.  Grip strength normal bilaterally  Skin: Skin is warm and dry. He is not diaphoretic.  Psychiatric: He has a normal  mood and affect. His behavior is normal. Judgment and thought content normal.  Nursing note and vitals reviewed.         Assessment & Plan:   Other complicated headache syndrome - Plan: DG Cervical Spine Complete No Follow-up on file.

## 2015-07-27 NOTE — Progress Notes (Signed)
Pre visit review using our clinic review tool, if applicable. No additional management support is needed unless otherwise documented below in the visit note. 

## 2015-07-27 NOTE — Assessment & Plan Note (Signed)
With symptoms consistent with cervical radiculopathy. Pt to request copy of MRI. IM decadron given in office to decrease inflammation. Neck xray today. Likely will need to refer back to ortho for repeat MRI/treatment options. The patient indicates understanding of these issues and agrees with the plan.

## 2015-07-28 ENCOUNTER — Other Ambulatory Visit: Payer: Self-pay | Admitting: Family Medicine

## 2015-07-28 DIAGNOSIS — M5030 Other cervical disc degeneration, unspecified cervical region: Secondary | ICD-10-CM

## 2015-07-28 NOTE — Telephone Encounter (Signed)
Pt dropped off mri results  In dr aron's in box

## 2015-07-28 NOTE — Telephone Encounter (Signed)
Pt returned your call He stated he going by his chiro to get disk and will bring it here tomorrow

## 2015-08-23 ENCOUNTER — Telehealth: Payer: Self-pay | Admitting: Family Medicine

## 2015-08-23 NOTE — Telephone Encounter (Signed)
Pt called - he had appt with [facility], and left before he was seen due to a long wait. He is asking for referral to a different doc.   cb number is [phone] Thank you

## 2015-08-23 NOTE — Telephone Encounter (Signed)
Jeremiah Carpenter, can you please help with this?  Thanks!

## 2015-08-24 NOTE — Telephone Encounter (Signed)
Spoke to pt. He was given a number by Dr. Lorin Mercy staff to call  back at [facility] for 1st available appt with Dr. Lorin Mercy. If he has any issues he will call me back. I apologized for his frustration. He appreciated the call.

## 2015-09-15 ENCOUNTER — Other Ambulatory Visit: Payer: Self-pay | Admitting: Orthopaedic Surgery

## 2015-09-15 DIAGNOSIS — M542 Cervicalgia: Secondary | ICD-10-CM

## 2015-09-19 ENCOUNTER — Ambulatory Visit (INDEPENDENT_AMBULATORY_CARE_PROVIDER_SITE_OTHER): Payer: Commercial Managed Care - PPO | Admitting: Family Medicine

## 2015-09-19 DIAGNOSIS — IMO0001 Reserved for inherently not codable concepts without codable children: Secondary | ICD-10-CM | POA: Insufficient documentation

## 2015-09-19 DIAGNOSIS — R030 Elevated blood-pressure reading, without diagnosis of hypertension: Secondary | ICD-10-CM

## 2015-09-19 MED ORDER — TESTOSTERONE 50 MG/5GM (1%) TD GEL: 10.0000 g | Freq: Every day | TRANSDERMAL | 3 refills | Status: DC

## 2015-09-19 NOTE — Progress Notes (Signed)
Pre visit review using our clinic review tool, if applicable. No additional management support is needed unless otherwise documented below in the visit note. 

## 2015-09-19 NOTE — Progress Notes (Signed)
Subjective:   Patient ID: Jeremiah Carpenter, male    DOB: 04/07/1965, 50 y.o.   MRN: [id number]  Jeremiah Carpenter is a pleasant 50 y.o. year old male who presents to clinic today with Hypertension (156/90 last week)  on 09/19/2015  HPI:  ? HTN- BP was elevated at ortho office last week- 150s/90s. Has had intermittent dizziness but feels that has been due to his neck pain.  No HA or blurred vision.  Current Outpatient Prescriptions on File Prior to Visit  Medication Sig Dispense Refill  . aspirin 81 MG tablet Take 81 mg by mouth daily.    Marland Kitchen atorvastatin (LIPITOR) 10 MG tablet TAKE 1 TABLET AT BEDTIME 90 tablet 1  . EPINEPHrine (EPI-PEN) 0.3 mg/0.3 mL SOAJ injection Inject 0.3 mLs (0.3 mg total) into the muscle once. 1 Device 1   No current facility-administered medications on file prior to visit.     Allergies  Allergen Reactions  . Amoxicillin Hives and Rash    Past Medical History:  Diagnosis Date  . Hx of adenomatous polyp of colon 05/19/2014  . Lyme disease     Past Surgical History:  Procedure Laterality Date  . bone graft surgery    . CARPAL TUNNEL RELEASE     bilateral  . VASECTOMY      Family History  Problem Relation Age of Onset  . Cancer Mother     lung  . Colon cancer Neg Hx     Social History   Social History  . Marital status: Married    Spouse name: N/A  . Number of children: N/A  . Years of education: N/A   Occupational History  . Not on file.   Social History Main Topics  . Smoking status: Former Research scientist (life sciences)  . Smokeless tobacco: Never Used  . Alcohol use 0.0 oz/week     Comment: once monthly  . Drug use: No  . Sexual activity: Yes    Birth control/ protection: Condom   Other Topics Concern  . Not on file   Social History Narrative  . No narrative on file   The PMH, PSH, Social History, Family History, Medications, and allergies have been reviewed in [facility], and have been updated if relevant.   Review of Systems  Constitutional:  Negative.   Eyes: Negative.   Respiratory: Negative.   Cardiovascular: Negative.   Gastrointestinal: Negative.   Musculoskeletal: Positive for neck pain.  Neurological: Positive for dizziness. Negative for tremors, seizures, syncope, facial asymmetry, speech difficulty, weakness, light-headedness, numbness and headaches.  Hematological: Negative.   Psychiatric/Behavioral: Negative.   All other systems reviewed and are negative.      Objective:    BP 136/88   Pulse 79   Temp 98.3 F (36.8 C) (Oral)   Wt 194 lb 12 oz (88.3 kg)   SpO2 97%   BMI 33.43 kg/m   BP Readings from Last 3 Encounters:  09/19/15 136/88  07/27/15 122/84  03/24/15 130/82    Physical Exam  Constitutional: He is oriented to person, place, and time. He appears well-developed and well-nourished. No distress.  HENT:  Head: Normocephalic.  Eyes: Conjunctivae are normal.  Cardiovascular: Normal rate, regular rhythm and normal heart sounds.   Pulmonary/Chest: Effort normal and breath sounds normal. No respiratory distress. He has no wheezes.  Musculoskeletal: Normal range of motion.  Neurological: He is alert and oriented to person, place, and time. No cranial nerve deficit.  Skin: Skin is warm and dry. He is not diaphoretic.  Psychiatric: He has a normal mood and affect. His behavior is normal. Judgment and thought content normal.  Nursing note and vitals reviewed.         Assessment & Plan:   Elevated blood pressure No Follow-up on file.

## 2015-09-19 NOTE — Assessment & Plan Note (Signed)
Normotensive x 2 here. ? Secondary to pain vs ill fitting BP cuff previously. Reassurance provided. Follow up as needed. The patient indicates understanding of these issues and agrees with the plan.

## 2015-09-21 ENCOUNTER — Other Ambulatory Visit: Payer: Self-pay | Admitting: Orthopaedic Surgery

## 2015-09-21 ENCOUNTER — Ambulatory Visit
Admission: RE | Admit: 2015-09-21 | Discharge: 2015-09-21 | Disposition: A | Discharge status: Home / Self Care | Payer: Commercial Managed Care - PPO | Source: Ambulatory Visit | Attending: Orthopaedic Surgery | Admitting: Orthopaedic Surgery

## 2015-09-21 DIAGNOSIS — Z77018 Contact with and (suspected) exposure to other hazardous metals: Secondary | ICD-10-CM

## 2015-09-21 DIAGNOSIS — M542 Cervicalgia: Secondary | ICD-10-CM

## 2015-10-05 ENCOUNTER — Telehealth: Payer: Self-pay | Admitting: Family Medicine

## 2015-10-05 NOTE — Telephone Encounter (Signed)
Patient had a physical done by Dr.Aron in January.  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  Patient is asking for Dr.Aron to type a note stating patient had his physical and the date of the physical in the note. Patient is asking for the note to be mailed to his address.

## 2015-10-05 NOTE — Telephone Encounter (Signed)
Spoke to pt who states he is not needing letter if he did not have CPE

## 2015-10-05 NOTE — Telephone Encounter (Signed)
I am not able to write this letter as he did not have a CPE in 02/2015. This appt was a f/u only

## 2015-10-05 NOTE — Telephone Encounter (Signed)
Ok to write letter as requested and to send to his house.

## 2015-10-05 NOTE — Telephone Encounter (Signed)
Ok to write a note stating we followed up his medical conditions.  Please let pt know.

## 2015-10-15 ENCOUNTER — Other Ambulatory Visit: Payer: Self-pay | Admitting: Family Medicine

## 2016-01-02 ENCOUNTER — Other Ambulatory Visit (INDEPENDENT_AMBULATORY_CARE_PROVIDER_SITE_OTHER): Payer: Self-pay | Admitting: Orthopaedic Surgery

## 2016-01-03 NOTE — Pre-Procedure Instructions (Signed)
Darlene Bone  01/03/2016      [id number] [place] [address] [address], [place] [address] Phone: [phone] Fax: [phone]  [place], [place] [id number] [place] [address] Phone: [phone] Fax: [phone]  [place], [place] [address] [place] [address] Phone: [phone] Fax: [phone]  [facility] [place], [place] [address] [place] [address] Phone: [phone] Fax: [phone]    Your procedure is scheduled on Wed, Nov 15 @ 12:30 AM  Report to [facility] Admitting at 10:30 AM  Call this number if you have problems the morning of surgery:  [phone]   Remember:  Do not eat food or drink liquids after midnight.                Stop taking your Aspirin. No Goody's,BC's,Aleve,Advil,Motrin,Ibuprofen,Fish Oil,or any Herbal Medications.     Do not wear jewelry.  Do not wear lotions, powders,colognes, or deoderant.             Men may shave face and neck.  Do not bring valuables to the hospital.  [facility] is not responsible for any belongings or valuables.  Contacts, dentures or bridgework may not be worn into surgery.  Leave your suitcase in the car.  After surgery it may be brought to your room.  For patients admitted to the hospital, discharge time will be determined by your treatment team.  Patients discharged the day of surgery will not be allowed to drive home.    [facility]  Before surgery, you can play an important role.  Because skin is not sterile, your skin needs to be as free of germs as possible.  You can reduce the number of germs on you skin by washing with CHG (chlorahexidine gluconate) soap before surgery.  CHG is an antiseptic cleaner which kills germs  and bonds with the skin to continue killing germs even after washing.  Please DO NOT use if you have an allergy to CHG or antibacterial soaps.  If your skin becomes reddened/irritated stop using the CHG and inform your nurse when you arrive at Short Stay.  Do not shave (including legs and underarms) for at least 48 hours prior to the first CHG shower.  You may shave your face.  Please follow these instructions carefully:   1.  Shower with CHG Soap the night before surgery and the                                morning of Surgery.  2.  If you choose to wash your hair, wash your hair first as usual with your       normal shampoo.  3.  After you shampoo, rinse your hair and body thoroughly to remove the                      Shampoo.  4.  Use CHG as you would any other liquid soap.  You can apply chg directly       to the skin and wash gently with scrungie or a clean washcloth.  5.  Apply the CHG Soap to your body ONLY FROM THE  NECK DOWN.        Do not use on open wounds or open sores.  Avoid contact with your eyes,       ears, mouth and genitals (private parts).  Wash genitals (private parts)       with your normal soap.  6.  Wash thoroughly, paying special attention to the area where your surgery        will be performed.  7.  Thoroughly rinse your body with warm water from the neck down.  8.  DO NOT shower/wash with your normal soap after using and rinsing off       the CHG Soap.  9.  Pat yourself dry with a clean towel.            10.  Wear clean pajamas.            11.  Place clean sheets on your bed the night of your first shower and do not        sleep with pets.  Day of Surgery  Do not apply any lotions/deoderants the morning of surgery.  Please wear clean clothes to the hospital/surgery center.   Please read over the following fact sheets that you were given. Pain Booklet, Coughing and Deep Breathing, MRSA Information and Surgical Site Infection Prevention

## 2016-01-04 ENCOUNTER — Ambulatory Visit (HOSPITAL_COMMUNITY)
Admission: RE | Admit: 2016-01-04 | Discharge: 2016-01-04 | Disposition: A | Discharge status: Home / Self Care | Payer: Commercial Managed Care - PPO | Source: Ambulatory Visit | Attending: Surgery | Admitting: Surgery

## 2016-01-04 ENCOUNTER — Encounter (HOSPITAL_COMMUNITY)
Admission: RE | Admit: 2016-01-04 | Discharge: 2016-01-04 | Disposition: A | Discharge status: Home / Self Care | Payer: Commercial Managed Care - PPO | Source: Ambulatory Visit | Attending: Orthopaedic Surgery | Admitting: Orthopaedic Surgery

## 2016-01-04 ENCOUNTER — Encounter (HOSPITAL_COMMUNITY): Payer: Self-pay

## 2016-01-04 DIAGNOSIS — Z01818 Encounter for other preprocedural examination: Secondary | ICD-10-CM | POA: Diagnosis present

## 2016-01-04 DIAGNOSIS — Z01812 Encounter for preprocedural laboratory examination: Secondary | ICD-10-CM | POA: Insufficient documentation

## 2016-01-04 DIAGNOSIS — R531 Weakness: Secondary | ICD-10-CM | POA: Insufficient documentation

## 2016-01-04 DIAGNOSIS — Z86010 Personal history of colonic polyps: Secondary | ICD-10-CM | POA: Insufficient documentation

## 2016-01-04 DIAGNOSIS — G43909 Migraine, unspecified, not intractable, without status migrainosus: Secondary | ICD-10-CM | POA: Insufficient documentation

## 2016-01-04 DIAGNOSIS — E785 Hyperlipidemia, unspecified: Secondary | ICD-10-CM | POA: Diagnosis not present

## 2016-01-04 DIAGNOSIS — Z87442 Personal history of urinary calculi: Secondary | ICD-10-CM | POA: Insufficient documentation

## 2016-01-04 DIAGNOSIS — M2550 Pain in unspecified joint: Secondary | ICD-10-CM | POA: Diagnosis not present

## 2016-01-04 HISTORY — DX: Personal history of urinary calculi: Z87.442

## 2016-01-04 HISTORY — DX: Personal history of colon polyps, unspecified: Z86.0100

## 2016-01-04 HISTORY — DX: Personal history of other diseases of the nervous system and sense organs: Z86.69

## 2016-01-04 HISTORY — DX: Pain in unspecified joint: M25.50

## 2016-01-04 HISTORY — DX: Personal history of colonic polyps: Z86.010

## 2016-01-04 HISTORY — DX: Weakness: R53.1

## 2016-01-04 HISTORY — DX: Hyperlipidemia, unspecified: E78.5

## 2016-01-04 LAB — URINALYSIS, ROUTINE W REFLEX MICROSCOPIC
BILIRUBIN URINE: NEGATIVE
Glucose, UA: NEGATIVE mg/dL
Ketones, ur: NEGATIVE mg/dL
Leukocytes, UA: NEGATIVE
NITRITE: NEGATIVE
PROTEIN: NEGATIVE mg/dL
SPECIFIC GRAVITY, URINE: 1.013 (ref 1.005–1.030)
pH: 5.5 (ref 5.0–8.0)

## 2016-01-04 LAB — COMPREHENSIVE METABOLIC PANEL
ALBUMIN: 4.3 g/dL (ref 3.5–5.0)
ALK PHOS: 48 U/L (ref 38–126)
ALT: 26 U/L (ref 17–63)
ANION GAP: 8 (ref 5–15)
AST: 22 U/L (ref 15–41)
BUN: 14 mg/dL (ref 6–20)
CALCIUM: 9.6 mg/dL (ref 8.9–10.3)
CO2: 25 mmol/L (ref 22–32)
Chloride: 106 mmol/L (ref 101–111)
Creatinine, Ser: 1.03 mg/dL (ref 0.61–1.24)
GFR calc non Af Amer: >60 mL/min (ref >60)
GLUCOSE: 83 mg/dL (ref 65–99)
POTASSIUM: 4.3 mmol/L (ref 3.5–5.1)
SODIUM: 139 mmol/L (ref 135–145)
Total Bilirubin: 0.7 mg/dL (ref 0.3–1.2)
Total Protein: 6.8 g/dL (ref 6.5–8.1)

## 2016-01-04 LAB — SURGICAL PCR SCREEN
MRSA, PCR: NEGATIVE
STAPHYLOCOCCUS AUREUS: NEGATIVE

## 2016-01-04 LAB — URINE MICROSCOPIC-ADD ON: SQUAMOUS EPITHELIAL / LPF: NONE SEEN

## 2016-01-04 LAB — CBC
HCT: 44.4 % (ref 39.0–52.0)
HEMOGLOBIN: 15.3 g/dL (ref 13.0–17.0)
MCH: 30.4 pg (ref 26.0–34.0)
MCHC: 34.5 g/dL (ref 30.0–36.0)
MCV: 88.3 fL (ref 78.0–100.0)
PLATELETS: 258 10*3/uL (ref 150–400)
RBC: 5.03 MIL/uL (ref 4.22–5.81)
RDW: 12.6 % (ref 11.5–15.5)
WBC: 6.4 10*3/uL (ref 4.0–10.5)

## 2016-01-04 LAB — APTT: APTT: 26 s (ref 24–36)

## 2016-01-04 LAB — PROTIME-INR
INR: 1
Prothrombin Time: 13.2 seconds (ref 11.4–15.2)

## 2016-01-04 MED ORDER — CHLORHEXIDINE GLUCONATE 4 % EX LIQD: 60.0000 mL | Freq: Once | CUTANEOUS | Status: DC
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
## 2016-01-10 MED ORDER — VANCOMYCIN HCL IN DEXTROSE 1-5 GM/200ML-% IV SOLN
1000.0000 mg | INTRAVENOUS | Status: AC
  Administered 2016-01-11: 1000 mg via INTRAVENOUS
  Filled 2016-01-10 (×2): qty 200

## 2016-01-11 ENCOUNTER — Ambulatory Visit (HOSPITAL_COMMUNITY): Payer: Commercial Managed Care - PPO

## 2016-01-11 ENCOUNTER — Encounter (HOSPITAL_COMMUNITY)
Admission: RE | Disposition: A | Discharge status: Home / Self Care | Payer: Self-pay | Source: Ambulatory Visit | Attending: Orthopaedic Surgery

## 2016-01-11 ENCOUNTER — Observation Stay (HOSPITAL_COMMUNITY)
Admission: RE | Admit: 2016-01-11 | Discharge: 2016-01-12 | Disposition: A | Discharge status: Home / Self Care | Payer: Commercial Managed Care - PPO | Source: Ambulatory Visit | Attending: Orthopaedic Surgery | Admitting: Orthopaedic Surgery

## 2016-01-11 ENCOUNTER — Ambulatory Visit (HOSPITAL_COMMUNITY): Payer: Commercial Managed Care - PPO | Admitting: Certified Registered Nurse Anesthetist

## 2016-01-11 ENCOUNTER — Encounter (HOSPITAL_COMMUNITY): Payer: Self-pay | Admitting: Surgery

## 2016-01-11 DIAGNOSIS — Z87442 Personal history of urinary calculi: Secondary | ICD-10-CM | POA: Diagnosis not present

## 2016-01-11 DIAGNOSIS — M4802 Spinal stenosis, cervical region: Secondary | ICD-10-CM | POA: Diagnosis not present

## 2016-01-11 DIAGNOSIS — Z87891 Personal history of nicotine dependence: Secondary | ICD-10-CM | POA: Insufficient documentation

## 2016-01-11 DIAGNOSIS — Z86010 Personal history of colonic polyps: Secondary | ICD-10-CM | POA: Insufficient documentation

## 2016-01-11 DIAGNOSIS — M47812 Spondylosis without myelopathy or radiculopathy, cervical region: Principal | ICD-10-CM | POA: Insufficient documentation

## 2016-01-11 DIAGNOSIS — Z419 Encounter for procedure for purposes other than remedying health state, unspecified: Secondary | ICD-10-CM

## 2016-01-11 DIAGNOSIS — Z7982 Long term (current) use of aspirin: Secondary | ICD-10-CM | POA: Diagnosis not present

## 2016-01-11 DIAGNOSIS — Z880 Allergy status to penicillin: Secondary | ICD-10-CM | POA: Diagnosis not present

## 2016-01-11 DIAGNOSIS — E785 Hyperlipidemia, unspecified: Secondary | ICD-10-CM | POA: Insufficient documentation

## 2016-01-11 HISTORY — PX: ANTERIOR CERVICAL DECOMP/DISCECTOMY FUSION: SHX1161

## 2016-01-11 SURGERY — ANTERIOR CERVICAL DECOMPRESSION/DISCECTOMY FUSION 2 LEVELS
Anesthesia: General

## 2016-01-11 MED ORDER — HYDROMORPHONE HCL 2 MG/ML IJ SOLN
0.5000 mg | INTRAMUSCULAR | Status: DC | PRN
  Administered 2016-01-11: 1 mg via INTRAVENOUS
  Filled 2016-01-11: qty 1

## 2016-01-11 MED ORDER — FENTANYL CITRATE (PF) 100 MCG/2ML IJ SOLN
INTRAMUSCULAR | Status: AC
  Filled 2016-01-11: qty 4

## 2016-01-11 MED ORDER — SODIUM CHLORIDE 0.9% FLUSH: 3.0000 mL | INTRAVENOUS | Status: DC | PRN

## 2016-01-11 MED ORDER — ONDANSETRON HCL 4 MG/2ML IJ SOLN
4.0000 mg | INTRAMUSCULAR | Status: DC | PRN
  Administered 2016-01-11: 4 mg via INTRAVENOUS
  Filled 2016-01-11: qty 2

## 2016-01-11 MED ORDER — SUGAMMADEX SODIUM 200 MG/2ML IV SOLN
INTRAVENOUS | Status: AC
  Filled 2016-01-11: qty 2

## 2016-01-11 MED ORDER — ATORVASTATIN CALCIUM 10 MG PO TABS
10.0000 mg | ORAL_TABLET | Freq: Every day | ORAL | Status: DC
  Administered 2016-01-11: 10 mg via ORAL
  Filled 2016-01-11: qty 1

## 2016-01-11 MED ORDER — MIDAZOLAM HCL 5 MG/5ML IJ SOLN
INTRAMUSCULAR | Status: DC | PRN
  Administered 2016-01-11: 2 mg via INTRAVENOUS

## 2016-01-11 MED ORDER — PHENOL 1.4 % MT LIQD: 1.0000 | OROMUCOSAL | Status: DC | PRN

## 2016-01-11 MED ORDER — ACETAMINOPHEN 650 MG RE SUPP: 650.0000 mg | RECTAL | Status: DC | PRN

## 2016-01-11 MED ORDER — FENTANYL CITRATE (PF) 100 MCG/2ML IJ SOLN
INTRAMUSCULAR | Status: AC
  Filled 2016-01-11: qty 2

## 2016-01-11 MED ORDER — LIDOCAINE HCL (CARDIAC) 20 MG/ML IV SOLN
INTRAVENOUS | Status: DC | PRN
  Administered 2016-01-11: 100 mg via INTRAVENOUS

## 2016-01-11 MED ORDER — SODIUM CHLORIDE 0.9 % IV SOLN: INTRAVENOUS | Status: DC

## 2016-01-11 MED ORDER — DEXAMETHASONE SODIUM PHOSPHATE 10 MG/ML IJ SOLN
INTRAMUSCULAR | Status: DC | PRN
Start: 2016-01-11 — End: 2016-01-11
  Administered 2016-01-11: 10 mg via INTRAVENOUS

## 2016-01-11 MED ORDER — BUPIVACAINE HCL (PF) 0.25 % IJ SOLN
INTRAMUSCULAR | Status: DC | PRN
  Administered 2016-01-11: 10 mL

## 2016-01-11 MED ORDER — METHOCARBAMOL 500 MG PO TABS
500.0000 mg | ORAL_TABLET | Freq: Four times a day (QID) | ORAL | Status: DC | PRN
  Administered 2016-01-11: 500 mg via ORAL

## 2016-01-11 MED ORDER — FENTANYL CITRATE (PF) 100 MCG/2ML IJ SOLN
INTRAMUSCULAR | Status: DC | PRN
  Administered 2016-01-11: 50 ug via INTRAVENOUS
  Administered 2016-01-11 (×2): 100 ug via INTRAVENOUS
  Administered 2016-01-11 (×3): 50 ug via INTRAVENOUS

## 2016-01-11 MED ORDER — PROPOFOL 10 MG/ML IV BOLUS
INTRAVENOUS | Status: AC
  Filled 2016-01-11: qty 20

## 2016-01-11 MED ORDER — HYDROMORPHONE HCL 2 MG/ML IJ SOLN
INTRAMUSCULAR | Status: AC
  Filled 2016-01-11: qty 1

## 2016-01-11 MED ORDER — MIDAZOLAM HCL 2 MG/2ML IJ SOLN
INTRAMUSCULAR | Status: AC
Start: 2016-01-11 — End: 2016-01-11
  Filled 2016-01-11: qty 2

## 2016-01-11 MED ORDER — METHOCARBAMOL 500 MG PO TABS
ORAL_TABLET | ORAL | Status: AC
  Filled 2016-01-11: qty 1

## 2016-01-11 MED ORDER — POLYETHYLENE GLYCOL 3350 17 G PO PACK: 17.0000 g | PACK | Freq: Every day | ORAL | Status: DC | PRN

## 2016-01-11 MED ORDER — VANCOMYCIN HCL IN DEXTROSE 1-5 GM/200ML-% IV SOLN
1000.0000 mg | Freq: Two times a day (BID) | INTRAVENOUS | Status: DC
  Administered 2016-01-12: 1000 mg via INTRAVENOUS
  Filled 2016-01-11 (×3): qty 200

## 2016-01-11 MED ORDER — OXYCODONE-ACETAMINOPHEN 5-325 MG PO TABS
1.0000 | ORAL_TABLET | Freq: Four times a day (QID) | ORAL | Status: DC | PRN
  Administered 2016-01-11: 2 via ORAL
  Administered 2016-01-12 (×2): 1 via ORAL
  Administered 2016-01-12: 2 via ORAL
  Filled 2016-01-11 (×2): qty 1
  Filled 2016-01-11: qty 2

## 2016-01-11 MED ORDER — HYDROMORPHONE HCL 1 MG/ML IJ SOLN
0.2500 mg | INTRAMUSCULAR | Status: DC | PRN
  Administered 2016-01-11 (×4): 0.5 mg via INTRAVENOUS

## 2016-01-11 MED ORDER — DEXAMETHASONE SODIUM PHOSPHATE 10 MG/ML IJ SOLN
INTRAMUSCULAR | Status: AC
  Filled 2016-01-11: qty 1

## 2016-01-11 MED ORDER — PROPOFOL 10 MG/ML IV BOLUS
INTRAVENOUS | Status: AC
  Filled 2016-01-11: qty 40

## 2016-01-11 MED ORDER — DOCUSATE SODIUM 100 MG PO CAPS
100.0000 mg | ORAL_CAPSULE | Freq: Two times a day (BID) | ORAL | Status: DC
  Administered 2016-01-11 – 2016-01-12 (×2): 100 mg via ORAL
  Filled 2016-01-11 (×2): qty 1

## 2016-01-11 MED ORDER — PROPOFOL 10 MG/ML IV BOLUS
INTRAVENOUS | Status: DC | PRN
  Administered 2016-01-11: 30 mg via INTRAVENOUS
  Administered 2016-01-11: 140 mg via INTRAVENOUS

## 2016-01-11 MED ORDER — MENTHOL 3 MG MT LOZG: 1.0000 | LOZENGE | OROMUCOSAL | Status: DC | PRN

## 2016-01-11 MED ORDER — HEMOSTATIC AGENTS (NO CHARGE) OPTIME
TOPICAL | Status: DC | PRN
  Administered 2016-01-11: 1 via TOPICAL

## 2016-01-11 MED ORDER — SODIUM CHLORIDE 0.9 % IV SOLN: 250.0000 mL | INTRAVENOUS | Status: DC

## 2016-01-11 MED ORDER — ONDANSETRON HCL 4 MG/2ML IJ SOLN
INTRAMUSCULAR | Status: DC | PRN
  Administered 2016-01-11: 4 mg via INTRAVENOUS

## 2016-01-11 MED ORDER — HYDROMORPHONE HCL 1 MG/ML IJ SOLN: 0.2500 mg | INTRAMUSCULAR | Status: DC | PRN

## 2016-01-11 MED ORDER — TESTOSTERONE 50 MG/5GM (1%) TD GEL
10.0000 g | Freq: Every day | TRANSDERMAL | Status: DC
Start: 2016-01-11 — End: 2016-01-12
  Filled 2016-01-11: qty 10

## 2016-01-11 MED ORDER — BUPIVACAINE HCL (PF) 0.25 % IJ SOLN
INTRAMUSCULAR | Status: AC
  Filled 2016-01-11: qty 30

## 2016-01-11 MED ORDER — ROCURONIUM BROMIDE 100 MG/10ML IV SOLN
INTRAVENOUS | Status: DC | PRN
  Administered 2016-01-11: 50 mg via INTRAVENOUS

## 2016-01-11 MED ORDER — PHENYLEPHRINE 40 MCG/ML (10ML) SYRINGE FOR IV PUSH (FOR BLOOD PRESSURE SUPPORT)
PREFILLED_SYRINGE | INTRAVENOUS | Status: AC
  Filled 2016-01-11: qty 40

## 2016-01-11 MED ORDER — SUGAMMADEX SODIUM 200 MG/2ML IV SOLN
INTRAVENOUS | Status: DC | PRN
  Administered 2016-01-11: 200 mg via INTRAVENOUS

## 2016-01-11 MED ORDER — EPHEDRINE 5 MG/ML INJ
INTRAVENOUS | Status: AC
  Filled 2016-01-11: qty 20

## 2016-01-11 MED ORDER — 0.9 % SODIUM CHLORIDE (POUR BTL) OPTIME
TOPICAL | Status: DC | PRN
  Administered 2016-01-11: 1000 mL

## 2016-01-11 MED ORDER — METHOCARBAMOL 1000 MG/10ML IJ SOLN
500.0000 mg | Freq: Four times a day (QID) | INTRAVENOUS | Status: DC | PRN
  Filled 2016-01-11: qty 5

## 2016-01-11 MED ORDER — KETOROLAC TROMETHAMINE 30 MG/ML IJ SOLN
INTRAMUSCULAR | Status: DC | PRN
  Administered 2016-01-11: 30 mg via INTRAVENOUS

## 2016-01-11 MED ORDER — SODIUM CHLORIDE 0.9% FLUSH
3.0000 mL | Freq: Two times a day (BID) | INTRAVENOUS | Status: DC
  Administered 2016-01-12: 3 mL via INTRAVENOUS

## 2016-01-11 MED ORDER — ACETAMINOPHEN 325 MG PO TABS
650.0000 mg | ORAL_TABLET | ORAL | Status: DC | PRN
Start: 2016-01-11 — End: 2016-01-12
  Administered 2016-01-11 – 2016-01-12 (×2): 650 mg via ORAL
  Filled 2016-01-11 (×2): qty 2

## 2016-01-11 MED ORDER — LACTATED RINGERS IV SOLN
INTRAVENOUS | Status: DC
  Administered 2016-01-11 (×3): via INTRAVENOUS

## 2016-01-11 MED ORDER — OXYCODONE-ACETAMINOPHEN 5-325 MG PO TABS
ORAL_TABLET | ORAL | Status: AC
  Filled 2016-01-11: qty 2

## 2016-01-11 MED ORDER — ROCURONIUM BROMIDE 10 MG/ML (PF) SYRINGE
PREFILLED_SYRINGE | INTRAVENOUS | Status: AC
Start: 2016-01-11 — End: 2016-01-11
  Filled 2016-01-11: qty 20

## 2016-01-11 MED ORDER — HYDROMORPHONE HCL 1 MG/ML IJ SOLN: 0.5000 mg | INTRAMUSCULAR | Status: DC | PRN

## 2016-01-11 SURGICAL SUPPLY — 61 items
APL SKNCLS STERI-STRIP NONHPOA (GAUZE/BANDAGES/DRESSINGS) ×1
BENZOIN TINCTURE PRP APPL 2/3 (GAUZE/BANDAGES/DRESSINGS) ×3 IMPLANT
BIT DRILL SRG 14X2.2XFLT CHK (BIT) IMPLANT
BIT DRL SRG 14X2.2XFLT CHK (BIT) ×1
BLADE CLIPPER SURG (BLADE) ×2 IMPLANT
BLADE SURG ROTATE 9660 (MISCELLANEOUS) IMPLANT
BONE CERV LORDOTIC 14.5X12X6 (Bone Implant) ×6 IMPLANT
BUR ROUND FLUTED 4 SOFT TCH (BURR) IMPLANT
BUR ROUND FLUTED 4MM SOFT TCH (BURR)
CLOSURE WOUND 1/2 X4 (GAUZE/BANDAGES/DRESSINGS) ×1
COLLAR CERV LO CONTOUR FIRM DE (SOFTGOODS) ×2 IMPLANT
CORDS BIPOLAR (ELECTRODE) ×3 IMPLANT
COVER SURGICAL LIGHT HANDLE (MISCELLANEOUS) ×3 IMPLANT
CRADLE DONUT ADULT HEAD (MISCELLANEOUS) ×3 IMPLANT
DECANTER SPIKE VIAL GLASS SM (MISCELLANEOUS) ×2 IMPLANT
DRAPE C-ARM 42X72 X-RAY (DRAPES) ×3 IMPLANT
DRAPE MICROSCOPE LEICA (MISCELLANEOUS) ×3 IMPLANT
DRAPE PROXIMA HALF (DRAPES) ×3 IMPLANT
DRILL BIT SKYLINE 14MM (BIT) ×3
DURAPREP 6ML APPLICATOR 50/CS (WOUND CARE) ×3 IMPLANT
ELECT COATED BLADE 2.86 ST (ELECTRODE) ×3 IMPLANT
ELECT REM PT RETURN 9FT ADLT (ELECTROSURGICAL) ×3
ELECTRODE REM PT RTRN 9FT ADLT (ELECTROSURGICAL) ×1 IMPLANT
EVACUATOR 1/8 PVC DRAIN (DRAIN) ×3 IMPLANT
GAUZE SPONGE 4X4 12PLY STRL (GAUZE/BANDAGES/DRESSINGS) ×3 IMPLANT
GAUZE SPONGE 4X4 16PLY XRAY LF (GAUZE/BANDAGES/DRESSINGS) ×2 IMPLANT
GAUZE XEROFORM 1X8 LF (GAUZE/BANDAGES/DRESSINGS) IMPLANT
GLOVE BIOGEL PI IND STRL 8 (GLOVE) ×2 IMPLANT
GLOVE BIOGEL PI INDICATOR 8 (GLOVE) ×4
GLOVE ORTHO TXT STRL SZ7.5 (GLOVE) ×6 IMPLANT
GOWN STRL REUS W/ TWL LRG LVL3 (GOWN DISPOSABLE) ×1 IMPLANT
GOWN STRL REUS W/ TWL XL LVL3 (GOWN DISPOSABLE) ×1 IMPLANT
GOWN STRL REUS W/TWL 2XL LVL3 (GOWN DISPOSABLE) ×3 IMPLANT
GOWN STRL REUS W/TWL LRG LVL3 (GOWN DISPOSABLE) ×3
GOWN STRL REUS W/TWL XL LVL3 (GOWN DISPOSABLE) ×3
GRAFT BNE SPCR VG2 14.5X12X6 (Bone Implant) IMPLANT
HEAD HALTER (SOFTGOODS) ×3 IMPLANT
HEMOSTAT SURGICEL 2X14 (HEMOSTASIS) IMPLANT
KIT BASIN OR (CUSTOM PROCEDURE TRAY) ×3 IMPLANT
KIT ROOM TURNOVER OR (KITS) ×3 IMPLANT
MANIFOLD NEPTUNE II (INSTRUMENTS) IMPLANT
NDL 25GX 5/8IN NON SAFETY (NEEDLE) ×1 IMPLANT
NEEDLE 25GX 5/8IN NON SAFETY (NEEDLE) ×3 IMPLANT
NS IRRIG 1000ML POUR BTL (IV SOLUTION) ×3 IMPLANT
PACK ORTHO CERVICAL (CUSTOM PROCEDURE TRAY) ×3 IMPLANT
PAD ARMBOARD 7.5X6 YLW CONV (MISCELLANEOUS) ×6 IMPLANT
PATTIES SURGICAL .5 X.5 (GAUZE/BANDAGES/DRESSINGS) IMPLANT
PIN TEMP SKYLINE THREADED (PIN) ×2 IMPLANT
PLATE SKYLINE TWO LEVEL 32MM (Plate) ×2 IMPLANT
RESTRAINT LIMB HOLDER UNIV (RESTRAINTS) IMPLANT
SCREW VAR SELF TAP SKYLINE 14M (Screw) ×12 IMPLANT
SLEEVE SURGEON STRL (DRAPES) ×2 IMPLANT
STRIP CLOSURE SKIN 1/2X4 (GAUZE/BANDAGES/DRESSINGS) ×2 IMPLANT
SURGIFLO W/THROMBIN 8M KIT (HEMOSTASIS) ×2 IMPLANT
SUT BONE WAX W31G (SUTURE) ×3 IMPLANT
SUT VIC AB 3-0 X1 27 (SUTURE) ×3 IMPLANT
SUT VICRYL 4-0 PS2 18IN ABS (SUTURE) ×6 IMPLANT
SYR 30ML SLIP (SYRINGE) ×3 IMPLANT
TOWEL OR 17X24 6PK STRL BLUE (TOWEL DISPOSABLE) ×3 IMPLANT
TOWEL OR 17X26 10 PK STRL BLUE (TOWEL DISPOSABLE) ×3 IMPLANT
TRAY FOLEY CATH 16FR SILVER (SET/KITS/TRAYS/PACK) IMPLANT

## 2016-01-11 NOTE — Anesthesia Procedure Notes (Signed)
Procedure Name: Intubation Date/Time: 01/11/2016 1:38 PM Performed by: Oletta Lamas Pre-anesthesia Checklist: Patient identified, Emergency Drugs available, Suction available and Patient being monitored Patient Re-evaluated:Patient Re-evaluated prior to inductionOxygen Delivery Method: Circle System Utilized Preoxygenation: Pre-oxygenation with 100% oxygen Intubation Type: IV induction Ventilation: Mask ventilation without difficulty Laryngoscope Size: Mac and 4 Grade View: Grade II Tube type: Oral Number of attempts: 1 Airway Equipment and Method: Stylet Placement Confirmation: ETT inserted through vocal cords under direct vision,  positive ETCO2 and breath sounds checked- equal and bilateral Secured at: 23 cm Tube secured with: Tape Dental Injury: Teeth and Oropharynx as per pre-operative assessment

## 2016-01-11 NOTE — Progress Notes (Signed)
Orthopedic Tech Progress Note Patient Details:  HASSON HEIDINGER 27-Apr-1965 [id number]  Ortho Devices Type of Ortho Device: Soft collar Ortho Device/Splint Location: Neck (Patient already had collar however provide this one as and extra.)   Kristopher Oppenheim 01/11/2016, 7:58 PM

## 2016-01-11 NOTE — Progress Notes (Signed)
ANTIBIOTIC CONSULT NOTE - INITIAL  Pharmacy Consult for Vancomycin Indication: post-surgical drain  Allergies  Allergen Reactions  . Amoxicillin Hives and Rash    Has patient had a PCN reaction causing immediate rash, facial/tongue/throat swelling, SOB or lightheadedness with hypotension: no Has patient had a PCN reaction causing severe rash involving mucus membranes or skin necrosis:no Has patient had a PCN reaction that required hospitalization no Has patient had a PCN reaction occurring within the last 10 years: yes If all of the above answers are "NO", then may proceed with Cephalosporin use.    Patient Measurements: Weight: 191 lb (86.6 kg) Adjusted Body Weight:    Vital Signs: Temp: 97.9 F (36.6 C) (11/15 1044) Temp Source: Oral (11/15 1044) BP: 141/93 (11/15 1044) Pulse Rate: 77 (11/15 1044) Intake/Output from previous day: No intake/output data recorded. Intake/Output from this shift: Total I/O In: 1000 [I.V.:1000] Out: 75 [Blood:75]  Labs: No results for input(s): WBC, HGB, PLT, LABCREA, CREATININE in the last 72 hours. Estimated Creatinine Clearance: 84.8 mL/min (by C-G formula based on SCr of 1.03 mg/dL). No results for input(s): VANCOTROUGH, VANCOPEAK, VANCORANDOM, GENTTROUGH, GENTPEAK, GENTRANDOM, TOBRATROUGH, TOBRAPEAK, TOBRARND, AMIKACINPEAK, AMIKACINTROU, AMIKACIN in the last 72 hours.   Microbiology: Recent Results (from the past 720 hour(s))  Surgical pcr screen     Status: None   Collection Time: 01/04/16  8:26 AM  Result Value Ref Range Status   MRSA, PCR NEGATIVE NEGATIVE Final   Staphylococcus aureus NEGATIVE NEGATIVE Final    Comment:        The Xpert SA Assay (FDA approved for NASAL specimens in patients over 59 years of age), is one component of a comprehensive surveillance program.  Test performance has been validated by [facility] for patients greater than or equal to 42 year old. It is not intended to diagnose infection nor to guide  or monitor treatment.     Medical History: Past Medical History:  Diagnosis Date  . History of colon polyps    benign  . History of kidney stones   . History of migraine    last one about a month ago  . Hyperlipidemia    takes Atorvastatin daily  . Joint pain   . 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 Dose Vancomycin (PCN allergy) while patient has a drain.  WBC 6.4, Scr 1.03, CrCl 85  Goal of Therapy:  Vancomycin trough level 10-15 mcg/ml  Plan:  Vancomycin 1g IV q12 hrs. Vanco trough after 3-5 doses at steady state if continued.  Jeremiah Carpenter S. [name], PharmD, BCPS Clinical Staff Pharmacist Pager [phone]  [name] 01/11/2016,4:09 PM

## 2016-01-11 NOTE — H&P (Signed)
Jeremiah Carpenter is an 50 y.o. male.    The patient returns.  He is here with his wife with ongoing problems with neck pain.  He states that prednisone gave him temporary relief.  He has been seeing a chiropractor for over a year.  He has been through tractions, anti-inflammatories, muscle relaxants, prednisone pack.  It bothers him on a daily basis.  He has had increased symptoms on his left arm.  He drops objects and left arm has felt weak.  At times, when he feels a sudden electrical shock that runs down his arm, usually with lifting or turning his neck to the side, he has dropped whatever is in his hand due to the electrical sensation.  He has been through acupuncture, ibuprofen, Lodine, Aleve.    CURRENT MEDICATIONS:  Include Testim 5 mg daily and Lipitor.   ALLERGIES:  NKDA.   PAST MEDICAL/SURGICAL HISTORY:  The patient is followed by Dr. Arnette Norris.  Previous surgeries include right and left carpal tunnel releases 2002/2003.   SOCIAL HISTORY:  He is here with his wife, Jeremiah Carpenter.  Does not smoke.  He works as a Higher education careers adviser type company.  Rare alcohol consumption.    FAMILY HISTORY:  Positive for heart disease, unknown type of cancer, also hypertension.     Past Medical History:  Diagnosis Date  . History of colon polyps    benign  . History of kidney stones   . History of migraine    last one about a month ago  . Hyperlipidemia    takes Atorvastatin daily  . Joint pain   . Weakness    numbness mainly left hand and rarely on right    Past Surgical History:  Procedure Laterality Date  . CARPAL TUNNEL RELEASE Bilateral   . COLONOSCOPY    . VASECTOMY      Family History  Problem Relation Age of Onset  . Cancer Mother     lung  . Colon cancer Neg Hx    Social History:  reports that he has quit smoking. He has never used smokeless tobacco. He reports that he drinks alcohol. He reports that he does not use drugs.  Allergies:  Allergies  Allergen  Reactions  . Amoxicillin Hives and Rash    Has patient had a PCN reaction causing immediate rash, facial/tongue/throat swelling, SOB or lightheadedness with hypotension: {no Has patient had a PCN reaction causing severe rash involving mucus membranes or skin necrosis:no Has patient had a PCN reaction that required hospitalization no Has patient had a PCN reaction occurring within the last 10 years: yes If all of the above answers are "NO", then may proceed with Cephalosporin use.    Medications Prior to Admission  Medication Sig Dispense Refill  . aspirin 81 MG tablet Take 81 mg by mouth daily.    Marland Kitchen atorvastatin (LIPITOR) 10 MG tablet Take 1 tablet (10 mg total) by mouth at bedtime. OFFICE VISIT WITH LABS REQUIRED FOR ADDITIONAL REFILLS 90 tablet 0  . testosterone (ANDROGEL) 50 MG/5GM (1%) GEL Place 10 g onto the skin daily. 180 Package 3  . EPINEPHrine (EPI-PEN) 0.3 mg/0.3 mL SOAJ injection Inject 0.3 mLs (0.3 mg total) into the muscle once. 1 Device 1    No results found for this or any previous visit (from the past 48 hour(s)). No results found.  Review of Systems  Constitutional: Negative.   HENT: Negative.   Respiratory: Negative.   Cardiovascular: Negative.   Gastrointestinal:  Negative.   Genitourinary: Negative.   Musculoskeletal: Positive for neck pain.  Skin: Negative.   Neurological: Positive for tingling and focal weakness.  Psychiatric/Behavioral: Negative.     Blood pressure (!) 141/93, pulse 77, temperature 97.9 F (36.6 C), temperature source Oral, resp. rate 20, weight 191 lb (86.6 kg), SpO2 99 %. Physical Exam  Constitutional: He is oriented to person, place, and time. No distress.  HENT:  Head: Normocephalic and atraumatic.  Eyes: EOM are normal. Pupils are equal, round, and reactive to light.  Respiratory: No respiratory distress.  GI: He exhibits no distension.  Neurological: He is alert and oriented to person, place, and time.  Skin: Skin is warm and  dry.    PHYSICAL EXAMINATION:  The patient is 5 feet 6 inches, 189 pounds.  BP 154/89, pulse 74.  Extraocular movements intact.  Lungs are clear.  No supraclavicular lymphadenopathy.  Has brachial plexus tenderness on the left.  Positive Spurling.  Positive Lhermitte.  Lower extremity reflexes are 2+.  Upper extremity reflexes are 2+.  Grip, biceps, triceps, brachial radialis, wrist flexion/extension, profundi/sublimi testing normal.    RADIOGRAPHS:  MRI scan is reviewed with the patient and compared to previous image in 2011.  This shows 3-level changes with mild changes at C4-5.  He has progressive stenosis at C5-6, now moderate with moderate foraminal stenosis.  C6-7 shows progressive disk osteophyte complex on the left with moderate left foraminal stenosis.    PLAN:  We discussed surgical options and recommendations would be C5-6, C6-7 cervical diskectomy and fusion, allograft and plate.  He understands at some point the C4-5 level might be a problem at some years down the road.  Overnight stay in the hospital, use of soft collar for 6 weeks.  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  We discussed dysphagia, dysphonia, pseudoarthrosis, posterior fusion if the pseudoarthrosis was symptomatic.  All questions answered.  He understands.  Would like to schedule this around his Thanksgiving time off.  Questions were elicited and answered.   Benjiman Core, PA-C 01/11/2016, 11:47 AM

## 2016-01-11 NOTE — Anesthesia Postprocedure Evaluation (Signed)
Anesthesia Post Note  Patient: Jeremiah Carpenter  Procedure(s) Performed: Procedure(s) (LRB): C5-6, C6-7 Anterior Cervical Discectomy and Fusion, Allograft, Plate (N/A)  Patient location during evaluation: PACU Anesthesia Type: General Level of consciousness: awake Vital Signs Assessment: post-procedure vital signs reviewed and stable Respiratory status: spontaneous breathing Cardiovascular status: stable Anesthetic complications: no    Last Vitals:  Vitals:   01/11/16 1548 01/11/16 1600  BP: 131/88 (!) 143/90  Pulse: 77   Resp: 17   Temp: 36.7 C     Last Pain:  Vitals:   01/11/16 1123  TempSrc:   PainSc: 3                  EDWARDS,Zyree Traynham

## 2016-01-11 NOTE — Anesthesia Preprocedure Evaluation (Signed)
Anesthesia Evaluation  Patient identified by MRN, date of birth, ID band Patient awake  General Assessment Comment:History noted. CE  Reviewed: Allergy & Precautions, Patient's Chart, lab work & pertinent test results  Airway Mallampati: II  TM Distance: >3 FB     Dental   Pulmonary neg pulmonary ROS, former smoker,    breath sounds clear to auscultation       Cardiovascular negative cardio ROS   Rhythm:Regular Rate:Normal     Neuro/Psych  Headaches,    GI/Hepatic negative GI ROS, Neg liver ROS,   Endo/Other  negative endocrine ROS  Renal/GU negative Renal ROS     Musculoskeletal   Abdominal   Peds  Hematology   Anesthesia Other Findings   Reproductive/Obstetrics                             Anesthesia Physical Anesthesia Plan  ASA: II  Anesthesia Plan: General   Post-op Pain Management:    Induction: Intravenous  Airway Management Planned: Oral ETT  Additional Equipment:   Intra-op Plan:   Post-operative Plan: Extubation in OR  Informed Consent: I have reviewed the patients History and Physical, chart, labs and discussed the procedure including the risks, benefits and alternatives for the proposed anesthesia with the patient or authorized representative who has indicated his/her understanding and acceptance.   Dental advisory given  Plan Discussed with: Anesthesiologist and CRNA  Anesthesia Plan Comments:         Anesthesia Quick Evaluation

## 2016-01-11 NOTE — Op Note (Signed)
Preoiagnosis: Cervical spondylosis C5-6, C6-7  Postop diagnosis: Same  Procedure: C5-6, C6-7 anterior cervical discectomy and fusion, allograft and plate.  Surgeon: Rodell Perna M.D.  Assistant: Benjiman Core PA-C  Anesthesia Gen. oral endotracheal +6 mL Marcaine local  EBL less than 150 mL  Drains: One Hemovac neck.  Procedure: After standard prepping and draping with sterile Mayo stand at the head there squared with towels sterile skin marker was used Betadine Steri-Drape thyroid sheets and drapes timeout procedure preoperative vancomycin. Incision was made starting at the midline extending to the left. Platysma was divided in line with the skin incision. Blunt dissection down with a prominent spur C5-6 C6-7 was performed. Short 25 needle placed with a straight clamp and the C5-6 level confirmed with a crosstable lateral C-arm image. Self-retaining retractors were placed discectomy was performed progression back with the bur hand L Clark curettes using operative microscope taking in the posterior longitudinal ligaments and decompressing the dura. 6 mm trial size 50 nicelydistractingitthe58mm.Completedecompressionofthedurawasperformed.  24mm cortical cancellous graft was placed countersunk 2 mm. Identical procedure was repeated at C6-7 taking off the anterior spurs. Excising the discs scraping the endplates. Some surgical flow was used at both levels due to some prominent epidural vein bleeding and on the right side a bipolar cautery was used to catch one vein that had 2 small bifurcations. Uncovertebral joints were stripped. Overhanging spurs removed on both sides. 6 mm graft V G2 was placed at each level. Skyline 32 mm plate was selected 14 mm screws 6 C-arm was used to confirm position for screws were inserted screws were locked and Hemovacs placement and out technique in line with skin incision platysma reapproximated with 3-0 Vicryl for Vicryl subarticular closure Mark infiltration the skin  copious irrigation prior to closure upper field was dry timeout closure of the fingertip was placed down along the anterior aspect of the cervical spine for any aggressive fluid toward the mediastinum. Patient tolerated the procedure well transferred recovery room stable condition.

## 2016-01-11 NOTE — Interval H&P Note (Signed)
History and Physical Interval Note:  01/11/2016 1:04 PM  [name]  has presented today for surgery, with the diagnosis of Cervical Spondylosis, Stenosis C5-6, C6-7  The various methods of treatment have been discussed with the patient and family. After consideration of risks, benefits and other options for treatment, the patient has consented to  Procedure(s): C5-6, C6-7 Anterior Cervical Discectomy and Fusion, Allograft, Plate (N/A) as a surgical intervention .  The patient's history has been reviewed, patient examined, no change in status, stable for surgery.  I have reviewed the patient's chart and labs.  Questions were answered to the patient's satisfaction.     Jeremiah Carpenter

## 2016-01-11 NOTE — Progress Notes (Signed)
Orthopedic Tech Progress Note Patient Details:  [name] 22-Jul-1965 [id number]  Patient ID: Jeremiah Carpenter, male   DOB: 06-Feb-1966, 50 y.o.   MRN: [id number]  Patient has Soft Collar at this time.  Kristopher Oppenheim 01/11/2016, 7:50 PM

## 2016-01-11 NOTE — Transfer of Care (Signed)
Immediate Anesthesia Transfer of Care Note  Patient: Jeremiah Carpenter  Procedure(s) Performed: Procedure(s): C5-6, C6-7 Anterior Cervical Discectomy and Fusion, Allograft, Plate (N/A)  Patient Location: PACU  Anesthesia Type:General  Level of Consciousness: awake, alert , patient cooperative and responds to stimulation  Airway & Oxygen Therapy: Patient Spontanous Breathing and Patient connected to nasal cannula oxygen  Post-op Assessment: Report given to RN and Post -op Vital signs reviewed and stable  Post vital signs: Reviewed and stable  Last Vitals:  Vitals:   01/11/16 1044  BP: (!) 141/93  Pulse: 77  Resp: 20  Temp: 36.6 C    Last Pain:  Vitals:   01/11/16 1123  TempSrc:   PainSc: 3       Patients Stated Pain Goal: 4 ([id number])  Complications: No apparent anesthesia complications

## 2016-01-11 NOTE — Progress Notes (Signed)
Pt arrived to floor alert and orientated

## 2016-01-12 ENCOUNTER — Encounter (HOSPITAL_COMMUNITY): Payer: Self-pay | Admitting: Orthopaedic Surgery

## 2016-01-12 DIAGNOSIS — M47812 Spondylosis without myelopathy or radiculopathy, cervical region: Secondary | ICD-10-CM | POA: Diagnosis not present

## 2016-01-12 MED ORDER — OXYCODONE-ACETAMINOPHEN 5-325 MG PO TABS: 1.0000 | ORAL_TABLET | Freq: Four times a day (QID) | ORAL | 0 refills | Status: DC | PRN

## 2016-01-12 MED ORDER — METHOCARBAMOL 500 MG PO TABS: 500.0000 mg | ORAL_TABLET | Freq: Four times a day (QID) | ORAL | 1 refills | Status: DC | PRN

## 2016-01-12 NOTE — Progress Notes (Signed)
   Subjective: 1 Day Post-Op Procedure(s) (LRB): C5-6, C6-7 Anterior Cervical Discectomy and Fusion, Allograft, Plate (N/A) Patient reports pain as mild.    Objective: Vital signs in last 24 hours: Temp:  [97.5 F (36.4 C)-98 F (36.7 C)] 97.9 F (36.6 C) (11/16 0520) Pulse Rate:  [72-95] 88 (11/16 0520) Resp:  [10-20] 16 (11/16 0520) BP: (116-143)/(78-93) 128/79 (11/16 0520) SpO2:  [96 %-100 %] 99 % (11/16 0520) FiO2 (%):  [1 %] 1 % (11/15 2000) Weight:  [191 lb (86.6 kg)] 191 lb (86.6 kg) (11/15 1044)  Intake/Output from previous day: 11/15 0701 - 11/16 0700 In: 2140 [P.O.:240; I.V.:1900] Out: 850 [Urine:750; Drains:25; Blood:75] Intake/Output this shift: No intake/output data recorded.  No results for input(s): HGB in the last 72 hours. No results for input(s): WBC, RBC, HCT, PLT in the last 72 hours. No results for input(s): NA, K, CL, CO2, BUN, CREATININE, GLUCOSE, CALCIUM in the last 72 hours. No results for input(s): LABPT, INR in the last 72 hours.  Neurologically intact, good relief of pre-op hand pain  Assessment/Plan: 1 Day Post-Op Procedure(s) (LRB): C5-6, C6-7 Anterior Cervical Discectomy and Fusion, Allograft, Plate (N/A) Plan : discharge home.   Jeremiah Carpenter 01/12/2016, 7:59 AM

## 2016-01-12 NOTE — Progress Notes (Signed)
Reviewed discharge papers and medications with full understanding, IV d/c, dressing changed dry and intact

## 2016-01-15 ENCOUNTER — Other Ambulatory Visit: Payer: Self-pay | Admitting: Family Medicine

## 2016-01-17 ENCOUNTER — Encounter (INDEPENDENT_AMBULATORY_CARE_PROVIDER_SITE_OTHER): Payer: Self-pay | Admitting: Orthopaedic Surgery

## 2016-01-17 ENCOUNTER — Ambulatory Visit (INDEPENDENT_AMBULATORY_CARE_PROVIDER_SITE_OTHER): Payer: Commercial Managed Care - PPO

## 2016-01-17 ENCOUNTER — Ambulatory Visit (INDEPENDENT_AMBULATORY_CARE_PROVIDER_SITE_OTHER): Payer: Commercial Managed Care - PPO | Admitting: Orthopaedic Surgery

## 2016-01-17 DIAGNOSIS — M4802 Spinal stenosis, cervical region: Secondary | ICD-10-CM | POA: Diagnosis not present

## 2016-01-17 NOTE — Discharge Summary (Signed)
Patient ID: Jeremiah Carpenter MRN: [id number] DOB/AGE: 1965-11-29 50 y.o.  Admit date: 01/11/2016 Discharge date: 01/17/2016  Admission Diagnoses:  Active Problems:   Cervical spinal stenosis   Discharge Diagnoses:  Active Problems:   Cervical spinal stenosis  status post Procedure(s): C5-6, C6-7 Anterior Cervical Discectomy and Fusion, Allograft, Plate  Past Medical History:  Diagnosis Date  . History of colon polyps    benign  . History of kidney stones   . History of migraine    last one about a month ago  . Hyperlipidemia    takes Atorvastatin daily  . Joint pain   . Weakness    numbness mainly left hand and rarely on right    Surgeries: Procedure(s): C5-6, C6-7 Anterior Cervical Discectomy and Fusion, Allograft, Plate on QA348G   Consultants:   Discharged Condition: Improved  Hospital Course: Jeremiah Carpenter is an 50 y.o. male who was admitted 01/11/2016 for operative treatment of cervical stenosis. Patient failed conservative treatments (please see the history and physical for the specifics) and had severe unremitting pain that affects sleep, daily activities and work/hobbies. After pre-op clearance, the patient was taken to the operating room on 01/11/2016 and underwent  Procedure(s): C5-6, C6-7 Anterior Cervical Discectomy and Fusion, Allograft, Plate.    Patient was given perioperative antibiotics:  Anti-infectives    Start     Dose/Rate Route Frequency Ordered Stop   01/12/16 0100  vancomycin (VANCOCIN) IVPB 1000 mg/200 mL premix  Status:  Discontinued     1,000 mg 200 mL/hr over 60 Minutes Intravenous Every 12 hours 01/11/16 1616 01/12/16 1315   01/11/16 0600  vancomycin (VANCOCIN) IVPB 1000 mg/200 mL premix     1,000 mg 200 mL/hr over 60 Minutes Intravenous To Short Stay 01/10/16 0927 01/11/16 1442       Patient was given sequential compression devices and early ambulation to prevent DVT.   Patient benefited maximally from hospital stay  and there were no complications. At the time of discharge, the patient was urinating/moving their bowels without difficulty, tolerating a regular diet, pain is controlled with oral pain medications and they have been cleared by PT/OT.   Recent vital signs: No data found.    Recent laboratory studies: No results for input(s): WBC, HGB, HCT, PLT, NA, K, CL, CO2, BUN, CREATININE, GLUCOSE, INR, CALCIUM in the last 72 hours.  Invalid input(s): PT, 2   Discharge Medications:     Medication List    TAKE these medications   aspirin 81 MG tablet Take 81 mg by mouth daily.   EPINEPHrine 0.3 mg/0.3 mL Soaj injection Commonly known as:  EPI-PEN Inject 0.3 mLs (0.3 mg total) into the muscle once.   methocarbamol 500 MG tablet Commonly known as:  ROBAXIN Take 1 tablet (500 mg total) by mouth every 6 (six) hours as needed for muscle spasms.   oxyCODONE-acetaminophen 5-325 MG tablet Commonly known as:  PERCOCET/ROXICET Take 1-2 tablets by mouth every 6 (six) hours as needed for moderate pain.   testosterone 50 MG/5GM (1%) Gel Commonly known as:  ANDROGEL Place 10 g onto the skin daily.       Diagnostic Studies: Dg Chest 2 View  Result Date: 01/04/2016 CLINICAL DATA:  Preoperative radiograph post cervical fusion. EXAM: CHEST  2 VIEW COMPARISON:  09/03/2011 FINDINGS: The heart size and mediastinal contours are within normal limits. Both lungs are clear. The visualized skeletal structures are unremarkable. IMPRESSION: No active cardiopulmonary disease. Electronically Signed   By: Fidela Salisbury  M.D.   On: 01/04/2016 10:10   Dg Cervical Spine 2-3 Views  Result Date: 01/11/2016 CLINICAL DATA:  C5 through C7 ACDF. EXAM: CERVICAL SPINE - 2-3 VIEW; DG C-ARM 61-120 MIN COMPARISON:  None. FINDINGS: Three fluoroscopic spot images of the cervical spine in frontal and lateral projections demonstrate anterior plate and screw fusion C5 through C7. Alignment is maintained. Total fluoroscopy time 21  seconds. IMPRESSION: Intraoperative fluoroscopy post C5 through C7 ACDF. Electronically Signed   By: Jeb Levering M.D.   On: 01/11/2016 15:25   Dg C-arm 1-60 Min  Result Date: 01/11/2016 CLINICAL DATA:  C5 through C7 ACDF. EXAM: CERVICAL SPINE - 2-3 VIEW; DG C-ARM 61-120 MIN COMPARISON:  None. FINDINGS: Three fluoroscopic spot images of the cervical spine in frontal and lateral projections demonstrate anterior plate and screw fusion C5 through C7. Alignment is maintained. Total fluoroscopy time 21 seconds. IMPRESSION: Intraoperative fluoroscopy post C5 through C7 ACDF. Electronically Signed   By: Jeb Levering M.D.   On: 01/11/2016 15:25   Xr Cervical Spine 2 Or 3 Views  Result Date: 01/17/2016 2 views cervical spine obtained. This shows two-level cervical fusion good position of grafts plate and screws. Some calcification posterior ligament from old injury noted. Impression: Postop two-level cervical fusion with satisfactory postop x-rays       Discharge Plan:  discharge to home  Disposition:     Signed: Benjiman Core for Rodell Perna MD 01/17/2016, 5:01 PM

## 2016-01-17 NOTE — Progress Notes (Signed)
   Post-Op Visit Note   Patient: Jeremiah Carpenter           Date of Birth: 02/16/1966           MRN: [id number] Visit Date: 01/17/2016 PCP: Arnette Norris, MD   Assessment & Plan:  Chief Complaint:  Chief Complaint  Patient presents with  . Neck - Routine Post Op   Visit Diagnoses:  1. Cervical stenosis of spine   2. Cervical spinal stenosis   C5-6 C6-7 anterior cervical discectomy and fusion allograft and plate 01/11/2016 Good relief of preop the pain symptoms. Incision looks good Steri-Strips removed. Plan: Return visit in 5 weeks lateral flexion-extension C-spine x-ray on return  Follow-Up Instructions: Return in about 5 weeks (around 02/21/2016).   Orders:  Orders Placed This Encounter  Procedures  . XR Cervical Spine 2 or 3 views   No orders of the defined types were placed in this encounter.  Patient returns for first post-op visit. He had C5-6, C6-7 ACDF, Allograft, and Plate on QA348G.  He is six days out. He states that he is having some mild pain, more discomfort in the back of the neck. Pre-op symptoms are subsiding. He only takes Oxycodone and the muscle relaxer as needed. He has taken Tylenol for a headache.    PMFS History: Patient Active Problem List   Diagnosis Date Noted  . Cervical spinal stenosis 01/11/2016  . Elevated blood pressure 09/19/2015  . Headache 07/27/2015  . Microscopic hematuria 08/31/2014  . Hx of adenomatous polyp of colon 05/19/2014  . Low testosterone 05/06/2012  . Fatigue 09/03/2011  . HYPERCHOLESTEROLEMIA 06/21/2009  . PES PLANUS 05/18/2009   Past Medical History:  Diagnosis Date  . History of colon polyps    benign  . History of kidney stones   . History of migraine    last one about a month ago  . Hyperlipidemia    takes Atorvastatin daily  . Joint pain   . Weakness    numbness mainly left hand and rarely on right    Family History  Problem Relation Age of Onset  . Cancer Mother     lung  . Colon cancer Neg Hx       Past Surgical History:  Procedure Laterality Date  . ANTERIOR CERVICAL DECOMP/DISCECTOMY FUSION N/A 01/11/2016   Procedure: C5-6, C6-7 Anterior Cervical Discectomy and Fusion, Allograft, Plate;  Surgeon: Marybelle Killings, MD;  Location: [facility];  Service: Orthopedics;  Laterality: N/A;  . CARPAL TUNNEL RELEASE Bilateral   . COLONOSCOPY    . VASECTOMY     Social History   Occupational History  . Not on file.   Social History Main Topics  . Smoking status: Former Research scientist (life sciences)  . Smokeless tobacco: Never Used     Comment: quit smoking 15+ yrs ago  . Alcohol use 0.0 oz/week     Comment: occasionally  . Drug use: No  . Sexual activity: Yes

## 2016-01-18 ENCOUNTER — Telehealth (INDEPENDENT_AMBULATORY_CARE_PROVIDER_SITE_OTHER): Payer: Self-pay | Admitting: *Deleted

## 2016-01-18 NOTE — Telephone Encounter (Signed)
I called patient and advised-cough medicine is ok, note with driving restrictions faxed to his work number supplied.

## 2016-01-18 NOTE — Telephone Encounter (Signed)
Please advise 

## 2016-01-18 NOTE — Telephone Encounter (Signed)
Pt. Called stating he needs a note for work stating that he has a driving restriction and if there was any restrictions. CB: number [phone] his e-mail is Jeremiah Carpenter fax: [phone].   Pt also stated he has a cold and wasn't sure if cough medication was ok to take with ibuprofen.

## 2016-01-18 NOTE — Telephone Encounter (Signed)
Ok send a note that says no driving times 5 wks thanks

## 2016-02-15 ENCOUNTER — Ambulatory Visit (INDEPENDENT_AMBULATORY_CARE_PROVIDER_SITE_OTHER): Payer: Commercial Managed Care - PPO | Admitting: Orthopaedic Surgery

## 2016-02-15 ENCOUNTER — Ambulatory Visit (INDEPENDENT_AMBULATORY_CARE_PROVIDER_SITE_OTHER): Payer: Commercial Managed Care - PPO

## 2016-02-15 ENCOUNTER — Encounter (INDEPENDENT_AMBULATORY_CARE_PROVIDER_SITE_OTHER): Payer: Self-pay | Admitting: Orthopaedic Surgery

## 2016-02-15 VITALS — BP 142/92 | HR 92

## 2016-02-15 DIAGNOSIS — M4802 Spinal stenosis, cervical region: Secondary | ICD-10-CM | POA: Diagnosis not present

## 2016-02-15 DIAGNOSIS — M25522 Pain in left elbow: Secondary | ICD-10-CM | POA: Diagnosis not present

## 2016-02-15 NOTE — Progress Notes (Signed)
Post-Op Visit Note   Patient: Jeremiah Carpenter           Date of Birth: [birth year]           MRN: [id number] Visit Date: 02/15/2016 PCP: Arnette Norris, MD   Assessment & Plan:  Chief Complaint:  Chief Complaint  Patient presents with  . Neck - Routine Post Op   Visit Diagnoses:  1. Spinal stenosis of cervical region   2. Left elbow pain   Posttraumatic lateral epicondylitis   Plan: Patient can discontinue cervical collar. Gradually wean into regular activities. No heavy lifting.  Okay to begin driving. For his left elbow I did give him some stretching exercises to do. Can use over-the-counter tennis elbow strap if needed. Gave him some samples of Pennsaid to use twice daily over the lateral epicondyle. Follow-up in 6 weeks for recheck. If elbow becomes more symptomatic before that time he will return we did discuss possibly doing an injection.  Follow-Up Instructions: Return in about 6 weeks (around 03/28/2016).   Orders:  Orders Placed This Encounter  Procedures  . XR Cervical Spine 2 or 3 views  . XR Elbow Complete Left (3+View)   No orders of the defined types were placed in this encounter.  HPI Patient returns for one month recheck cervical spine. He is status post C5-6, C6-7 ACD&F, Allograft, and Plate on QA348G. He is 5 weeks out today. Patient states that he is not having any pain. He is completely off his post-op medications. He states that he is having no problems.  He continues to wear collar. Patient also complaining of left lateral elbow pain today. States that the week after surgery he banged his left lateral elbow on a hard object and has been painful ever since. Aggravated with gripping, twisting, lifting objects with his left hand. No swelling or bruising. He is very pleased with his surgical result.   Exam: Extremity pleasant white male alert and oriented 3 and in no acute distress. His neck incision has healed very nicely.  Bilateral shoulder exam  unremarkable. No focal motor deficits. Left elbow he has full range of motion. No swelling or bruising noted. He is exquisitely tender over the lateral epicondyle. His pain is aggravated with resisted wrist/finger extension, forearm supination.     PMFS History: Patient Active Problem List   Diagnosis Date Noted  . Cervical spinal stenosis 01/11/2016  . Elevated blood pressure 09/19/2015  . Headache 07/27/2015  . Microscopic hematuria 08/31/2014  . Hx of adenomatous polyp of colon 05/19/2014  . Low testosterone 05/06/2012  . Fatigue 09/03/2011  . HYPERCHOLESTEROLEMIA 06/21/2009  . PES PLANUS 05/18/2009   Past Medical History:  Diagnosis Date  . History of colon polyps    benign  . History of kidney stones   . History of migraine    last one about a month ago  . Hyperlipidemia    takes Atorvastatin daily  . Joint pain   . Weakness    numbness mainly left hand and rarely on right    Family History  Problem Relation Age of Onset  . Cancer Mother     lung  . Colon cancer Neg Hx     Past Surgical History:  Procedure Laterality Date  . ANTERIOR CERVICAL DECOMP/DISCECTOMY FUSION N/A 01/11/2016   Procedure: C5-6, C6-7 Anterior Cervical Discectomy and Fusion, Allograft, Plate;  Surgeon: Marybelle Killings, MD;  Location: [facility];  Service: Orthopedics;  Laterality: N/A;  . CARPAL TUNNEL RELEASE Bilateral   .  COLONOSCOPY    . VASECTOMY     Social History   Occupational History  . Not on file.   Social History Main Topics  . Smoking status: Former Research scientist (life sciences)  . Smokeless tobacco: Never Used     Comment: quit smoking 15+ yrs ago  . Alcohol use 0.0 oz/week     Comment: occasionally  . Drug use: No  . Sexual activity: Yes

## 2016-03-01 ENCOUNTER — Other Ambulatory Visit: Payer: Self-pay | Admitting: Family Medicine

## 2016-03-01 DIAGNOSIS — Z0000 Encounter for general adult medical examination without abnormal findings: Secondary | ICD-10-CM

## 2016-03-02 ENCOUNTER — Other Ambulatory Visit: Payer: Commercial Managed Care - PPO

## 2016-03-05 ENCOUNTER — Other Ambulatory Visit (INDEPENDENT_AMBULATORY_CARE_PROVIDER_SITE_OTHER): Payer: Commercial Managed Care - PPO

## 2016-03-05 DIAGNOSIS — Z0000 Encounter for general adult medical examination without abnormal findings: Secondary | ICD-10-CM | POA: Diagnosis not present

## 2016-03-05 DIAGNOSIS — R7989 Other specified abnormal findings of blood chemistry: Secondary | ICD-10-CM | POA: Diagnosis not present

## 2016-03-05 LAB — CBC WITH DIFFERENTIAL/PLATELET
Basophils Absolute: 0 10*3/uL (ref 0.0–0.1)
Basophils Relative: 0.4 % (ref 0.0–3.0)
EOS PCT: 1.8 % (ref 0.0–5.0)
Eosinophils Absolute: 0.1 10*3/uL (ref 0.0–0.7)
HEMATOCRIT: 43.1 % (ref 39.0–52.0)
HEMOGLOBIN: 14.8 g/dL (ref 13.0–17.0)
LYMPHS PCT: 29.1 % (ref 12.0–46.0)
Lymphs Abs: 1.9 10*3/uL (ref 0.7–4.0)
MCHC: 34.3 g/dL (ref 30.0–36.0)
MCV: 88.4 fl (ref 78.0–100.0)
MONOS PCT: 9.8 % (ref 3.0–12.0)
Monocytes Absolute: 0.6 10*3/uL (ref 0.1–1.0)
Neutro Abs: 3.8 10*3/uL (ref 1.4–7.7)
Neutrophils Relative %: 58.9 % (ref 43.0–77.0)
Platelets: 307 10*3/uL (ref 150.0–400.0)
RBC: 4.88 Mil/uL (ref 4.22–5.81)
RDW: 13.5 % (ref 11.5–15.5)
WBC: 6.4 10*3/uL (ref 4.0–10.5)

## 2016-03-05 LAB — COMPREHENSIVE METABOLIC PANEL
ALBUMIN: 4.4 g/dL (ref 3.5–5.2)
ALK PHOS: 56 U/L (ref 39–117)
ALT: 26 U/L (ref 0–53)
AST: 21 U/L (ref 0–37)
BUN: 22 mg/dL (ref 6–23)
CALCIUM: 9.6 mg/dL (ref 8.4–10.5)
CHLORIDE: 106 meq/L (ref 96–112)
CO2: 28 mEq/L (ref 19–32)
CREATININE: 1.07 mg/dL (ref 0.40–1.50)
GFR: 77.04 mL/min (ref >60.00)
Glucose, Bld: 97 mg/dL (ref 70–99)
POTASSIUM: 4.2 meq/L (ref 3.5–5.1)
Sodium: 141 mEq/L (ref 135–145)
TOTAL PROTEIN: 7 g/dL (ref 6.0–8.3)
Total Bilirubin: 0.5 mg/dL (ref 0.2–1.2)

## 2016-03-05 LAB — LIPID PANEL
CHOL/HDL RATIO: 5
CHOLESTEROL: 199 mg/dL (ref 0–200)
HDL: 37.9 mg/dL — ABNORMAL LOW (ref >39.00)
NonHDL: 161.59
TRIGLYCERIDES: 255 mg/dL — AB (ref 0.0–149.0)
VLDL: 51 mg/dL — ABNORMAL HIGH (ref 0.0–40.0)

## 2016-03-05 LAB — LDL CHOLESTEROL, DIRECT: LDL DIRECT: 104 mg/dL

## 2016-03-05 LAB — PSA: PSA: 1.51 ng/mL (ref 0.10–4.00)

## 2016-03-08 ENCOUNTER — Ambulatory Visit (INDEPENDENT_AMBULATORY_CARE_PROVIDER_SITE_OTHER): Payer: Commercial Managed Care - PPO | Admitting: Family Medicine

## 2016-03-08 VITALS — BP 116/80 | HR 89 | Temp 98.2°F | Ht 64.5 in | Wt 193.2 lb

## 2016-03-08 DIAGNOSIS — M4802 Spinal stenosis, cervical region: Secondary | ICD-10-CM | POA: Diagnosis not present

## 2016-03-08 DIAGNOSIS — Z86010 Personal history of colonic polyps: Secondary | ICD-10-CM

## 2016-03-08 DIAGNOSIS — E7800 Pure hypercholesterolemia, unspecified: Secondary | ICD-10-CM

## 2016-03-08 DIAGNOSIS — Z0000 Encounter for general adult medical examination without abnormal findings: Secondary | ICD-10-CM

## 2016-03-08 DIAGNOSIS — Z01419 Encounter for gynecological examination (general) (routine) without abnormal findings: Secondary | ICD-10-CM | POA: Insufficient documentation

## 2016-03-08 MED ORDER — ATORVASTATIN CALCIUM 10 MG PO TABS: ORAL_TABLET | ORAL | 2 refills | Status: DC

## 2016-03-08 NOTE — Assessment & Plan Note (Signed)
Colonoscopy UTD. 

## 2016-03-08 NOTE — Assessment & Plan Note (Signed)
Reviewed preventive care protocols, scheduled due services, and updated immunizations Discussed nutrition, exercise, diet, and healthy lifestyle.  

## 2016-03-08 NOTE — Patient Instructions (Addendum)
Great to see you.  Triglycerides are too high.  Weight loss (even small amount) can decrease triglycerides.  Decrease added sugars, eliminate trans fats, increase fiber and limit alcohol.  All these changes together can drop triglycerides by almost 50%.  Have a great trip!

## 2016-03-08 NOTE — Assessment & Plan Note (Signed)
Doing well s/p surgery.

## 2016-03-08 NOTE — Progress Notes (Signed)
Subjective:   Patient ID: Jeremiah Carpenter, male    DOB: 05/06/1965, 51 y.o.   MRN: [id number]  Jeremiah Carpenter is a pleasant 51 y.o. year old male who presents to clinic today with Annual Exam  on 03/08/2016  HPI:  Colonoscopy 05/14/14- polyps, 5 year recall Doing well.  Has gained some weight recently but starting to walk.  HLD- has been well controlled with low dose lipitor.  Lab Results  Component Value Date   CHOL 199 03/05/2016   HDL 37.90 (L) 03/05/2016   LDLCALC 114 (H) 03/23/2013   LDLDIRECT 104.0 03/05/2016   TRIG 255.0 (H) 03/05/2016   CHOLHDL 5 03/05/2016   Lab Results  Component Value Date   ALT 26 03/05/2016   AST 21 03/05/2016   ALKPHOS 56 03/05/2016   BILITOT 0.5 03/05/2016   Cervical stenosis- followed by Dr. Lorin Mercy. Was last seen on 02/15/16.  Note reviewed. Doing well s/p C5-6 and C6-7 anterior discectomy. Released to drive on [id number].  Low T- on adrogel.  Followed by urology. Lab Results  Component Value Date   PSA 1.51 03/05/2016   PSA 0.89 01/14/2015   PSA 0.93 03/22/2014   Lab Results  Component Value Date   TESTOSTERONE 527 03/21/2015    Current Outpatient Prescriptions on File Prior to Visit  Medication Sig Dispense Refill  . aspirin 81 MG tablet Take 81 mg by mouth daily.    Marland Kitchen testosterone (ANDROGEL) 50 MG/5GM (1%) GEL Place 10 g onto the skin daily. 180 Package 3   No current facility-administered medications on file prior to visit.     Allergies  Allergen Reactions  . Amoxicillin Hives and Rash    Has patient had a PCN reaction causing immediate rash, facial/tongue/throat swelling, SOB or lightheadedness with hypotension: no Has patient had a PCN reaction causing severe rash involving mucus membranes or skin necrosis:no Has patient had a PCN reaction that required hospitalization no Has patient had a PCN reaction occurring within the last 10 years: yes If all of the above answers are "NO", then may proceed with  Cephalosporin use.    Past Medical History:  Diagnosis Date  . History of colon polyps    benign  . History of kidney stones   . History of migraine    last one about a month ago  . Hyperlipidemia    takes Atorvastatin daily  . Joint pain   . Weakness    numbness mainly left hand and rarely on right    Past Surgical History:  Procedure Laterality Date  . ANTERIOR CERVICAL DECOMP/DISCECTOMY FUSION N/A 01/11/2016   Procedure: C5-6, C6-7 Anterior Cervical Discectomy and Fusion, Allograft, Plate;  Surgeon: Marybelle Killings, MD;  Location: [facility];  Service: Orthopedics;  Laterality: N/A;  . CARPAL TUNNEL RELEASE Bilateral   . COLONOSCOPY    . VASECTOMY      Family History  Problem Relation Age of Onset  . Cancer Mother     lung  . Colon cancer Neg Hx     Social History   Social History  . Marital status: Married    Spouse name: N/A  . Number of children: N/A  . Years of education: N/A   Occupational History  . Not on file.   Social History Main Topics  . Smoking status: Former Research scientist (life sciences)  . Smokeless tobacco: Never Used     Comment: quit smoking 15+ yrs ago  . Alcohol use 0.0 oz/week     Comment: occasionally  .  Drug use: No  . Sexual activity: Yes   Other Topics Concern  . Not on file   Social History Narrative  . No narrative on file   The PMH, PSH, Social History, Family History, Medications, and allergies have been reviewed in [facility], and have been updated if relevant.  Review of Systems  Constitutional: Negative.   HENT: Negative.   Eyes: Negative.   Respiratory: Negative.   Cardiovascular: Negative.   Gastrointestinal: Negative.   Endocrine: Negative.   Genitourinary: Negative.   Musculoskeletal: Negative.   Allergic/Immunologic: Negative.   Neurological: Negative.   Hematological: Negative.   Psychiatric/Behavioral: Negative.   All other systems reviewed and are negative.      Objective:    BP 116/80   Pulse 89   Temp 98.2 F (36.8 C) (Oral)    Ht 5' 4.5" (1.638 m)   Wt 193 lb 4 oz (87.7 kg)   SpO2 97%   BMI 32.66 kg/m   Wt Readings from Last 3 Encounters:  03/08/16 193 lb 4 oz (87.7 kg)  01/11/16 191 lb (86.6 kg)  01/04/16 191 lb 8 oz (86.9 kg)    Physical Exam  General:  pleasant male in no acute distress Eyes:  PERRL Ears:  External ear exam shows no significant lesions or deformities.  TMs normal bilaterally Hearing is grossly normal bilaterally. Nose:  External nasal examination shows no deformity or inflammation. Nasal mucosa are pink and moist without lesions or exudates. Mouth:  Oral mucosa and oropharynx without lesions or exudates.  Teeth in good repair. Neck:  no carotid bruit or thyromegaly no cervical or supraclavicular lymphadenopathy  Lungs:  Normal respiratory effort, chest expands symmetrically. Lungs are clear to auscultation, no crackles or wheezes. Heart:  Normal rate and regular rhythm. S1 and S2 normal without gallop, murmur, click, rub or other extra sounds. Abdomen:  Bowel sounds positive,abdomen soft and non-tender without masses, organomegaly or hernias noted. Extremities:  no edema  Psych:  Good eye contact, not anxious or depressed appearing       Assessment & Plan:   Visit for well man health check  Well woman exam  Hx of adenomatous polyp of colon  Cervical spinal stenosis  HYPERCHOLESTEROLEMIA No Follow-up on file.

## 2016-03-08 NOTE — Assessment & Plan Note (Signed)
Well controlled on current dose of lipitor. No changes made to rxs today.

## 2016-03-28 ENCOUNTER — Ambulatory Visit (INDEPENDENT_AMBULATORY_CARE_PROVIDER_SITE_OTHER): Payer: Commercial Managed Care - PPO | Admitting: Orthopaedic Surgery

## 2016-03-28 ENCOUNTER — Encounter (INDEPENDENT_AMBULATORY_CARE_PROVIDER_SITE_OTHER): Payer: Self-pay | Admitting: Orthopaedic Surgery

## 2016-03-28 VITALS — BP 127/88 | HR 78 | Ht 65.0 in | Wt 190.0 lb

## 2016-03-28 DIAGNOSIS — M7712 Lateral epicondylitis, left elbow: Secondary | ICD-10-CM

## 2016-03-28 DIAGNOSIS — Z981 Arthrodesis status: Secondary | ICD-10-CM

## 2016-03-28 NOTE — Progress Notes (Signed)
Office Visit Note   Patient: Jeremiah Carpenter           Date of Birth: Mar 22, 1965           MRN: [id number] Visit Date: 03/28/2016              Requested by: Lucille Passy, MD [place], [place] [id number] PCP: Arnette Norris, MD   Assessment & Plan: Visit Diagnoses:  1. S/P cervical spinal fusion   2. Lateral epicondylitis, left elbow     Plan: Patient got good relief from his prep Symptoms. He had some mild dysphasia which is resolving. Lateral epicondyle still tender but it's not as severe as was last month. He is going to [place] and doing some kayaking on vacation. He has increased symptoms he can return for lateral epicondylar injection pathophysiology discussed. We discussed activity modification with his lateral epicondylitis on the left elbow.  Follow-Up Instructions: Return if symptoms worsen or fail to improve.   Orders:  No orders of the defined types were placed in this encounter.  No orders of the defined types were placed in this encounter.     Procedures: No procedures performed   Clinical Data: No additional findings.   Subjective: Chief Complaint  Patient presents with  . Neck - Routine Post Op  . Left Elbow - Follow-up    Patient returns for 6 week check. He is status post C5-6, C6-7 ACD&F, Allograft, and Plate on QA348G. He is also being seen for tennis elbow on the left. He states that his neck is doing well. He is not having any problems. He is still having pain in his left elbow. He tried the Pennsaid with no relief.     Review of Systems review of systems unchanged from surgery left lateral epicondylitis at his elbow has improved.   Objective: Vital Signs: BP 127/88   Pulse 78   Ht 5\' 5"  (1.651 m)   Wt 190 lb (86.2 kg)   BMI 31.62 kg/m   Physical Exam negative Spurling. Lateral epicondylar tenderness persists. No palpable defect. Elbow has full range of motion. Ulnar nerve median nerve in the forearm is normal.  Ortho  Exam  Specialty Comments:  No specialty comments available.  Imaging: No results found.   PMFS History: Patient Active Problem List   Diagnosis Date Noted  . Visit for well man health check 03/08/2016  . Elevated blood pressure 09/19/2015  . Hx of adenomatous polyp of colon 05/19/2014  . Low testosterone 05/06/2012  . Fatigue 09/03/2011  . HYPERCHOLESTEROLEMIA 06/21/2009  . PES PLANUS 05/18/2009   Past Medical History:  Diagnosis Date  . History of colon polyps    benign  . History of kidney stones   . History of migraine    last one about a month ago  . Hyperlipidemia    takes Atorvastatin daily  . Joint pain   . Weakness    numbness mainly left hand and rarely on right    Family History  Problem Relation Age of Onset  . Cancer Mother     lung  . Colon cancer Neg Hx     Past Surgical History:  Procedure Laterality Date  . ANTERIOR CERVICAL DECOMP/DISCECTOMY FUSION N/A 01/11/2016   Procedure: C5-6, C6-7 Anterior Cervical Discectomy and Fusion, Allograft, Plate;  Surgeon: Marybelle Killings, MD;  Location: [facility];  Service: Orthopedics;  Laterality: N/A;  . CARPAL TUNNEL RELEASE Bilateral   . COLONOSCOPY    .  VASECTOMY     Social History   Occupational History  . Not on file.   Social History Main Topics  . Smoking status: Former Research scientist (life sciences)  . Smokeless tobacco: Never Used     Comment: quit smoking 15+ yrs ago  . Alcohol use 0.0 oz/week     Comment: occasionally  . Drug use: No  . Sexual activity: Yes

## 2016-04-06 ENCOUNTER — Telehealth: Payer: Self-pay

## 2016-04-06 MED ORDER — OSELTAMIVIR PHOSPHATE 75 MG PO CAPS
75.0000 mg | ORAL_CAPSULE | Freq: Two times a day (BID) | ORAL | 0 refills | Status: DC
Start: 2016-04-06 — End: 2017-01-16

## 2016-04-06 NOTE — Telephone Encounter (Signed)
eRx sent.  I Hope he feels better and has a safe trip!

## 2016-04-06 NOTE — Telephone Encounter (Signed)
Pt left v/m; pts daughter was dx with Part A flu; pt is being to feel sick with flu like symptoms, h/a and low grade fever; slighit head congestion. and pt request tamiflu to [facility]. Pt going to [place] at end of week.

## 2016-06-13 ENCOUNTER — Encounter: Payer: Self-pay | Admitting: Family Medicine

## 2016-06-14 ENCOUNTER — Other Ambulatory Visit: Payer: Self-pay | Admitting: Family Medicine

## 2016-06-14 DIAGNOSIS — R7989 Other specified abnormal findings of blood chemistry: Secondary | ICD-10-CM

## 2016-06-14 MED ORDER — TESTOSTERONE 50 MG/5GM (1%) TD GEL: 10.0000 g | Freq: Every day | TRANSDERMAL | 3 refills | Status: DC

## 2016-06-14 NOTE — Telephone Encounter (Signed)
He had a CPE 03-08-16 But last testosterone lab was 03-21-15

## 2016-06-19 ENCOUNTER — Other Ambulatory Visit: Payer: Commercial Managed Care - PPO

## 2016-06-27 ENCOUNTER — Other Ambulatory Visit (INDEPENDENT_AMBULATORY_CARE_PROVIDER_SITE_OTHER): Payer: Commercial Managed Care - PPO

## 2016-06-27 DIAGNOSIS — R7989 Other specified abnormal findings of blood chemistry: Secondary | ICD-10-CM | POA: Diagnosis not present

## 2016-06-27 LAB — CBC WITH DIFFERENTIAL/PLATELET
BASOS ABS: 0 10*3/uL (ref 0.0–0.1)
Basophils Relative: 0.5 % (ref 0.0–3.0)
EOS ABS: 0.2 10*3/uL (ref 0.0–0.7)
Eosinophils Relative: 2.4 % (ref 0.0–5.0)
HEMATOCRIT: 45.4 % (ref 39.0–52.0)
Hemoglobin: 15.4 g/dL (ref 13.0–17.0)
LYMPHS ABS: 2.2 10*3/uL (ref 0.7–4.0)
LYMPHS PCT: 32.9 % (ref 12.0–46.0)
MCHC: 34 g/dL (ref 30.0–36.0)
MCV: 88.7 fl (ref 78.0–100.0)
MONOS PCT: 8.4 % (ref 3.0–12.0)
Monocytes Absolute: 0.6 10*3/uL (ref 0.1–1.0)
NEUTROS ABS: 3.8 10*3/uL (ref 1.4–7.7)
NEUTROS PCT: 55.8 % (ref 43.0–77.0)
PLATELETS: 297 10*3/uL (ref 150.0–400.0)
RBC: 5.12 Mil/uL (ref 4.22–5.81)
RDW: 13.2 % (ref 11.5–15.5)
WBC: 6.7 10*3/uL (ref 4.0–10.5)

## 2016-06-27 LAB — COMPREHENSIVE METABOLIC PANEL
ALBUMIN: 4.4 g/dL (ref 3.5–5.2)
ALK PHOS: 53 U/L (ref 39–117)
ALT: 19 U/L (ref 0–53)
AST: 19 U/L (ref 0–37)
BILIRUBIN TOTAL: 0.6 mg/dL (ref 0.2–1.2)
BUN: 11 mg/dL (ref 6–23)
CALCIUM: 9.6 mg/dL (ref 8.4–10.5)
CO2: 28 meq/L (ref 19–32)
CREATININE: 0.98 mg/dL (ref 0.40–1.50)
Chloride: 106 mEq/L (ref 96–112)
GFR: 85.16 mL/min (ref >60.00)
Glucose, Bld: 97 mg/dL (ref 70–99)
Potassium: 4.8 mEq/L (ref 3.5–5.1)
Sodium: 140 mEq/L (ref 135–145)
TOTAL PROTEIN: 7.1 g/dL (ref 6.0–8.3)

## 2016-06-27 LAB — TESTOSTERONE: Testosterone: 282.23 ng/dL — ABNORMAL LOW (ref 300.00–890.00)

## 2016-06-27 LAB — PSA: PSA: 1.9 ng/mL (ref 0.10–4.00)

## 2016-10-09 ENCOUNTER — Telehealth: Payer: Self-pay

## 2016-10-09 NOTE — Telephone Encounter (Signed)
PLEASE NOTE: All timestamps contained within this report are represented as [place] Standard Time. CONFIDENTIALTY NOTICE: This fax transmission is intended only for the addressee. It contains information that is legally privileged, confidential or otherwise protected from use or disclosure. If you are not the intended recipient, you are strictly prohibited from reviewing, disclosing, copying using or disseminating any of this information or taking any action in reliance on or regarding this information. If you have received this fax in error, please notify us immediately by telephone so that we can arrange for its return to [place]. Phone: [phone], Toll-Free: [phone], Fax: [phone] Page: 1 of 1 Call Id: [id number] [place] Patient Name: Jeremiah Carpenter Gender: Male DOB: 12/31/1965 Age: 51 Y 11 M 2 D Return Phone Number: [phone] (Primary) City/State/Zip: [place] [address] Client [facility] - Client Client Site [place] - Day Physician Arnette Norris - MD Who Is Calling Patient / Member / Family / Caregiver Call Type Triage / Clinical Relationship To Patient Self Return Phone Number [phone] (Primary) Chief Complaint Tick Bite Reason for Call Symptomatic / Request for [place] states that he had a few tick bites. The one between his toes is very itchy. Appointment Disposition EMR Appointment Not Necessary Info pasted into Epic No Nurse Assessment Nurse: Joline Salt, RN, Malachy Mood Date/Time Eilene Ghazi Time): 10/08/2016 5:19:58 PM Confirm and document reason for call. If symptomatic, describe symptoms. ---Caller states that he had a few tick bites today. The one on left foot between 2nd and middle and his toes is very itchy. He had been riding a tractor wearing sandals. He was diagnosed with Lymes disease in 1994.  No fever. Does the PT have any chronic conditions? (i.e. diabetes, asthma, etc.) ---No Guidelines Guideline Title Affirmed Question Tick Bite Tick bite with no complications Disp. Time Eilene Ghazi Time) Disposition Final User 10/08/2016 5:28:30 No Name, RN, [facility] Advice Given Per Guideline HOME CARE: You should be able to treat this at home. REASSURANCE AND EDUCATION: Most tick bites are harmless and can be treated at home. The spread of disease by ticks is rare. ANTIBIOTIC OINTMENT: Wash the wound and your hands with soap and water after removal to prevent catching any tick disease. Apply antibiotic ointment (OTC) to the bite once. CALL BACK IF: * You can't remove the tick or the tick's head * Fever or rash occur in the next 2 weeks * Bite begins to look infected * You become worse. CARE ADVICE given per Tick Bites (Adult) guideline.

## 2016-12-03 ENCOUNTER — Other Ambulatory Visit: Payer: Self-pay | Admitting: Family Medicine

## 2017-01-16 ENCOUNTER — Ambulatory Visit: Payer: Commercial Managed Care - PPO | Admitting: Family Medicine

## 2017-01-16 ENCOUNTER — Encounter: Payer: Self-pay | Admitting: Family Medicine

## 2017-01-16 ENCOUNTER — Ambulatory Visit (INDEPENDENT_AMBULATORY_CARE_PROVIDER_SITE_OTHER): Payer: Self-pay

## 2017-01-16 ENCOUNTER — Ambulatory Visit (INDEPENDENT_AMBULATORY_CARE_PROVIDER_SITE_OTHER): Payer: Commercial Managed Care - PPO | Admitting: Surgery

## 2017-01-16 ENCOUNTER — Encounter (INDEPENDENT_AMBULATORY_CARE_PROVIDER_SITE_OTHER): Payer: Self-pay | Admitting: Surgery

## 2017-01-16 VITALS — Ht 65.0 in | Wt 190.0 lb

## 2017-01-16 VITALS — BP 130/82 | HR 93 | Temp 98.0°F | Ht 65.0 in | Wt 187.4 lb

## 2017-01-16 DIAGNOSIS — I771 Stricture of artery: Secondary | ICD-10-CM | POA: Diagnosis not present

## 2017-01-16 DIAGNOSIS — R55 Syncope and collapse: Secondary | ICD-10-CM | POA: Diagnosis not present

## 2017-01-16 DIAGNOSIS — F439 Reaction to severe stress, unspecified: Secondary | ICD-10-CM | POA: Diagnosis not present

## 2017-01-16 DIAGNOSIS — M542 Cervicalgia: Secondary | ICD-10-CM

## 2017-01-16 DIAGNOSIS — R42 Dizziness and giddiness: Secondary | ICD-10-CM

## 2017-01-16 DIAGNOSIS — M4322 Fusion of spine, cervical region: Secondary | ICD-10-CM

## 2017-01-16 LAB — CBC
HEMATOCRIT: 47 % (ref 39.0–52.0)
HEMOGLOBIN: 15.5 g/dL (ref 13.0–17.0)
MCHC: 33 g/dL (ref 30.0–36.0)
MCV: 90.7 fl (ref 78.0–100.0)
PLATELETS: 296 10*3/uL (ref 150.0–400.0)
RBC: 5.18 Mil/uL (ref 4.22–5.81)
RDW: 13.3 % (ref 11.5–15.5)
WBC: 7.3 10*3/uL (ref 4.0–10.5)

## 2017-01-16 LAB — BASIC METABOLIC PANEL
BUN: 12 mg/dL (ref 6–23)
CHLORIDE: 102 meq/L (ref 96–112)
CO2: 33 mEq/L — ABNORMAL HIGH (ref 19–32)
Calcium: 9.8 mg/dL (ref 8.4–10.5)
Creatinine, Ser: 0.95 mg/dL (ref 0.40–1.50)
GFR: 88.08 mL/min (ref >60.00)
Glucose, Bld: 56 mg/dL — ABNORMAL LOW (ref 70–99)
POTASSIUM: 4.2 meq/L (ref 3.5–5.1)
SODIUM: 141 meq/L (ref 135–145)

## 2017-01-16 LAB — URINALYSIS, ROUTINE W REFLEX MICROSCOPIC
Bilirubin Urine: NEGATIVE
Ketones, ur: NEGATIVE
LEUKOCYTES UA: NEGATIVE
NITRITE: NEGATIVE
SPECIFIC GRAVITY, URINE: 1.015 (ref 1.000–1.030)
Total Protein, Urine: NEGATIVE
Urine Glucose: NEGATIVE
Urobilinogen, UA: 0.2 (ref 0.0–1.0)
pH: 6 (ref 5.0–8.0)

## 2017-01-16 MED ORDER — PAROXETINE HCL 10 MG PO TABS: 10.0000 mg | ORAL_TABLET | Freq: Every day | ORAL | 1 refills | Status: DC

## 2017-01-16 NOTE — Progress Notes (Signed)
Office Visit Note   Patient: Jeremiah Carpenter           Date of Birth: Apr 22, 1965           MRN: [id number] Visit Date: 01/16/2017              Requested by: Jeremiah Passy, MD [place], [place] [address] PCP: Jeremiah Passy, MD   Assessment & Plan: Visit Diagnoses:  1. Neck pain   2. Fusion of spine, cervical region   3. Syncope, unspecified syncope type     Plan: Advised patient that I am concerned about the lightheadedness and dizziness that he is describing. I contacted JeremiahAron's office today and patient will see Dr. Alfonso Carpenter in that practice this afternoon at 2 PM.  Advised patient that I do not want to prescribe any medications due to the symptoms that he is having and I do not want to possibly exacerbate that. He voices understanding. follow-up with me in 3 weeks for recheck.   Follow-Up Instructions: Return in about 3 weeks (around 02/06/2017) for Jeremiah Carpenter.   Orders:  Orders Placed This Encounter  Procedures  . XR Cervical Spine 2 or 3 views   No orders of the defined types were placed in this encounter.     Procedures: No procedures performed   Clinical Data: No additional findings.   Subjective: Chief Complaint  Patient presents with  . Neck - Pain    HPI Patient comes in today with complaints of neck pain and feeling lightheaded and dizzy.  Patient is status post C5-6 and C6-7 ACDF by Jeremiah Carpenter 01/11/2016. He was last seen in the office by Dr. Lorin Carpenter 03/28/2016 and was released. 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 Patient's work also involves him doing strenuous activity. States that he's had some increased soreness in his neck and at times feels like his neck is "swollen". He has never noticed any visible swelling but states that he just feels that way. Denies dysphagia, dyspnea, acid reflux. For wall patient has been having issues  with feeling lightheaded and dizzy and was wondering if this could be related to his neck. States that he has these episodes daily and becoming more frequent. Occurs when he goes from a sitting to standing position but this can also come on when he is up around walking. Not complaining of any chest pain. He also did eventually tell me that at times his head feels like it has pressure. He is not yet discussed this with his primary care physician Jeremiah Carpenter.  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 Review of Systems  HENT: Negative.   Respiratory: Negative.   Cardiovascular: Negative.   Musculoskeletal: Positive for neck pain and neck stiffness.  Neurological: Positive for dizziness, syncope and light-headedness. Negative for weakness and numbness.       Feeling of head pressure  Psychiatric/Behavioral: Negative.      Objective: Vital Signs: Ht 5\' 5"  (1.651 m)   Wt 190 lb (86.2 kg)   BMI 31.62 kg/m   Physical Exam  Constitutional: He is oriented to person, place, and time. He appears well-developed and well-nourished. No distress.  Pleasant white male alert and oriented in no acute distress.  HENT:  Head: Normocephalic and atraumatic.  Eyes: Pupils  are equal, round, and reactive to light.  Neck:  Does have some neck stiffness. Positive bilateral brachial plexus trapezius and scapular tenderness.  No visible neck swelling.  Pulmonary/Chest: No respiratory distress.  Musculoskeletal:  Neurovascularly intact. No focal motor deficits.  Neurological: He is alert and oriented to person, place, and time.  Skin: Skin is warm and dry.  Psychiatric: He has a normal mood and affect.    Ortho Exam  Specialty Comments:  No specialty comments available.  Imaging: No results found.   PMFS History: Patient Active Problem List   Diagnosis Date Noted  . Visit for well man health check 03/08/2016  . Elevated blood pressure  09/19/2015  . Hx of adenomatous polyp of colon 05/19/2014  . Low testosterone 05/06/2012  . Fatigue 09/03/2011  . HYPERCHOLESTEROLEMIA 06/21/2009  . PES PLANUS 05/18/2009   Past Medical History:  Diagnosis Date  . History of colon polyps    benign  . History of kidney stones   . History of migraine    last one about a month ago  . Hyperlipidemia    takes Atorvastatin daily  . Joint pain   . Weakness    numbness mainly left hand and rarely on right    Family History  Problem Relation Age of Onset  . Cancer Mother        lung  . Colon cancer Neg Hx     Past Surgical History:  Procedure Laterality Date  . ANTERIOR CERVICAL DECOMP/DISCECTOMY FUSION N/A 01/11/2016   Procedure: C5-6, C6-7 Anterior Cervical Discectomy and Fusion, Allograft, Plate;  Surgeon: Jeremiah Killings, MD;  Location: [facility];  Service: Orthopedics;  Laterality: N/A;  . CARPAL TUNNEL RELEASE Bilateral   . COLONOSCOPY    . VASECTOMY     Social History   Occupational History  . Not on file  Tobacco Use  . Smoking status: Former Research scientist (life sciences)  . Smokeless tobacco: Never Used  . Tobacco comment: quit smoking 15+ yrs ago  Substance and Sexual Activity  . Alcohol use: Yes    Alcohol/week: 0.0 oz    Comment: occasionally  . Drug use: No  . Sexual activity: Yes

## 2017-01-16 NOTE — Progress Notes (Signed)
Subjective:  Patient ID: Jeremiah Carpenter, male    DOB: Sep 05, 1965  Age: 51 y.o. MRN: [id number]  CC: syncope issues   HPI Jeremiah Carpenter presents for evaluation of lightheadedness and dizziness.  He denies frank vertigo.  He is status post cervical fusion 1 year ago.  He saw his orthopedist today because he thought it may have been related to his cervical fusion.  Orthostatics in their office were normal.  They recommended that he be seen by his primary care doctor.  He has a significant family history of vascular disease.  His father had bypass surgery in his early 42s.  His mother passed from a cancer.  Patient quit smoking many years ago.  He is taking Lipitor 10 mg for his abnormal lipid profile.  His last LDL cholesterol was 106.  His triglycerides are elevated.  His HDL is low.  He says that his lightheadedness has been going on for quite some time now.  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  His job consists of having to look up for extended periods of time.  He does correlate his symptoms with looking up at times.  He denies headaches chest pain or shortness of breath.  He does not use illicit drugs.  Outpatient Medications Prior to Visit  Medication Sig Dispense Refill  . aspirin 81 MG tablet Take 81 mg by mouth daily.    Marland Kitchen atorvastatin (LIPITOR) 10 MG tablet TAKE 1 TABLET AT BEDTIME 90 tablet 1  . testosterone (ANDROGEL) 50 MG/5GM (1%) GEL Place 10 g onto the skin daily. 180 Package 3  . oseltamivir (TAMIFLU) 75 MG capsule Take 1 capsule (75 mg total) by mouth 2 (two) times daily. 10 capsule 0   No facility-administered medications prior to visit.     ROS Review of Systems  Constitutional: Negative.   HENT: Negative.   Eyes: Negative.   Respiratory: Negative for shortness of breath.   Cardiovascular: Negative for chest pain and palpitations.  Gastrointestinal: Negative.   Genitourinary: Negative.   Musculoskeletal: Negative for neck pain.  Neurological: Positive for  light-headedness. Negative for dizziness, tremors, seizures, syncope, speech difficulty, weakness, numbness and headaches.  Psychiatric/Behavioral: Positive for sleep disturbance. Negative for behavioral problems, dysphoric mood, self-injury and suicidal ideas.    Objective:  BP 130/82   Pulse 93   Temp 98 F (36.7 C) (Oral)   Ht 5\' 5"  (1.651 m)   Wt 187 lb 6 oz (85 kg)   SpO2 98%   BMI 31.18 kg/m   BP Readings from Last 3 Encounters:  01/16/17 130/82  03/28/16 127/88  03/08/16 116/80    Wt Readings from Last 3 Encounters:  01/16/17 187 lb 6 oz (85 kg)  01/16/17 190 lb (86.2 kg)  03/28/16 190 lb (86.2 kg)    Physical Exam  Constitutional: He is oriented to person, place, and time. He appears well-developed and well-nourished. No distress.  HENT:  Head: Normocephalic and atraumatic.  Right Ear: External ear normal.  Left Ear: External ear normal.  Mouth/Throat: Oropharynx is clear and moist. No oropharyngeal exudate.  Eyes: Conjunctivae and EOM are normal. Pupils are equal, round, and reactive to light. Right eye exhibits no discharge. Left eye exhibits no discharge. No scleral icterus.  Neck: Neck supple. No JVD present. No tracheal deviation present. No thyromegaly present.  Cardiovascular: Normal rate, regular rhythm and normal heart sounds.  Pulses:      Carotid pulses are 0 on the right side, and 0  on the left side. Pulmonary/Chest: Effort normal and breath sounds normal. No stridor.  Abdominal: Bowel sounds are normal.  Lymphadenopathy:    He has no cervical adenopathy.  Neurological: He is alert and oriented to person, place, and time.  Skin: Skin is warm and dry. He is not diaphoretic.  Psychiatric: He has a normal mood and affect. His behavior is normal.    Lab Results  Component Value Date   WBC 6.7 06/27/2016   HGB 15.4 06/27/2016   HCT 45.4 06/27/2016   PLT 297.0 06/27/2016   GLUCOSE 97 06/27/2016   CHOL 199 03/05/2016   TRIG 255.0 (H) 03/05/2016    HDL 37.90 (L) 03/05/2016   LDLDIRECT 104.0 03/05/2016   LDLCALC 114 (H) 03/23/2013   ALT 19 06/27/2016   AST 19 06/27/2016   NA 140 06/27/2016   K 4.8 06/27/2016   CL 106 06/27/2016   CREATININE 0.98 06/27/2016   BUN 11 06/27/2016   CO2 28 06/27/2016   TSH 1.44 09/03/2011   PSA 1.90 06/27/2016   INR 1.00 01/04/2016   HGBA1C 5.4 09/03/2011    Dg Chest 2 View  Result Date: 01/04/2016 CLINICAL DATA:  Preoperative radiograph post cervical fusion. EXAM: CHEST  2 VIEW COMPARISON:  09/03/2011 FINDINGS: The heart size and mediastinal contours are within normal limits. Both lungs are clear. The visualized skeletal structures are unremarkable. IMPRESSION: No active cardiopulmonary disease. Electronically Signed   By: Fidela Salisbury M.D.   On: 01/04/2016 10:10    Assessment & Plan:   Jeremiah Carpenter was seen today for syncope issues.  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   I have discontinued Jeremiah Carpenter. Weyand's oseltamivir. I am also having him start on PARoxetine. Additionally, I am having him maintain his aspirin, testosterone, and atorvastatin.   I ordered a CT arteriogram of his neck to assess blood flow through his vertebral arteries.  I felt that this test was warranted due to his history of abnormal lipid profile, family history of vascular disease, and his recent neck fusion surgery.  I am concerned about blood flow through his vertebral arteries.  He will start Paxil today.  Hopefully this will help with the stress that he feels.  He will follow-up with Dr. Deborra Medina in 1 month.  Meds ordered this encounter  Medications  .  PARoxetine (PAXIL) 10 MG tablet    Sig: Take 1 tablet (10 mg total) by mouth daily.    Dispense:  30 tablet    Refill:  1     Follow-up: No Follow-up on file.  Libby Maw, MD

## 2017-01-16 NOTE — Patient Instructions (Addendum)
Dizziness Dizziness is a common problem. It is a feeling of unsteadiness or light-headedness. You may feel like you are about to faint. Dizziness can lead to injury if you stumble or fall. Anyone can become dizzy, but dizziness is more common in older adults. This condition can be caused by a number of things, including medicines, dehydration, or illness. Follow these instructions at home: Taking these steps may help with your condition: Eating and drinking  Drink enough fluid to keep your urine clear or pale yellow. This helps to keep you from becoming dehydrated. Try to drink more clear fluids, such as water.  Do not drink alcohol.  Limit your caffeine intake if directed by your health care provider.  Limit your salt intake if directed by your health care provider. Activity  Avoid making quick movements. ? Rise slowly from chairs and steady yourself until you feel okay. ? In the morning, first sit up on the side of the bed. When you feel okay, stand slowly while you hold onto something until you know that your balance is fine.  Move your legs often if you need to stand in one place for a long time. Tighten and relax your muscles in your legs while you are standing.  Do not drive or operate heavy machinery if you feel dizzy.  Avoid bending down if you feel dizzy. Place items in your home so that they are easy for you to reach without leaning over. Lifestyle  Do not use any tobacco products, including cigarettes, chewing tobacco, or electronic cigarettes. If you need help quitting, ask your health care provider.  Try to reduce your stress level, such as with yoga or meditation. Talk with your health care provider if you need help. General instructions  Watch your dizziness for any changes.  Take medicines only as directed by your health care provider. Talk with your health care provider if you think that your dizziness is caused by a medicine that you are taking.  Tell a friend or  a family member that you are feeling dizzy. If he or she notices any changes in your behavior, have this person call your health care provider.  Keep all follow-up visits as directed by your health care provider. This is important. Contact a health care provider if:  Your dizziness does not go away.  Your dizziness or light-headedness gets worse.  You feel nauseous.  You have reduced hearing.  You have new symptoms.  You are unsteady on your feet or you feel like the room is spinning. Get help right away if:  You vomit or have diarrhea and are unable to eat or drink anything.  You have problems talking, walking, swallowing, or using your arms, hands, or legs.  You feel generally weak.  You are not thinking clearly or you have trouble forming sentences. It may take a friend or family member to notice this.  You have chest pain, abdominal pain, shortness of breath, or sweating.  Your vision changes.  You notice any bleeding.  You have a headache.  You have neck pain or a stiff neck.  You have a fever. This information is not intended to replace advice given to you by your health care provider. Make sure you discuss any questions you have with your health care provider. Document Released: 08/08/2000 Document Revised: 07/21/2015 Document Reviewed: 02/08/2014 Elsevier Interactive Patient Education  2017 [place]. Paroxetine tablets What is this medicine? PAROXETINE (pa ROX e teen) is used to treat depression. It may  also be used to treat anxiety disorders, obsessive compulsive disorder, panic attacks, post traumatic stress, and premenstrual dysphoric disorder (PMDD). This medicine may be used for other purposes; ask your health care provider or pharmacist if you have questions. COMMON BRAND NAME(S): Paxil, Pexeva What should I tell my health care provider before I take this medicine? They need to know if you have any of these conditions: -bipolar disorder or a family  history of bipolar disorder -bleeding disorders -glaucoma -heart disease -kidney disease -liver disease -low levels of sodium in the blood -seizures -suicidal thoughts, plans, or attempt; a previous suicide attempt by you or a family member -take MAOIs like Carbex, Eldepryl, Marplan, Nardil, and Parnate -take medicines that treat or prevent blood clots -thyroid disease -an unusual or allergic reaction to paroxetine, other medicines, foods, dyes, or preservatives -pregnant or trying to get pregnant -breast-feeding How should I use this medicine? Take this medicine by mouth with a glass of water. Follow the directions on the prescription label. You can take it with or without food. Take your medicine at regular intervals. Do not take your medicine more often than directed. Do not stop taking this medicine suddenly except upon the advice of your doctor. Stopping this medicine too quickly may cause serious side effects or your condition may worsen. A special MedGuide will be given to you by the pharmacist with each prescription and refill. Be sure to read this information carefully each time. Talk to your pediatrician regarding the use of this medicine in children. Special care may be needed. Overdosage: If you think you have taken too much of this medicine contact a poison control center or emergency room at once. NOTE: This medicine is only for you. Do not share this medicine with others. What if I miss a dose? If you miss a dose, take it as soon as you can. If it is almost time for your next dose, take only that dose. Do not take double or extra doses. What may interact with this medicine? Do not take this medicine with any of the following medications: -linezolid -MAOIs like Carbex, Eldepryl, Marplan, Nardil, and Parnate -methylene blue (injected into a vein) -pimozide -thioridazine This medicine may also interact with the following medications: -alcohol -amphetamines -aspirin and  aspirin-like medicines -atomoxetine -certain medicines for depression, anxiety, or psychotic disturbances -certain medicines for irregular heart beat like propafenone, flecainide, encainide, and quinidine -certain medicines for migraine headache like almotriptan, eletriptan, frovatriptan, naratriptan, rizatriptan, sumatriptan, zolmitriptan -cimetidine -digoxin -diuretics -fentanyl -fosamprenavir -furazolidone -isoniazid -lithium -medicines that treat or prevent blood clots like warfarin, enoxaparin, and dalteparin -medicines for sleep -NSAIDs, medicines for pain and inflammation, like ibuprofen or naproxen -phenobarbital -phenytoin -procarbazine -rasagiline -ritonavir -supplements like St. John's wort, kava kava, valerian -tamoxifen -tramadol -tryptophan This list may not describe all possible interactions. Give your health care provider a list of all the medicines, herbs, non-prescription drugs, or dietary supplements you use. Also tell them if you smoke, drink alcohol, or use illegal drugs. Some items may interact with your medicine. What should I watch for while using this medicine? Tell your doctor if your symptoms do not get better or if they get worse. Visit your doctor or health care professional for regular checks on your progress. Because it may take several weeks to see the full effects of this medicine, it is important to continue your treatment as prescribed by your doctor. Patients and their families should watch out for new or worsening thoughts of suicide or depression. Also watch  out for sudden changes in feelings such as feeling anxious, agitated, panicky, irritable, hostile, aggressive, impulsive, severely restless, overly excited and hyperactive, or not being able to sleep. If this happens, especially at the beginning of treatment or after a change in dose, call your health care professional. Dennis Bast may get drowsy or dizzy. Do not drive, use machinery, or do anything that  needs mental alertness until you know how this medicine affects you. Do not stand or sit up quickly, especially if you are an older patient. This reduces the risk of dizzy or fainting spells. Alcohol may interfere with the effect of this medicine. Avoid alcoholic drinks. Your mouth may get dry. Chewing sugarless gum or sucking hard candy, and drinking plenty of water will help. Contact your doctor if the problem does not go away or is severe. What side effects may I notice from receiving this medicine? Side effects that you should report to your doctor or health care professional as soon as possible: -allergic reactions like skin rash, itching or hives, swelling of the face, lips, or tongue -anxious -black, tarry stools -changes in vision -confusion -elevated mood, decreased need for sleep, racing thoughts, impulsive behavior -eye pain -fast, irregular heartbeat -feeling faint or lightheaded, falls -feeling agitated, angry, or irritable -hallucination, loss of contact with reality -loss of balance or coordination -loss of memory -painful or prolonged erections -restlessness, pacing, inability to keep still -seizures -stiff muscles -suicidal thoughts or other mood changes -trouble sleeping -unusual bleeding or bruising -unusually weak or tired -vomiting Side effects that usually do not require medical attention (report to your doctor or health care professional if they continue or are bothersome): -change in appetite or weight -change in sex drive or performance -diarrhea -dizziness -dry mouth -increased sweating -indigestion, nausea -tired -tremors This list may not describe all possible side effects. Call your doctor for medical advice about side effects. You may report side effects to FDA at 1-800-FDA-1088. Where should I keep my medicine? Keep out of the reach of children. Store at room temperature between 15 and 30 degrees C (59 and 86 degrees F). Keep container tightly  closed. Throw away any unused medicine after the expiration date. NOTE: This sheet is a summary. It may not cover all possible information. If you have questions about this medicine, talk to your doctor, pharmacist, or health care provider.  2018 Elsevier/Gold Standard (2015-07-16 15:50:32)

## 2017-01-28 ENCOUNTER — Ambulatory Visit
Admission: RE | Admit: 2017-01-28 | Discharge: 2017-01-28 | Disposition: A | Discharge status: Home / Self Care | Payer: Commercial Managed Care - PPO | Source: Ambulatory Visit | Attending: Family Medicine | Admitting: Family Medicine

## 2017-01-28 DIAGNOSIS — I771 Stricture of artery: Secondary | ICD-10-CM

## 2017-01-28 DIAGNOSIS — R42 Dizziness and giddiness: Secondary | ICD-10-CM

## 2017-01-28 MED ORDER — IOPAMIDOL (ISOVUE-370) INJECTION 76%
75.0000 mL | Freq: Once | INTRAVENOUS | Status: AC | PRN
  Administered 2017-01-28: 75 mL via INTRAVENOUS

## 2017-04-20 ENCOUNTER — Other Ambulatory Visit: Payer: Self-pay | Admitting: Family Medicine

## 2017-04-20 DIAGNOSIS — F439 Reaction to severe stress, unspecified: Secondary | ICD-10-CM

## 2017-05-07 ENCOUNTER — Encounter: Payer: Self-pay | Admitting: Family Medicine

## 2017-05-08 ENCOUNTER — Telehealth: Payer: Self-pay

## 2017-05-08 MED ORDER — TESTOSTERONE 50 MG/5GM (1%) TD GEL: 10.0000 g | Freq: Every day | TRANSDERMAL | 0 refills | Status: DC

## 2017-05-08 NOTE — Telephone Encounter (Signed)
Printed Testosterone per TA/will fax when signed/sched pt for OV/sent MyChart message/thx dmf

## 2017-05-13 ENCOUNTER — Telehealth: Payer: Self-pay | Admitting: Family Medicine

## 2017-05-13 NOTE — Telephone Encounter (Signed)
Copied from [place]. Topic: Quick Communication - See Telephone Encounter >> May 13, 2017  5:22 PM Arletha Grippe wrote: CRM for notification. See Telephone encounter for:   05/13/17. [facility] [place] [facility] - testosterone (ANDROGEL) 50 MG/5GM (1%) GEL is on backorder, they are wanting to change to Vogexo.  Cb for pharm is [phone]

## 2017-05-14 NOTE — Telephone Encounter (Signed)
TA-They spelled it wrong/I googled it and here is the information about it  Vogelxo (testosterone) gel is a clear to translucent hydroalcoholic topical gel for topical use available in unit-dose tubes, unit-dose packets, and multiple-dose metered pumps. ... One pump actuation delivers 12.5 mg testosterone in 1.25 g of gel (4 actuations = 50 mg testosterone).

## 2017-05-14 NOTE — Telephone Encounter (Signed)
Please call pharmacy to get more information.  I do not see Vogexo in our med database. Thanks!

## 2017-05-14 NOTE — Telephone Encounter (Signed)
TA-Plz see note below/Androgel on B/O and would like to sub with Vogexo? Plz advise/thx dmf

## 2017-05-15 MED ORDER — TESTOSTERONE 50 MG/5GM (1%) TD GEL: 10.0000 g | Freq: Every day | TRANSDERMAL | 3 refills | Status: DC

## 2017-05-15 NOTE — Telephone Encounter (Signed)
Thank you. eRx sent. 

## 2017-05-31 ENCOUNTER — Other Ambulatory Visit: Payer: Self-pay

## 2017-06-01 ENCOUNTER — Other Ambulatory Visit: Payer: Self-pay | Admitting: Family Medicine

## 2017-06-03 ENCOUNTER — Encounter: Payer: Self-pay | Admitting: Family Medicine

## 2017-06-03 ENCOUNTER — Ambulatory Visit: Payer: Commercial Managed Care - PPO | Admitting: Family Medicine

## 2017-06-03 VITALS — BP 130/76 | HR 85 | Temp 98.5°F | Ht 65.0 in | Wt 188.0 lb

## 2017-06-03 DIAGNOSIS — R079 Chest pain, unspecified: Secondary | ICD-10-CM

## 2017-06-03 DIAGNOSIS — Z23 Encounter for immunization: Secondary | ICD-10-CM | POA: Diagnosis not present

## 2017-06-03 DIAGNOSIS — R7989 Other specified abnormal findings of blood chemistry: Secondary | ICD-10-CM

## 2017-06-03 LAB — TESTOSTERONE: Testosterone: 293.34 ng/dL — ABNORMAL LOW (ref 300.00–890.00)

## 2017-06-03 LAB — COMPREHENSIVE METABOLIC PANEL
ALK PHOS: 51 U/L (ref 39–117)
ALT: 25 U/L (ref 0–53)
AST: 22 U/L (ref 0–37)
Albumin: 4.3 g/dL (ref 3.5–5.2)
BUN: 16 mg/dL (ref 6–23)
CHLORIDE: 104 meq/L (ref 96–112)
CO2: 27 mEq/L (ref 19–32)
Calcium: 9 mg/dL (ref 8.4–10.5)
Creatinine, Ser: 1.12 mg/dL (ref 0.40–1.50)
GFR: 72.73 mL/min (ref >60.00)
GLUCOSE: 90 mg/dL (ref 70–99)
POTASSIUM: 4.5 meq/L (ref 3.5–5.1)
SODIUM: 141 meq/L (ref 135–145)
Total Bilirubin: 0.6 mg/dL (ref 0.2–1.2)
Total Protein: 7.2 g/dL (ref 6.0–8.3)

## 2017-06-03 LAB — CBC
HEMATOCRIT: 45.6 % (ref 39.0–52.0)
HEMOGLOBIN: 15.7 g/dL (ref 13.0–17.0)
MCHC: 34.4 g/dL (ref 30.0–36.0)
MCV: 90 fl (ref 78.0–100.0)
Platelets: 231 10*3/uL (ref 150.0–400.0)
RBC: 5.07 Mil/uL (ref 4.22–5.81)
RDW: 13.7 % (ref 11.5–15.5)
WBC: 5 10*3/uL (ref 4.0–10.5)

## 2017-06-03 LAB — PSA: PSA: 1.54 ng/mL (ref 0.10–4.00)

## 2017-06-03 NOTE — Addendum Note (Signed)
Addended by: Marrion Coy on: 06/03/2017 11:56 AM   Modules accepted: Orders

## 2017-06-03 NOTE — Addendum Note (Signed)
Addended by: Doy Hutching on: 06/03/2017 12:04 PM   Modules accepted: Orders

## 2017-06-03 NOTE — Patient Instructions (Signed)
Great to see you. I will call you with your lab results from today and you can view them online.   

## 2017-06-03 NOTE — Assessment & Plan Note (Signed)
Currently on Vogelexa. No changes made. Check labs today.

## 2017-06-03 NOTE — Progress Notes (Signed)
Subjective:   Patient ID: Jeremiah Carpenter, male    DOB: [birth year], 52 y.o.   MRN: [id number]  Jeremiah Carpenter is a pleasant 52 y.o. year old male who presents to clinic today with Follow-up (Patient is here today to F/U with testerone level.  He is currently fasting.  He ran out of testosterone while traveling with business.  Androgel went on B/O so Vogelxo was prescribed as a replacement on 3.20.19.  Last Testosterone level was 282.23 on 5.2.18.  He is leaving for [place] on Wednessday.  He agrees to get Tdap today.)  on 06/03/2017  HPI:  Currently taking Vogelxa as that is what his insurance preferred to cover.  Started this end of march.  Due for testosterone level today.  Current Outpatient Medications on File Prior to Visit  Medication Sig Dispense Refill  . aspirin 81 MG tablet Take 81 mg by mouth daily.    Marland Kitchen atorvastatin (LIPITOR) 10 MG tablet TAKE 1 TABLET AT BEDTIME 90 tablet 1  . testosterone (VOGELXO) 50 MG/5GM (1%) GEL Place 10 g onto the skin daily. 1 Package 3   No current facility-administered medications on file prior to visit.     Allergies  Allergen Reactions  . Amoxicillin Hives and Rash    Has patient had a PCN reaction causing immediate rash, facial/tongue/throat swelling, SOB or lightheadedness with hypotension: no Has patient had a PCN reaction causing severe rash involving mucus membranes or skin necrosis:no Has patient had a PCN reaction that required hospitalization no Has patient had a PCN reaction occurring within the last 10 years: yes If all of the above answers are "NO", then may proceed with Cephalosporin use.    Past Medical History:  Diagnosis Date  . History of colon polyps    benign  . History of kidney stones   . History of migraine    last one about a month ago  . Hyperlipidemia    takes Atorvastatin daily  . Joint pain   . Weakness    numbness mainly left hand and rarely on right    Past Surgical History:  Procedure Laterality  Date  . ANTERIOR CERVICAL DECOMP/DISCECTOMY FUSION N/A 01/11/2016   Procedure: C5-6, C6-7 Anterior Cervical Discectomy and Fusion, Allograft, Plate;  Surgeon: Marybelle Killings, MD;  Location: [place];  Service: Orthopedics;  Laterality: N/A;  . CARPAL TUNNEL RELEASE Bilateral   . COLONOSCOPY    . VASECTOMY      Family History  Problem Relation Age of Onset  . Cancer Mother        lung  . Colon cancer Neg Hx     Social History   Socioeconomic History  . Marital status: Married    Spouse name: Not on file  . Number of children: Not on file  . Years of education: Not on file  . Highest education level: Not on file  Occupational History  . Not on file  Social Needs  . Financial resource strain: Not on file  . Food insecurity:    Worry: Not on file    Inability: Not on file  . Transportation needs:    Medical: Not on file    Non-medical: Not on file  Tobacco Use  . Smoking status: Former Research scientist (life sciences)  . Smokeless tobacco: Never Used  . Tobacco comment: quit smoking 15+ yrs ago  Substance and Sexual Activity  . Alcohol use: Yes    Alcohol/week: 0.0 oz    Comment: occasionally  . Drug  use: No  . Sexual activity: Yes  Lifestyle  . Physical activity:    Days per week: Not on file    Minutes per session: Not on file  . Stress: Not on file  Relationships  . Social connections:    Talks on phone: Not on file    Gets together: Not on file    Attends religious service: Not on file    Active member of club or organization: Not on file    Attends meetings of clubs or organizations: Not on file    Relationship status: Not on file  . Intimate partner violence:    Fear of current or ex partner: Not on file    Emotionally abused: Not on file    Physically abused: Not on file    Forced sexual activity: Not on file  Other Topics Concern  . Not on file  Social History Narrative  . Not on file   The PMH, PSH, Social History, Family History, Medications, and allergies have been reviewed  in [facility], and have been updated if relevant.   Review of Systems  Constitutional: Negative.   Respiratory: Negative.   Cardiovascular: Negative.   Genitourinary: Negative.   Musculoskeletal: Negative.   Allergic/Immunologic: Negative for immunocompromised state.  Hematological: Negative.   All other systems reviewed and are negative.      Objective:    BP 130/76 (BP Location: Left Arm, Patient Position: Sitting, Cuff Size: Normal)   Pulse 85   Temp 98.5 F (36.9 C) (Oral)   Ht 5\' 5"  (1.651 m)   Wt 188 lb (85.3 kg)   SpO2 98%   BMI 31.28 kg/m    Physical Exam  Constitutional: He is oriented to person, place, and time. He appears well-developed and well-nourished.  HENT:  Head: Normocephalic and atraumatic.  Right Ear: External ear normal.  Eyes: Pupils are equal, round, and reactive to light.  Neck: Normal range of motion.  Cardiovascular: Normal rate. Exam reveals no gallop and no friction rub.  No murmur heard. Pulmonary/Chest: Effort normal and breath sounds normal.  Musculoskeletal: Normal range of motion.  Neurological: He is alert and oriented to person, place, and time. No cranial nerve deficit.  Skin: Skin is warm and dry. He is not diaphoretic.  Psychiatric: He has a normal mood and affect. His behavior is normal. Judgment and thought content normal.  Nursing note and vitals reviewed.         Assessment & Plan:   Need for Tdap vaccination - Plan: Tdap vaccine greater than or equal to 7yo IM  Low testosterone No follow-ups on file.

## 2017-08-07 ENCOUNTER — Encounter: Payer: Self-pay | Admitting: Family Medicine

## 2017-08-12 ENCOUNTER — Encounter: Payer: Commercial Managed Care - PPO | Admitting: Family Medicine

## 2017-09-13 ENCOUNTER — Other Ambulatory Visit: Payer: Self-pay | Admitting: Family Medicine

## 2017-11-13 ENCOUNTER — Ambulatory Visit (INDEPENDENT_AMBULATORY_CARE_PROVIDER_SITE_OTHER): Payer: Commercial Managed Care - PPO

## 2017-11-13 ENCOUNTER — Ambulatory Visit (INDEPENDENT_AMBULATORY_CARE_PROVIDER_SITE_OTHER): Payer: Commercial Managed Care - PPO | Admitting: Orthopaedic Surgery

## 2017-11-13 ENCOUNTER — Encounter (INDEPENDENT_AMBULATORY_CARE_PROVIDER_SITE_OTHER): Payer: Self-pay | Admitting: Orthopaedic Surgery

## 2017-11-13 VITALS — BP 151/100 | HR 64 | Ht 66.0 in | Wt 196.0 lb

## 2017-11-13 DIAGNOSIS — Z981 Arthrodesis status: Secondary | ICD-10-CM

## 2017-11-13 DIAGNOSIS — M542 Cervicalgia: Secondary | ICD-10-CM | POA: Diagnosis not present

## 2017-11-13 NOTE — Progress Notes (Signed)
Office Visit Note   Patient: Jeremiah Carpenter           Date of Birth: 1966/02/09           MRN: [id number] Visit Date: 11/13/2017              Requested by: Lucille Passy, MD [place], [place] [id number] PCP: Lucille Passy, MD   Assessment & Plan: Visit Diagnoses:  1. Neck pain   2. Hx of fusion of cervical spine     Plan: Post rugby ball to the head injury 1 week ago.  Patient requested x-rays and the show solid fusion.  Negative for acute changes.  His symptoms should resolve with conservative treatment.  He can use Tylenol anti-inflammatories heat, ice, gentle stretching.  Follow-up if he has persistent symptoms.  Follow-Up Instructions: Return if symptoms worsen or fail to improve.   Orders:  Orders Placed This Encounter  Procedures  . XR Cervical Spine 2 or 3 views   No orders of the defined types were placed in this encounter.     Procedures: No procedures performed   Clinical Data: No additional findings.   Subjective: Chief Complaint  Patient presents with  . Neck - Pain    HPI 52 year old male who had previous C5-6, C6-7 2 level fusion on 01/11/2016 has been doing well when he was at a rugby game last week and he was hit in the head and neck with a rugby ball with no loss of consciousness.  No numbness or tingling but has had a sore neck with discomfort.  He thinks is gotten slightly better since last week.  He got good relief of his preop symptoms at the time of his 2017 surgery.  No fever or chills no lower extremity weakness.  Review of Systems positive for hypercholesterol pes planus, fatigue, previous cervical fusion, adenomatous polyp of the colon, low T, otherwise negative as pertains HPI.   Objective: Vital Signs: BP (!) 151/100   Pulse 64   Ht 5\' 6"  (1.676 m)   Wt 196 lb (88.9 kg)   BMI 31.64 kg/m   Physical Exam  Constitutional: He is oriented to person, place, and time. He appears well-developed and well-nourished.    HENT:  Head: Normocephalic and atraumatic.  Eyes: Pupils are equal, round, and reactive to light. EOM are normal.  Neck: No tracheal deviation present. No thyromegaly present.  Cardiovascular: Normal rate.  Pulmonary/Chest: Effort normal. He has no wheezes.  Abdominal: Soft. Bowel sounds are normal.  Neurological: He is alert and oriented to person, place, and time.  Skin: Skin is warm and dry. Capillary refill takes less than 2 seconds.  Psychiatric: He has a normal mood and affect. His behavior is normal. Judgment and thought content normal.    Ortho Exam patient has well-healed anterior incision mild tenderness paraspinals right and left posteriorly.  No brachial plexus tenderness.  Upper extremity reflexes are 2+ no isolated motor weakness negative shoulder impingement normal heel toe gait.  No lower extremity clonus.  Specialty Comments:  No specialty comments available.  Imaging: Xr Cervical Spine 2 Or 3 Views  Result Date: 11/13/2017 AP lateral cervical spine x-ray obtained and reviewed.  This shows previous cervical fusion C5-6 C6-7.  Some loss of cervical lordosis above the fusion.  Negative for acute fracture. Impression: Previous 2 level cervical fusion, C5-6 C6-7.  Negative for acute changes.    PMFS History: Patient Active Problem List   Diagnosis  Date Noted  . Lightheadedness 01/16/2017  . 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 Stress 01/16/2017  . Elevated blood pressure 09/19/2015  . Hx of adenomatous polyp of colon 05/19/2014  . Low testosterone 05/06/2012  . Fatigue 09/03/2011  . HYPERCHOLESTEROLEMIA 06/21/2009  . PES PLANUS 05/18/2009   Past Medical History:  Diagnosis Date  . History of colon polyps    benign  . History of kidney stones   . History of migraine    last one about a month ago  . Hyperlipidemia    takes Atorvastatin daily  . Joint pain   . Weakness    numbness mainly left hand and rarely on right    Family History  Problem  Relation Age of Onset  . Cancer Mother        lung  . Colon cancer Neg Hx     Past Surgical History:  Procedure Laterality Date  . ANTERIOR CERVICAL DECOMP/DISCECTOMY FUSION N/A 01/11/2016   Procedure: C5-6, C6-7 Anterior Cervical Discectomy and Fusion, Allograft, Plate;  Surgeon: Marybelle Killings, MD;  Location: [facility];  Service: Orthopedics;  Laterality: N/A;  . CARPAL TUNNEL RELEASE Bilateral   . COLONOSCOPY    . VASECTOMY     Social History   Occupational History  . Not on file  Tobacco Use  . Smoking status: Former Research scientist (life sciences)  . Smokeless tobacco: Never Used  . Tobacco comment: quit smoking 15+ yrs ago  Substance and Sexual Activity  . Alcohol use: Yes    Alcohol/week: 0.0 standard drinks    Comment: occasionally  . Drug use: No  . Sexual activity: Yes

## 2017-12-01 ENCOUNTER — Other Ambulatory Visit: Payer: Self-pay | Admitting: Family Medicine

## 2018-04-19 NOTE — Progress Notes (Signed)
Subjective:   Patient ID: Jeremiah Carpenter, male    DOB: 02-03-1966, 53 y.o.   MRN: [id number]  Jeremiah Carpenter is a pleasant 53 y.o. year old male who presents to clinic today with Follow-up (Patient is here today to F/U with low-testosterone and Cholesterol.  He currently uses Androgel 2 packets daily.  On 4.8.19 his Testosterone was 293.34.  He has dwindled off of his Lipitor but has lost 30 lbs.  He has had coffee with creamer at 5am only.)  on 04/21/2018  HPI:  Follow up.  Low testosterone- Currently using Androgel as replacement therapy.  Lab Results  Component Value Date   TESTOSTERONE 293.34 (L) 06/03/2017   HLD-  Stopped taking lipitor since he lost 30 pounds by changing his diet and walking more.  Lab Results  Component Value Date   CHOL 199 03/05/2016   HDL 37.90 (L) 03/05/2016   LDLCALC 114 (H) 03/23/2013   LDLDIRECT 104.0 03/05/2016   TRIG 255.0 (H) 03/05/2016   CHOLHDL 5 03/05/2016   Lab Results  Component Value Date   ALT 25 06/03/2017   AST 22 06/03/2017   ALKPHOS 51 06/03/2017   BILITOT 0.6 06/03/2017   Current Outpatient Medications on File Prior to Visit  Medication Sig Dispense Refill  . testosterone (ANDROGEL) 50 MG/5GM (1%) GEL PLACE 10 G (2 PACKETS) ONTO THE SKIN DAILY 300 Package 2  . aspirin 81 MG tablet Take 81 mg by mouth daily.    Marland Kitchen atorvastatin (LIPITOR) 10 MG tablet TAKE 1 TABLET AT BEDTIME (Patient not taking: Reported on 04/21/2018) 90 tablet 1   No current facility-administered medications on file prior to visit.     Allergies  Allergen Reactions  . Amoxicillin Hives and Rash    Has patient had a PCN reaction causing immediate rash, facial/tongue/throat swelling, SOB or lightheadedness with hypotension: no Has patient had a PCN reaction causing severe rash involving mucus membranes or skin necrosis:no Has patient had a PCN reaction that required hospitalization no Has patient had a PCN reaction occurring within the last 10  years: yes If all of the above answers are "NO", then may proceed with Cephalosporin use.    Past Medical History:  Diagnosis Date  . History of colon polyps    benign  . History of kidney stones   . History of migraine    last one about a month ago  . Hyperlipidemia    takes Atorvastatin daily  . Joint pain   . Weakness    numbness mainly left hand and rarely on right    Past Surgical History:  Procedure Laterality Date  . ANTERIOR CERVICAL DECOMP/DISCECTOMY FUSION N/A 01/11/2016   Procedure: C5-6, C6-7 Anterior Cervical Discectomy and Fusion, Allograft, Plate;  Surgeon: Marybelle Killings, MD;  Location: [place];  Service: Orthopedics;  Laterality: N/A;  . CARPAL TUNNEL RELEASE Bilateral   . COLONOSCOPY    . VASECTOMY      Family History  Problem Relation Age of Onset  . Cancer Mother        lung  . Colon cancer Neg Hx     Social History   Socioeconomic History  . Marital status: Married    Spouse name: Not on file  . Number of children: Not on file  . Years of education: Not on file  . Highest education level: Not on file  Occupational History  . Not on file  Social Needs  . Financial resource strain: Not on file  .  Food insecurity:    Worry: Not on file    Inability: Not on file  . Transportation needs:    Medical: Not on file    Non-medical: Not on file  Tobacco Use  . Smoking status: Former Research scientist (life sciences)  . Smokeless tobacco: Never Used  . Tobacco comment: quit smoking 15+ yrs ago  Substance and Sexual Activity  . Alcohol use: Yes    Alcohol/week: 0.0 standard drinks    Comment: occasionally  . Drug use: No  . Sexual activity: Yes  Lifestyle  . Physical activity:    Days per week: Not on file    Minutes per session: Not on file  . Stress: Not on file  Relationships  . Social connections:    Talks on phone: Not on file    Gets together: Not on file    Attends religious service: Not on file    Active member of club or organization: Not on file    Attends  meetings of clubs or organizations: Not on file    Relationship status: Not on file  . Intimate partner violence:    Fear of current or ex partner: Not on file    Emotionally abused: Not on file    Physically abused: Not on file    Forced sexual activity: Not on file  Other Topics Concern  . Not on file  Social History Narrative  . Not on file   The PMH, PSH, Social History, Family History, Medications, and allergies have been reviewed in [facility], and have been updated if relevant.   Review of Systems  Constitutional: Negative.   HENT: Negative.   Eyes: Negative.   Respiratory: Negative.   Cardiovascular: Negative.   Gastrointestinal: Negative.   Endocrine: Negative.   Genitourinary: Negative.   Musculoskeletal: Negative.   Skin: Negative.   Allergic/Immunologic: Negative.   Neurological: Negative.   Hematological: Negative.   Psychiatric/Behavioral: Negative.   All other systems reviewed and are negative.      Objective:    BP 138/88 (BP Location: Left Arm, Patient Position: Sitting, Cuff Size: Normal)   Pulse 64   Temp 98.2 F (36.8 C) (Oral)   Ht _0  (1.676 m)   Wt 169 lb (76.7 kg)   SpO2 98%   BMI 27.28 kg/m    Physical Exam Vitals signs and nursing note reviewed.  Constitutional:      General: He is not in acute distress.    Appearance: Normal appearance. He is normal weight.  HENT:     Head: Normocephalic and atraumatic.     Right Ear: External ear normal.     Left Ear: External ear normal.     Nose: Nose normal.     Mouth/Throat:     Mouth: Mucous membranes are moist.  Eyes:     Extraocular Movements: Extraocular movements intact.     Conjunctiva/sclera: Conjunctivae normal.  Neck:     Musculoskeletal: Normal range of motion.  Cardiovascular:     Rate and Rhythm: Normal rate and regular rhythm.  Pulmonary:     Effort: Pulmonary effort is normal.     Breath sounds: Normal breath sounds.  Musculoskeletal: Normal range of motion.     Right lower  leg: No edema.     Left lower leg: No edema.  Skin:    General: Skin is warm and dry.  Neurological:     General: No focal deficit present.     Mental Status: He is alert and oriented to person,  place, and time. Mental status is at baseline.     Cranial Nerves: No cranial nerve deficit.     Motor: No weakness.     Gait: Gait normal.  Psychiatric:        Mood and Affect: Mood normal.        Behavior: Behavior normal.        Thought Content: Thought content normal.        Judgment: Judgment normal.           Assessment & Plan:   Low testosterone - Plan: Comp Met (CMET), Testosterone  HYPERCHOLESTEROLEMIA - Plan: Comp Met (CMET), Lipid Profile  Long-term current use of testosterone replacement therapy - Plan: CBC with Differential/Platelet, PSA No follow-ups on file.

## 2018-04-21 ENCOUNTER — Encounter: Payer: Self-pay | Admitting: Family Medicine

## 2018-04-21 ENCOUNTER — Ambulatory Visit: Payer: BLUE CROSS/BLUE SHIELD | Admitting: Family Medicine

## 2018-04-21 VITALS — BP 138/88 | HR 64 | Temp 98.2°F | Ht 66.0 in | Wt 169.0 lb

## 2018-04-21 DIAGNOSIS — E7800 Pure hypercholesterolemia, unspecified: Secondary | ICD-10-CM | POA: Diagnosis not present

## 2018-04-21 DIAGNOSIS — Z79890 Hormone replacement therapy (postmenopausal): Secondary | ICD-10-CM | POA: Diagnosis not present

## 2018-04-21 DIAGNOSIS — R7989 Other specified abnormal findings of blood chemistry: Secondary | ICD-10-CM

## 2018-04-21 LAB — COMPREHENSIVE METABOLIC PANEL
ALBUMIN: 4.7 g/dL (ref 3.5–5.2)
ALT: 20 U/L (ref 0–53)
AST: 22 U/L (ref 0–37)
Alkaline Phosphatase: 50 U/L (ref 39–117)
BUN: 16 mg/dL (ref 6–23)
CHLORIDE: 103 meq/L (ref 96–112)
CO2: 27 mEq/L (ref 19–32)
Calcium: 9.7 mg/dL (ref 8.4–10.5)
Creatinine, Ser: 0.96 mg/dL (ref 0.40–1.50)
GFR: 81.49 mL/min (ref >60.00)
Glucose, Bld: 78 mg/dL (ref 70–99)
POTASSIUM: 4.2 meq/L (ref 3.5–5.1)
SODIUM: 140 meq/L (ref 135–145)
Total Bilirubin: 0.8 mg/dL (ref 0.2–1.2)
Total Protein: 7.3 g/dL (ref 6.0–8.3)

## 2018-04-21 LAB — CBC WITH DIFFERENTIAL/PLATELET
Basophils Absolute: 0 10*3/uL (ref 0.0–0.1)
Basophils Relative: 0.3 % (ref 0.0–3.0)
Eosinophils Absolute: 0 10*3/uL (ref 0.0–0.7)
Eosinophils Relative: 0.3 % (ref 0.0–5.0)
HCT: 45.8 % (ref 39.0–52.0)
Hemoglobin: 15.6 g/dL (ref 13.0–17.0)
Lymphocytes Relative: 23.9 % (ref 12.0–46.0)
Lymphs Abs: 1.8 10*3/uL (ref 0.7–4.0)
MCHC: 34 g/dL (ref 30.0–36.0)
MCV: 89.9 fl (ref 78.0–100.0)
Monocytes Absolute: 0.5 10*3/uL (ref 0.1–1.0)
Monocytes Relative: 6.5 % (ref 3.0–12.0)
Neutro Abs: 5.2 10*3/uL (ref 1.4–7.7)
Neutrophils Relative %: 69 % (ref 43.0–77.0)
Platelets: 279 10*3/uL (ref 150.0–400.0)
RBC: 5.1 Mil/uL (ref 4.22–5.81)
RDW: 13.7 % (ref 11.5–15.5)
WBC: 7.6 10*3/uL (ref 4.0–10.5)

## 2018-04-21 LAB — LIPID PANEL
CHOLESTEROL: 219 mg/dL — AB (ref 0–200)
HDL: 48.6 mg/dL (ref >39.00)
LDL CALC: 134 mg/dL — AB (ref 0–99)
NonHDL: 170.56
Total CHOL/HDL Ratio: 5
Triglycerides: 181 mg/dL — ABNORMAL HIGH (ref 0.0–149.0)
VLDL: 36.2 mg/dL (ref 0.0–40.0)

## 2018-04-21 LAB — TESTOSTERONE: Testosterone: 314.8 ng/dL (ref 300.00–890.00)

## 2018-04-21 LAB — PSA: PSA: 1.65 ng/mL (ref 0.10–4.00)

## 2018-04-21 NOTE — Assessment & Plan Note (Signed)
Stopped taking lipitor over a month ago since he has completely changed his diet and has lost 30 pounds in the process.  Will remove it from his medication list.  Check lipid panel today. The patient indicates understanding of these issues and agrees with the plan.

## 2018-04-21 NOTE — Assessment & Plan Note (Signed)
Feels he is doing well with current dose of testosterone. Check labs today. The patient indicates understanding of these issues and agrees with the plan. Orders Placed This Encounter  Procedures  . Comp Met (CMET)  . Lipid Profile  . Testosterone  . CBC with Differential/Platelet  . PSA

## 2018-04-21 NOTE — Patient Instructions (Signed)
Great to see you. You look great!  I will call you with your lab results from today and you can view them online.

## 2018-07-28 ENCOUNTER — Encounter: Payer: Self-pay | Admitting: Family Medicine

## 2018-07-29 ENCOUNTER — Other Ambulatory Visit: Payer: Self-pay

## 2018-07-29 MED ORDER — TESTOSTERONE 50 MG/5GM (1%) TD GEL: TRANSDERMAL | 2 refills | Status: DC

## 2018-07-29 NOTE — Telephone Encounter (Signed)
TA-I have prepared refill for Testosterone as discussed and pended/plz send when you can/thx dmf

## 2018-08-06 ENCOUNTER — Telehealth: Payer: Self-pay | Admitting: Family Medicine

## 2018-08-06 NOTE — Telephone Encounter (Signed)
Copied from [place] [id number]. Topic: General - Other >> Aug 05, 2018 12:29 PM Yvette Rack wrote: Reason for CRM: Janett Billow with [facility] called in and stated a prior authorization is needed for testosterone (ANDROGEL) 50 MG/5GM (1%) GEL.

## 2018-08-12 ENCOUNTER — Telehealth: Payer: Self-pay

## 2018-08-12 NOTE — Telephone Encounter (Signed)
Can you please help with this ?

## 2018-08-20 NOTE — Telephone Encounter (Signed)
See other phone note

## 2018-09-16 NOTE — Telephone Encounter (Signed)
I sent a MyChart message to pt/awaiting response/thx dmf

## 2018-09-16 NOTE — Telephone Encounter (Signed)
Oh wow.  I'm kind of shocked by this one.  Ask pt if he would be okay with this substitution.  If not, we should send him to urology as they have other options.

## 2018-09-16 NOTE — Telephone Encounter (Signed)
Here are the choices that are provided for testosterone replacement therapy: -Danazol cap 50mg  - Tier 4 -Danazol cap 100mg  - Tier 4 -Danazol cap 200mg  - Tier 4 This is all that is offered without a hard time and constant rejections from PA's/plz advise/thx dmf

## 2018-10-13 ENCOUNTER — Encounter: Payer: Self-pay | Admitting: Family Medicine

## 2018-10-14 ENCOUNTER — Other Ambulatory Visit: Payer: Self-pay

## 2018-10-14 DIAGNOSIS — E7800 Pure hypercholesterolemia, unspecified: Secondary | ICD-10-CM

## 2018-10-14 DIAGNOSIS — R7989 Other specified abnormal findings of blood chemistry: Secondary | ICD-10-CM

## 2018-10-14 DIAGNOSIS — R5383 Other fatigue: Secondary | ICD-10-CM

## 2018-10-14 DIAGNOSIS — Z125 Encounter for screening for malignant neoplasm of prostate: Secondary | ICD-10-CM

## 2018-10-14 NOTE — Progress Notes (Signed)
Per TA placed future orders for pt/thx dmf

## 2018-10-22 ENCOUNTER — Other Ambulatory Visit (INDEPENDENT_AMBULATORY_CARE_PROVIDER_SITE_OTHER): Payer: 59

## 2018-10-22 ENCOUNTER — Other Ambulatory Visit: Payer: Self-pay

## 2018-10-22 DIAGNOSIS — R7989 Other specified abnormal findings of blood chemistry: Secondary | ICD-10-CM | POA: Diagnosis not present

## 2018-10-22 DIAGNOSIS — E7800 Pure hypercholesterolemia, unspecified: Secondary | ICD-10-CM

## 2018-10-22 DIAGNOSIS — R5383 Other fatigue: Secondary | ICD-10-CM | POA: Diagnosis not present

## 2018-10-22 DIAGNOSIS — Z125 Encounter for screening for malignant neoplasm of prostate: Secondary | ICD-10-CM

## 2018-10-22 LAB — CBC WITH DIFFERENTIAL/PLATELET
Basophils Absolute: 0 10*3/uL (ref 0.0–0.1)
Basophils Relative: 0.3 % (ref 0.0–3.0)
Eosinophils Absolute: 0.1 10*3/uL (ref 0.0–0.7)
Eosinophils Relative: 2.6 % (ref 0.0–5.0)
HCT: 45.1 % (ref 39.0–52.0)
Hemoglobin: 15.3 g/dL (ref 13.0–17.0)
Lymphocytes Relative: 34.3 % (ref 12.0–46.0)
Lymphs Abs: 1.9 10*3/uL (ref 0.7–4.0)
MCHC: 33.9 g/dL (ref 30.0–36.0)
MCV: 91.3 fl (ref 78.0–100.0)
Monocytes Absolute: 0.4 10*3/uL (ref 0.1–1.0)
Monocytes Relative: 7.8 % (ref 3.0–12.0)
Neutro Abs: 3.1 10*3/uL (ref 1.4–7.7)
Neutrophils Relative %: 55 % (ref 43.0–77.0)
Platelets: 276 10*3/uL (ref 150.0–400.0)
RBC: 4.93 Mil/uL (ref 4.22–5.81)
RDW: 13.3 % (ref 11.5–15.5)
WBC: 5.7 10*3/uL (ref 4.0–10.5)

## 2018-10-22 LAB — COMPREHENSIVE METABOLIC PANEL
ALT: 18 U/L (ref 0–53)
AST: 17 U/L (ref 0–37)
Albumin: 4.6 g/dL (ref 3.5–5.2)
Alkaline Phosphatase: 47 U/L (ref 39–117)
BUN: 19 mg/dL (ref 6–23)
CO2: 28 mEq/L (ref 19–32)
Calcium: 9.6 mg/dL (ref 8.4–10.5)
Chloride: 104 mEq/L (ref 96–112)
Creatinine, Ser: 0.98 mg/dL (ref 0.40–1.50)
GFR: 79.42 mL/min (ref >60.00)
Glucose, Bld: 103 mg/dL — ABNORMAL HIGH (ref 70–99)
Potassium: 4.3 mEq/L (ref 3.5–5.1)
Sodium: 141 mEq/L (ref 135–145)
Total Bilirubin: 0.4 mg/dL (ref 0.2–1.2)
Total Protein: 7 g/dL (ref 6.0–8.3)

## 2018-10-22 LAB — PSA: PSA: 1.84 ng/mL (ref 0.10–4.00)

## 2018-10-23 LAB — TESTOSTERONE TOTAL,FREE,BIO, MALES
Albumin: 4.3 g/dL (ref 3.6–5.1)
Sex Hormone Binding: 27 nmol/L (ref 10–50)
Testosterone, Bioavailable: 100.3 ng/dL — ABNORMAL LOW (ref >=110.0)
Testosterone, Free: 50.9 pg/mL (ref 46.0–224.0)
Testosterone: 334 ng/dL (ref 250–827)

## 2019-01-14 ENCOUNTER — Encounter: Payer: Self-pay | Admitting: Family Medicine

## 2019-01-15 NOTE — Telephone Encounter (Signed)
Please see message and advise.  Thank you. ° °

## 2019-03-18 ENCOUNTER — Telehealth: Payer: Self-pay | Admitting: Family Medicine

## 2019-03-18 DIAGNOSIS — E7800 Pure hypercholesterolemia, unspecified: Secondary | ICD-10-CM

## 2019-03-18 NOTE — Telephone Encounter (Signed)
Tried to call about message sent in: Appointment Request From: Jeremiah Carpenter    With Provider: Arnette Norris, MD [[facility]]    Preferred Date Range: 03/24/2019 - 04/09/2019    Preferred Times: Any Time    Reason for visit: Annual Physical    Comments:  Wellness check   Left message

## 2019-03-18 NOTE — Telephone Encounter (Signed)
Patient has CPE scheduled 03/25/19- patient would like to have labs done prior to appointment at [facility]. Please call patient at [phone].

## 2019-03-18 NOTE — Telephone Encounter (Signed)
TA-Do you want to order any labs for this pt to have prior to CPE on 1.27.21? Plz advise as he has had some in August/thx dmf

## 2019-03-19 ENCOUNTER — Encounter: Payer: Self-pay | Admitting: Family Medicine

## 2019-03-19 NOTE — Telephone Encounter (Signed)
Per verbal order placed cmp & lipid/thx dmf

## 2019-03-20 ENCOUNTER — Other Ambulatory Visit: Payer: Self-pay

## 2019-03-20 ENCOUNTER — Other Ambulatory Visit: Payer: 59

## 2019-03-23 NOTE — Progress Notes (Signed)
Virtual Visit via Video   Due to the COVID-19 pandemic, this visit was completed with telemedicine (audio/video) technology to reduce patient and provider exposure as well as to preserve personal protective equipment.   I connected with [name] by a video enabled telemedicine application and verified that I am speaking with the correct person using two identifiers. Location patient: Home Location provider: [facility], Office Persons participating in the virtual visit: JAWUAN HEAGERTY, Arnette Norris, MD   I discussed the limitations of evaluation and management by telemedicine and the availability of in person appointments. The patient expressed understanding and agreed to proceed.  Care Team   Patient Care Team: Lucille Passy, MD as PCP - General  Subjective:   HPI: Remote visit for CPX and follow up.   Health Maintenance  Topic Date Due  . INFLUENZA VACCINE  05/27/2019 (Originally 09/27/2018)  . COLONOSCOPY  05/14/2019  . TETANUS/TDAP  06/04/2027  . Hepatitis C Screening  Discontinued  . HIV Screening  Discontinued     Review of Systems  Constitutional: Negative.  Negative for fever and malaise/fatigue.  HENT: Negative.  Negative for congestion and hearing loss.   Eyes: Negative.  Negative for blurred vision, discharge and redness.  Respiratory: Negative.  Negative for cough and shortness of breath.   Cardiovascular: Negative.  Negative for chest pain, palpitations and leg swelling.  Gastrointestinal: Negative.  Negative for abdominal pain and heartburn.  Genitourinary: Negative.  Negative for dysuria.  Musculoskeletal: Negative.  Negative for falls.  Skin: Negative.  Negative for rash.  Neurological: Negative.  Negative for loss of consciousness and headaches.  Endo/Heme/Allergies: Negative.  Does not bruise/bleed easily.  Psychiatric/Behavioral: Negative.  Negative for depression and memory loss.  All other systems reviewed and are negative.    Patient  Active Problem List   Diagnosis Date Noted  . Visit for well man health check 03/25/2019  . 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 Hx of adenomatous polyp of colon 05/19/2014  . Low testosterone 05/06/2012  . Fatigue 09/03/2011  . HYPERCHOLESTEROLEMIA 06/21/2009  . PES PLANUS 05/18/2009    Social History   Tobacco Use  . Smoking status: Former Research scientist (life sciences)  . Smokeless tobacco: Never Used  . Tobacco comment: quit smoking 15+ yrs ago  Substance Use Topics  . Alcohol use: Yes    Alcohol/week: 0.0 standard drinks    Comment: occasionally    Current Outpatient Medications:  .  aspirin 81 MG tablet, Take 81 mg by mouth daily., Disp: , Rfl:  .  testosterone (ANDROGEL) 50 MG/5GM (1%) GEL, PLACE 10 G (2 PACKETS) ONTO THE SKIN DAILY, Disp: 300 Package, Rfl: 2  Allergies  Allergen Reactions  . Amoxicillin Hives and Rash    Has patient had a PCN reaction causing immediate rash, facial/tongue/throat swelling, SOB or lightheadedness with hypotension: no Has patient had a PCN reaction causing severe rash involving mucus membranes or skin necrosis:no Has patient had a PCN reaction that required hospitalization no Has patient had a PCN reaction occurring within the last 10 years: yes If all of the above answers are "NO", then may proceed with Cephalosporin use.    Objective:  There were no vitals taken for this visit.     VITALS: Per patient if applicable, see vitals. GENERAL: Alert, appears well and in no acute distress. HEENT: Atraumatic, conjunctiva clear, no obvious abnormalities on inspection of external nose and ears. NECK: Normal movements of  the head and neck. CARDIOPULMONARY: No increased WOB. Speaking in clear sentences. I:E ratio WNL.  MS: Moves all visible extremities without noticeable abnormality. PSYCH: Pleasant and cooperative, well-groomed. Speech normal rate and rhythm. Affect is appropriate. Insight and judgement are  appropriate. Attention is focused, linear, and appropriate.  NEURO: CN grossly intact. Oriented as arrived to appointment on time with no prompting. Moves both UE equally.  SKIN: No obvious lesions, wounds, erythema, or cyanosis noted on face or hands.  Depression screen [facility] 2/9 04/21/2018 06/03/2017  Decreased Interest 0 0  Down, Depressed, Hopeless 0 0  PHQ - 2 Score 0 0     . COVID-19 Education: The signs and symptoms of COVID-19 were discussed with the patient and how to seek care for testing if needed. The importance of social distancing was discussed today. . Reviewed expectations re: course of current medical issues. . Discussed self-management of symptoms. . Outlined signs and symptoms indicating need for more acute intervention. . Patient verbalized understanding and all questions were answered. Marland Kitchen Health Maintenance issues including appropriate healthy diet, exercise, and smoking avoidance were discussed with patient. . See orders for this visit as documented in the electronic medical record.  Arnette Norris, MD     Lab Results  Component Value Date   WBC 5.7 10/22/2018   HGB 15.3 10/22/2018   HCT 45.1 10/22/2018   PLT 276.0 10/22/2018   GLUCOSE 103 (H) 10/22/2018   CHOL 219 (H) 04/21/2018   TRIG 181.0 (H) 04/21/2018   HDL 48.60 04/21/2018   LDLDIRECT 104.0 03/05/2016   LDLCALC 134 (H) 04/21/2018   ALT 18 10/22/2018   AST 17 10/22/2018   NA 141 10/22/2018   K 4.3 10/22/2018   CL 104 10/22/2018   CREATININE 0.98 10/22/2018   BUN 19 10/22/2018   CO2 28 10/22/2018   TSH 1.44 09/03/2011   PSA 1.84 10/22/2018   INR 1.00 01/04/2016   HGBA1C 5.4 09/03/2011    Lab Results  Component Value Date   TSH 1.44 09/03/2011   Lab Results  Component Value Date   WBC 5.7 10/22/2018   HGB 15.3 10/22/2018   HCT 45.1 10/22/2018   MCV 91.3 10/22/2018   PLT 276.0 10/22/2018   Lab Results  Component Value Date   NA 141 10/22/2018   K 4.3 10/22/2018   CO2 28 10/22/2018    GLUCOSE 103 (H) 10/22/2018   BUN 19 10/22/2018   CREATININE 0.98 10/22/2018   BILITOT 0.4 10/22/2018   ALKPHOS 47 10/22/2018   AST 17 10/22/2018   ALT 18 10/22/2018   PROT 7.0 10/22/2018   ALBUMIN 4.6 10/22/2018   CALCIUM 9.6 10/22/2018   ANIONGAP 8 01/04/2016   GFR 79.42 10/22/2018   Lab Results  Component Value Date   CHOL 219 (H) 04/21/2018   Lab Results  Component Value Date   HDL 48.60 04/21/2018   Lab Results  Component Value Date   LDLCALC 134 (H) 04/21/2018   Lab Results  Component Value Date   TRIG 181.0 (H) 04/21/2018   Lab Results  Component Value Date   CHOLHDL 5 04/21/2018   Lab Results  Component Value Date   HGBA1C 5.4 09/03/2011       Assessment & Plan:   Problem List Items Addressed This Visit      Active Problems   Low testosterone - Primary    He is no longer taking testosterone, has been feeling well.  Lab Results  Component Value Date   TESTOSTERONE 334  10/22/2018  he was told hold off on rechecking testosterone labs until he sees his new provider.       Visit for well man health check    Reviewed preventive care protocols, scheduled due services, and updated immunizations Discussed nutrition, exercise, diet, and healthy lifestyle.  Future orders are in the system for a CMP & Lipid which he prefers to have done at [facility].   He is needing to schedule a repeat Colonoscopy with Dr. Carlean Purl @ [facility] due to Diminutive Adenoma for March. Brother passed of Colon Cancer.   He is scheduled for labs at [facility] in February.         I am having Ihor Polka. Farinas "[name]" maintain his aspirin and testosterone.  No orders of the defined types were placed in this encounter.    Arnette Norris, MD

## 2019-03-25 ENCOUNTER — Ambulatory Visit (INDEPENDENT_AMBULATORY_CARE_PROVIDER_SITE_OTHER): Payer: 59 | Admitting: Family Medicine

## 2019-03-25 DIAGNOSIS — R7989 Other specified abnormal findings of blood chemistry: Secondary | ICD-10-CM | POA: Diagnosis not present

## 2019-03-25 DIAGNOSIS — Z0000 Encounter for general adult medical examination without abnormal findings: Secondary | ICD-10-CM | POA: Insufficient documentation

## 2019-03-25 NOTE — Assessment & Plan Note (Signed)
Reviewed preventive care protocols, scheduled due services, and updated immunizations Discussed nutrition, exercise, diet, and healthy lifestyle.  Future orders are in the system for a CMP & Lipid which he prefers to have done at [facility].   He is needing to schedule a repeat Colonoscopy with Dr. Carlean Purl @ [facility] due to Diminutive Adenoma for March. Brother passed of Colon Cancer.   He is scheduled for labs at [facility] in February.

## 2019-03-25 NOTE — Assessment & Plan Note (Signed)
He is no longer taking testosterone, has been feeling well.  Lab Results  Component Value Date   TESTOSTERONE 334 10/22/2018  he was told hold off on rechecking testosterone labs until he sees his new provider.

## 2019-03-31 ENCOUNTER — Other Ambulatory Visit: Payer: Self-pay

## 2019-03-31 ENCOUNTER — Other Ambulatory Visit (INDEPENDENT_AMBULATORY_CARE_PROVIDER_SITE_OTHER): Payer: 59

## 2019-03-31 DIAGNOSIS — E7800 Pure hypercholesterolemia, unspecified: Secondary | ICD-10-CM | POA: Diagnosis not present

## 2019-03-31 LAB — COMPREHENSIVE METABOLIC PANEL
ALT: 17 U/L (ref 0–53)
AST: 17 U/L (ref 0–37)
Albumin: 4.4 g/dL (ref 3.5–5.2)
Alkaline Phosphatase: 40 U/L (ref 39–117)
BUN: 22 mg/dL (ref 6–23)
CO2: 29 mEq/L (ref 19–32)
Calcium: 9.3 mg/dL (ref 8.4–10.5)
Chloride: 103 mEq/L (ref 96–112)
Creatinine, Ser: 1.08 mg/dL (ref 0.40–1.50)
GFR: 70.88 mL/min (ref >60.00)
Glucose, Bld: 95 mg/dL (ref 70–99)
Potassium: 4.1 mEq/L (ref 3.5–5.1)
Sodium: 140 mEq/L (ref 135–145)
Total Bilirubin: 0.7 mg/dL (ref 0.2–1.2)
Total Protein: 7 g/dL (ref 6.0–8.3)

## 2019-03-31 LAB — LIPID PANEL
Cholesterol: 246 mg/dL — ABNORMAL HIGH (ref 0–200)
HDL: 40.3 mg/dL (ref >39.00)
NonHDL: 205.72
Total CHOL/HDL Ratio: 6
Triglycerides: 223 mg/dL — ABNORMAL HIGH (ref 0.0–149.0)
VLDL: 44.6 mg/dL — ABNORMAL HIGH (ref 0.0–40.0)

## 2019-03-31 LAB — LDL CHOLESTEROL, DIRECT: Direct LDL: 157 mg/dL

## 2019-12-03 ENCOUNTER — Other Ambulatory Visit: Payer: Self-pay

## 2019-12-03 ENCOUNTER — Ambulatory Visit: Payer: 59 | Admitting: Family Medicine

## 2019-12-03 ENCOUNTER — Encounter: Payer: Self-pay | Admitting: Family Medicine

## 2019-12-03 ENCOUNTER — Telehealth: Payer: Self-pay

## 2019-12-03 VITALS — BP 120/70 | HR 75 | Temp 98.1°F | Ht 65.0 in | Wt 176.5 lb

## 2019-12-03 DIAGNOSIS — L821 Other seborrheic keratosis: Secondary | ICD-10-CM | POA: Insufficient documentation

## 2019-12-03 DIAGNOSIS — Z79890 Hormone replacement therapy (postmenopausal): Secondary | ICD-10-CM | POA: Insufficient documentation

## 2019-12-03 DIAGNOSIS — E7800 Pure hypercholesterolemia, unspecified: Secondary | ICD-10-CM | POA: Diagnosis not present

## 2019-12-03 DIAGNOSIS — Z86010 Personal history of colonic polyps: Secondary | ICD-10-CM | POA: Diagnosis not present

## 2019-12-03 LAB — LIPID PANEL
Cholesterol: 254 mg/dL — ABNORMAL HIGH (ref 0–200)
HDL: 42.6 mg/dL (ref >39.00)
NonHDL: 211.01
Total CHOL/HDL Ratio: 6
Triglycerides: 348 mg/dL — ABNORMAL HIGH (ref 0.0–149.0)
VLDL: 69.6 mg/dL — ABNORMAL HIGH (ref 0.0–40.0)

## 2019-12-03 LAB — LDL CHOLESTEROL, DIRECT: Direct LDL: 158 mg/dL

## 2019-12-03 MED ORDER — ATORVASTATIN CALCIUM 10 MG PO TABS: 10.0000 mg | ORAL_TABLET | Freq: Every day | ORAL | 3 refills | Status: DC

## 2019-12-03 NOTE — Telephone Encounter (Signed)
This was sent to [facility] as requested.

## 2019-12-03 NOTE — Patient Instructions (Addendum)
Mole - look back at photos - we can continue to watch this  Start cholesterol medication   Return in 6 months for annual physical

## 2019-12-03 NOTE — Assessment & Plan Note (Signed)
Skin lesion seems most consistent with SK. Advised monitoring and looking back at old photos to see if it has been there for longer.

## 2019-12-03 NOTE — Assessment & Plan Note (Signed)
Due for repeat screening. Discussed given hx would need colonoscopy instead of stool testing.

## 2019-12-03 NOTE — Telephone Encounter (Signed)
Pt called and said that [facility] told him they no longer can fill his prescription because the company he work with now uses [employer]. I changed his preferred pharmacy to [facility] and removed [facility]. He needs his atorvastatin prescription sent to [facility] please.

## 2019-12-03 NOTE — Assessment & Plan Note (Signed)
Recheck lipids and restart lipitor. Discussed need for long-term use given family history and elevated cholesterol.

## 2019-12-03 NOTE — Progress Notes (Signed)
Subjective:     Jeremiah Carpenter is a 54 y.o. male presenting for Establish Care (check mole on right cheek)     HPI  #Mole - right cheek - wife and kids noticed that it was new - family thinks it is growing in size - he has not noticed it all   #colon polyps - due for recheck - no symptoms  Review of Systems   Social History   Tobacco Use  Smoking Status Former Smoker  Smokeless Tobacco Never Used  Tobacco Comment   quit smoking 15+ yrs ago        Objective:    BP Readings from Last 3 Encounters:  12/03/19 120/70  04/21/18 138/88  11/13/17 (!) 151/100   Wt Readings from Last 3 Encounters:  12/03/19 176 lb 8 oz (80.1 kg)  04/21/18 169 lb (76.7 kg)  11/13/17 196 lb (88.9 kg)    BP 120/70   Pulse 75   Temp 98.1 F (36.7 C) (Temporal)   Ht 5\' 5"  (1.651 m)   Wt 176 lb 8 oz (80.1 kg)   SpO2 97%   BMI 29.37 kg/m    Physical Exam Constitutional:      Appearance: Normal appearance. He is not ill-appearing or diaphoretic.  HENT:     Right Ear: External ear normal.     Left Ear: External ear normal.  Eyes:     General: No scleral icterus.    Extraocular Movements: Extraocular movements intact.     Conjunctiva/sclera: Conjunctivae normal.  Cardiovascular:     Rate and Rhythm: Normal rate and regular rhythm.     Heart sounds: No murmur heard.   Pulmonary:     Effort: Pulmonary effort is normal. No respiratory distress.     Breath sounds: Normal breath sounds. No wheezing.  Musculoskeletal:     Cervical back: Neck supple.  Skin:    General: Skin is warm and dry.     Comments: Raised brown rough textured <5 mm skin lesion on the right cheek  Neurological:     Mental Status: He is alert. Mental status is at baseline.  Psychiatric:        Mood and Affect: Mood normal.         The 10-year ASCVD risk score Jeremiah Carpenter., et al., 2013) is: 8.2%   Values used to calculate the score:     Age: 60 years     Sex: Male     Is Non-Hispanic  African American: No     Diabetic: No     Tobacco smoker: No     Systolic Blood Pressure: 294 mmHg     Is BP treated: No     HDL Cholesterol: 40.3 mg/dL     Total Cholesterol: 246 mg/dL     Assessment & Plan:   Problem List Items Addressed This Visit      Musculoskeletal and Integument   Seborrheic keratoses    Skin lesion seems most consistent with SK. Advised monitoring and looking back at old photos to see if it has been there for longer.         Other   HYPERCHOLESTEROLEMIA - Primary    Recheck lipids and restart lipitor. Discussed need for long-term use given family history and elevated cholesterol.       Relevant Orders   Lipid panel   Hx of adenomatous polyp of colon    Due for repeat screening. Discussed given hx would need colonoscopy instead of stool  testing.       Relevant Orders   Ambulatory referral to Gastroenterology       Return in about 6 months (around 06/02/2020).  Lesleigh Noe, MD  This visit occurred during the SARS-CoV-2 public health emergency.  Safety protocols were in place, including screening questions prior to the visit, additional usage of staff PPE, and extensive cleaning of exam room while observing appropriate contact time as indicated for disinfecting solutions.

## 2019-12-17 ENCOUNTER — Other Ambulatory Visit: Payer: Self-pay

## 2019-12-17 ENCOUNTER — Telehealth (INDEPENDENT_AMBULATORY_CARE_PROVIDER_SITE_OTHER): Payer: Self-pay | Admitting: Gastroenterology

## 2019-12-17 DIAGNOSIS — Z86010 Personal history of colonic polyps: Secondary | ICD-10-CM

## 2019-12-17 MED ORDER — CLENPIQ 10-3.5-12 MG-GM -GM/160ML PO SOLN
1.0000 | Freq: Once | ORAL | 0 refills | Status: AC
Start: 2019-12-17 — End: 2019-12-17

## 2019-12-17 NOTE — Progress Notes (Signed)
Gastroenterology Pre-Procedure Review  Request Date: 01/25/20 Requesting Physician: Dr. Vicente Males  PATIENT REVIEW QUESTIONS: The patient responded to the following health history questions as indicated:    1. Are you having any GI issues? no 2. Do you have a personal history of Polyps? yes (05/14/14 Colon polyps noted on colonoscopy performed by Dr. Carlean Purl at [facility]) 3. Do you have a family history of Colon Cancer or Polyps? no 4. Diabetes Mellitus? no 5. Joint replacements in the past 12 months?no 6. Major health problems in the past 3 months?no 7. Any artificial heart valves, MVP, or defibrillator?no    MEDICATIONS & ALLERGIES:    Patient reports the following regarding taking any anticoagulation/antiplatelet therapy:   Plavix, Coumadin, Eliquis, Xarelto, Lovenox, Pradaxa, Brilinta, or Effient? no Aspirin? no  Patient confirms/reports the following medications:  Current Outpatient Medications  Medication Sig Dispense Refill  . atorvastatin (LIPITOR) 10 MG tablet Take 1 tablet (10 mg total) by mouth daily. 90 tablet 3  . Sod Picosulfate-Mag Ox-Cit Acd (CLENPIQ) 10-3.5-12 MG-GM -GM/160ML SOLN Take 1 kit by mouth once for 1 dose. 320 mL 0   No current facility-administered medications for this visit.    Patient confirms/reports the following allergies:  Allergies  Allergen Reactions  . Amoxicillin Hives and Rash    Has patient had a PCN reaction causing immediate rash, facial/tongue/throat swelling, SOB or lightheadedness with hypotension: no Has patient had a PCN reaction causing severe rash involving mucus membranes or skin necrosis:no Has patient had a PCN reaction that required hospitalization no Has patient had a PCN reaction occurring within the last 10 years: yes If all of the above answers are "NO", then may proceed with Cephalosporin use.    No orders of the defined types were placed in this encounter.   AUTHORIZATION INFORMATION Primary Insurance: 1D#: Group  #:  Secondary Insurance: 1D#: Group #:  SCHEDULE INFORMATION: Date: 01/25/20 Time: Location:[facility]

## 2019-12-21 ENCOUNTER — Ambulatory Visit: Payer: 59 | Admitting: Family Medicine

## 2020-01-19 ENCOUNTER — Other Ambulatory Visit: Payer: Self-pay

## 2020-01-19 DIAGNOSIS — Z86010 Personal history of colonic polyps: Secondary | ICD-10-CM

## 2020-01-19 MED ORDER — CLENPIQ 10-3.5-12 MG-GM -GM/160ML PO SOLN: 1.0000 | Freq: Once | ORAL | 0 refills | Status: AC

## 2020-01-22 ENCOUNTER — Other Ambulatory Visit
Admission: RE | Admit: 2020-01-22 | Discharge: 2020-01-22 | Disposition: A | Discharge status: Home / Self Care | Payer: 59 | Source: Ambulatory Visit | Attending: Gastroenterology | Admitting: Gastroenterology

## 2020-01-22 ENCOUNTER — Other Ambulatory Visit: Payer: Self-pay

## 2020-01-22 DIAGNOSIS — Z20822 Contact with and (suspected) exposure to covid-19: Secondary | ICD-10-CM | POA: Diagnosis not present

## 2020-01-22 DIAGNOSIS — Z01818 Encounter for other preprocedural examination: Secondary | ICD-10-CM | POA: Insufficient documentation

## 2020-01-23 LAB — SARS CORONAVIRUS 2 (TAT 6-24 HRS): SARS Coronavirus 2: NEGATIVE

## 2020-01-25 ENCOUNTER — Encounter
Admission: RE | Disposition: A | Discharge status: Home / Self Care | Payer: Self-pay | Source: Home / Self Care | Attending: Gastroenterology

## 2020-01-25 ENCOUNTER — Ambulatory Visit
Admission: RE | Admit: 2020-01-25 | Discharge: 2020-01-25 | Disposition: A | Discharge status: Home / Self Care | Payer: 59 | Attending: Gastroenterology | Admitting: Gastroenterology

## 2020-01-25 ENCOUNTER — Other Ambulatory Visit: Payer: Self-pay

## 2020-01-25 ENCOUNTER — Ambulatory Visit: Payer: 59 | Admitting: Anesthesiology

## 2020-01-25 DIAGNOSIS — Z801 Family history of malignant neoplasm of trachea, bronchus and lung: Secondary | ICD-10-CM | POA: Insufficient documentation

## 2020-01-25 DIAGNOSIS — Z86010 Personal history of colonic polyps: Secondary | ICD-10-CM | POA: Diagnosis not present

## 2020-01-25 DIAGNOSIS — Z809 Family history of malignant neoplasm, unspecified: Secondary | ICD-10-CM | POA: Diagnosis not present

## 2020-01-25 DIAGNOSIS — K5730 Diverticulosis of large intestine without perforation or abscess without bleeding: Secondary | ICD-10-CM | POA: Diagnosis not present

## 2020-01-25 DIAGNOSIS — Z87891 Personal history of nicotine dependence: Secondary | ICD-10-CM | POA: Diagnosis not present

## 2020-01-25 DIAGNOSIS — Z1211 Encounter for screening for malignant neoplasm of colon: Secondary | ICD-10-CM | POA: Insufficient documentation

## 2020-01-25 DIAGNOSIS — Z8249 Family history of ischemic heart disease and other diseases of the circulatory system: Secondary | ICD-10-CM | POA: Diagnosis not present

## 2020-01-25 DIAGNOSIS — Z79899 Other long term (current) drug therapy: Secondary | ICD-10-CM | POA: Diagnosis not present

## 2020-01-25 DIAGNOSIS — Z880 Allergy status to penicillin: Secondary | ICD-10-CM | POA: Insufficient documentation

## 2020-01-25 HISTORY — PX: COLONOSCOPY WITH PROPOFOL: SHX5780

## 2020-01-25 SURGERY — COLONOSCOPY WITH PROPOFOL
Anesthesia: General

## 2020-01-25 MED ORDER — SODIUM CHLORIDE 0.9 % IV SOLN
INTRAVENOUS | Status: DC
  Administered 2020-01-25: 20 mL/h via INTRAVENOUS

## 2020-01-25 MED ORDER — PROPOFOL 500 MG/50ML IV EMUL
INTRAVENOUS | Status: AC
  Filled 2020-01-25: qty 150

## 2020-01-25 MED ORDER — LIDOCAINE HCL (PF) 2 % IJ SOLN
INTRAMUSCULAR | Status: AC
  Filled 2020-01-25: qty 10

## 2020-01-25 MED ORDER — LIDOCAINE HCL (PF) 2 % IJ SOLN
INTRAMUSCULAR | Status: AC
  Filled 2020-01-25: qty 5

## 2020-01-25 MED ORDER — GLYCOPYRROLATE 0.2 MG/ML IJ SOLN
INTRAMUSCULAR | Status: AC
  Filled 2020-01-25: qty 1

## 2020-01-25 MED ORDER — PROPOFOL 500 MG/50ML IV EMUL
INTRAVENOUS | Status: DC | PRN
  Administered 2020-01-25: 140 ug/kg/min via INTRAVENOUS

## 2020-01-25 MED ORDER — PROPOFOL 10 MG/ML IV BOLUS
INTRAVENOUS | Status: DC | PRN
  Administered 2020-01-25: 70 mg via INTRAVENOUS

## 2020-01-25 NOTE — Op Note (Signed)
[facility] Gastroenterology Patient Name: Jeremiah Carpenter Procedure Date: 01/25/2020 8:25 AM MRN: [id number] Account #: [id number] Date of Birth: [birth year] Admit Type: Outpatient Age: 54 Room: [facility] ENDO ROOM 4 Gender: Male Note Status: Finalized Procedure:             Colonoscopy Indications:           Surveillance: Personal history of adenomatous polyps                         on last colonoscopy 5 years ago Providers:             Jonathon Bellows MD, MD Referring MD:          Jobe Marker. Einar Pheasant (Referring MD) Medicines:             Monitored Anesthesia Care Complications:         No immediate complications. Procedure:             Pre-Anesthesia Assessment:                        - Prior to the procedure, a History and Physical was                         performed, and patient medications, allergies and                         sensitivities were reviewed. The patient's tolerance                         of previous anesthesia was reviewed.                        - The risks and benefits of the procedure and the                         sedation options and risks were discussed with the                         patient. All questions were answered and informed                         consent was obtained.                        - ASA Grade Assessment: II - A patient with mild                         systemic disease.                        After obtaining informed consent, the colonoscope was                         passed under direct vision. Throughout the procedure,                         the patient's blood pressure, pulse, and oxygen                         saturations were monitored continuously. The  Colonoscope was introduced through the anus and                         advanced to the the cecum, identified by the                         appendiceal orifice. The colonoscopy was performed                         with ease. The patient  tolerated the procedure well.                         The quality of the bowel preparation was excellent. Findings:      The perianal and digital rectal examinations were normal.      Multiple small-mouthed diverticula were found in the entire colon. There       was no evidence of diverticular bleeding.      The exam was otherwise without abnormality on direct and retroflexion       views. Impression:            - Diverticulosis in the entire examined colon. There                         was no evidence of diverticular bleeding.                        - The examination was otherwise normal on direct and                         retroflexion views.                        - No specimens collected. Recommendation:        - Discharge patient to home (with escort).                        - Resume previous diet.                        - Continue present medications.                        - Repeat colonoscopy in 5 years for surveillance. Procedure Code(s):     --- Professional ---                        [phone], Colonoscopy, flexible; diagnostic, including                         collection of specimen(s) by brushing or washing, when                         performed (separate procedure) Diagnosis Code(s):     --- Professional ---                        Z86.010, Personal history of colonic polyps                        K57.30, Diverticulosis of large intestine without  perforation or abscess without bleeding CPT copyright 2019 American Medical Association. All rights reserved. The codes documented in this report are preliminary and upon coder review may  be revised to meet current compliance requirements. Jonathon Bellows, MD Jonathon Bellows MD, MD 01/25/2020 8:57:28 AM This report has been signed electronically. Number of Addenda: 0 Note Initiated On: 01/25/2020 8:25 AM Scope Withdrawal Time: 0 hours 12 minutes 31 seconds  Total Procedure Duration: 0 hours 14 minutes 52 seconds   Estimated Blood Loss:  Estimated blood loss: none.      [facility]

## 2020-01-25 NOTE — Anesthesia Postprocedure Evaluation (Signed)
Anesthesia Post Note  Patient: Jeremiah Carpenter  Procedure(s) Performed: COLONOSCOPY WITH PROPOFOL (N/A )  Patient location during evaluation: Endoscopy Anesthesia Type: General Level of consciousness: awake and alert Pain management: pain level controlled Vital Signs Assessment: post-procedure vital signs reviewed and stable Respiratory status: spontaneous breathing and respiratory function stable Cardiovascular status: stable Anesthetic complications: no   No complications documented.   Last Vitals:  Vitals:   01/25/20 0919 01/25/20 0929  BP: (!) 122/91 (!) 135/94  Pulse: 68 69  Resp: 16 17  Temp:    SpO2: 99% 97%    Last Pain:  Vitals:   01/25/20 0929  TempSrc:   PainSc: 0-No pain                 Kowen Kluth K

## 2020-01-25 NOTE — Anesthesia Preprocedure Evaluation (Addendum)
Anesthesia Evaluation  Patient identified by MRN, date of birth, ID band Patient awake    Reviewed: Allergy & Precautions, NPO status , Patient's Chart, lab work & pertinent test results  History of Anesthesia Complications Negative for: history of anesthetic complications  Airway Mallampati: II       Dental   Pulmonary neg sleep apnea, neg COPD, Not current smoker, former smoker,           Cardiovascular (-) hypertension(-) Past MI and (-) CHF (-) dysrhythmias (-) Valvular Problems/Murmurs     Neuro/Psych neg Seizures    GI/Hepatic Neg liver ROS, neg GERD  ,  Endo/Other  neg diabetes  Renal/GU negative Renal ROS     Musculoskeletal   Abdominal   Peds  Hematology   Anesthesia Other Findings   Reproductive/Obstetrics                            Anesthesia Physical Anesthesia Plan  ASA: I  Anesthesia Plan: General   Post-op Pain Management:    Induction: Intravenous  PONV Risk Score and Plan: 2 and Propofol infusion and TIVA  Airway Management Planned: Nasal Cannula  Additional Equipment:   Intra-op Plan:   Post-operative Plan:   Informed Consent: I have reviewed the patients History and Physical, chart, labs and discussed the procedure including the risks, benefits and alternatives for the proposed anesthesia with the patient or authorized representative who has indicated his/her understanding and acceptance.       Plan Discussed with:   Anesthesia Plan Comments:         Anesthesia Quick Evaluation

## 2020-01-25 NOTE — Transfer of Care (Signed)
Immediate Anesthesia Transfer of Care Note  Patient: Jeremiah Carpenter  Procedure(s) Performed: COLONOSCOPY WITH PROPOFOL (N/A )  Patient Location: PACU  Anesthesia Type:General  Level of Consciousness: awake, alert  and oriented  Airway & Oxygen Therapy: Patient Spontanous Breathing  Post-op Assessment: Report given to RN and Post -op Vital signs reviewed and stable  Post vital signs: Reviewed and stable  Last Vitals:  Vitals Value Taken Time  BP 98/70 01/25/20 0859  Temp 36 C 01/25/20 0859  Pulse 81 01/25/20 0859  Resp 13 01/25/20 0859  SpO2 95 % 01/25/20 0859  Vitals shown include unvalidated device data.  Last Pain:  Vitals:   01/25/20 0859  TempSrc:   PainSc: 0-No pain         Complications: No complications documented.

## 2020-01-25 NOTE — H&P (Signed)
Jonathon Bellows, MD [address] [place], [place], [place], [id number], [place], [place], [place], [address] Phone: [phone]  Fax: [phone]  Primary Care Physician:  Lesleigh Noe, MD   Pre-Procedure History & Physical: HPI:  Jeremiah Carpenter is a 54 y.o. male is here for an colonoscopy.   Past Medical History:  Diagnosis Date  . History of colon polyps    benign  . History of kidney stones   . History of migraine    last one about a month ago  . Hyperlipidemia    takes Atorvastatin daily  . Joint pain   . Weakness    numbness mainly left hand and rarely on right    Past Surgical History:  Procedure Laterality Date  . ANTERIOR CERVICAL DECOMP/DISCECTOMY FUSION N/A 01/11/2016   Procedure: C5-6, C6-7 Anterior Cervical Discectomy and Fusion, Allograft, Plate;  Surgeon: Marybelle Killings, MD;  Location: [facility];  Service: Orthopedics;  Laterality: N/A;  . CARPAL TUNNEL RELEASE Bilateral   . COLONOSCOPY    . VASECTOMY      Prior to Admission medications   Medication Sig Start Date End Date Taking? Authorizing Provider  atorvastatin (LIPITOR) 10 MG tablet Take 1 tablet (10 mg total) by mouth daily. 12/03/19  Yes Lesleigh Noe, MD    Allergies as of 12/18/2019 - Review Complete 12/17/2019  Allergen Reaction Noted  . Amoxicillin Hives and Rash 03/26/2013    Family History  Problem Relation Age of Onset  . Lung cancer Mother        lung  . Hypertension Father   . Hyperlipidemia Father   . Heart attack Father 44       bypass surgery  . Cancer Half-Brother        unsure what type  . Colon cancer Neg Hx     Social History   Socioeconomic History  . Marital status: Married    Spouse name: [name]  . Number of children: 3  . Years of education: some college  . Highest education level: Not on file  Occupational History  . Not on file  Tobacco Use  . Smoking status: Former Research scientist (life sciences)  . Smokeless tobacco: Never Used  . Tobacco comment: quit smoking  15+ yrs ago  Vaping Use  . Vaping Use: Never used  Substance and Sexual Activity  . Alcohol use: Yes    Alcohol/week: 0.0 standard drinks    Comment: 2-3 times a week  . Drug use: Not Currently    Comment: CBD - pain  . Sexual activity: Yes    Birth control/protection: Post-menopausal, Surgical  Other Topics Concern  . Not on file  Social History Narrative   12/03/19   From: [place], moved in 1994 to [place]   Living: with wife, [name] (1996)   Work: Adult nurse      Family: 3 adults daughters - [place], [place], Carrolyn Meiers       Enjoys: traveling, kayaking, fishing      Exercise: walking a lot - up and down ladders   Diet: pretty good      Safety   Seat belts: Yes    Guns: Yes  and secure   Safe in relationships: Yes    Social Determinants of Radio broadcast assistant Strain:   . Difficulty of Paying Living Expenses: Not on file  Food Insecurity:   . Worried About Charity fundraiser in the Last Year: Not on file  . Ran Out of  Food in the Last Year: Not on file  Transportation Needs:   . Lack of Transportation (Medical): Not on file  . Lack of Transportation (Non-Medical): Not on file  Physical Activity:   . Days of Exercise per Week: Not on file  . Minutes of Exercise per Session: Not on file  Stress:   . Feeling of Stress : Not on file  Social Connections:   . Frequency of Communication with Friends and Family: Not on file  . Frequency of Social Gatherings with Friends and Family: Not on file  . Attends Religious Services: Not on file  . Active Member of Clubs or Organizations: Not on file  . Attends Archivist Meetings: Not on file  . Marital Status: Not on file  Intimate Partner Violence:   . Fear of Current or Ex-Partner: Not on file  . Emotionally Abused: Not on file  . Physically Abused: Not on file  . Sexually Abused: Not on file    Review of Systems: See HPI, otherwise negative ROS  Physical Exam: BP (!) 147/97   Pulse 76   Temp (!)  96.4 F (35.8 C) (Temporal)   Resp 20   Ht 5\' 6"  (1.676 m)   Wt 76.2 kg   SpO2 100%   BMI 27.12 kg/m  General:   Alert,  pleasant and cooperative in NAD Head:  Normocephalic and atraumatic. Neck:  Supple; no masses or thyromegaly. Lungs:  Clear throughout to auscultation, normal respiratory effort.    Heart:  +S1, +S2, Regular rate and rhythm, No edema. Abdomen:  Soft, nontender and nondistended. Normal bowel sounds, without guarding, and without rebound.   Neurologic:  Alert and  oriented x4;  grossly normal neurologically.  Impression/Plan: Jeremiah Carpenter is here for an colonoscopy to be performed for surveillance due to prior history of colon polyps   Risks, benefits, limitations, and alternatives regarding  colonoscopy have been reviewed with the patient.  Questions have been answered.  All parties agreeable.   Jonathon Bellows, MD  01/25/2020, 8:26 AM

## 2020-01-26 ENCOUNTER — Encounter: Payer: Self-pay | Admitting: Gastroenterology

## 2020-04-17 ENCOUNTER — Encounter: Payer: Self-pay | Admitting: Family Medicine

## 2020-04-18 NOTE — Telephone Encounter (Signed)
LVM. Please forward call to this nurse.

## 2020-04-21 NOTE — Telephone Encounter (Signed)
Contacted pt who reports he does not have a yellow fever card, he has a yellow card to record vaccines for international travel. He reports the one he has has been filled out by [facility] where he received the covid vaccines but it appears to him to be haphazard and he is not sure if it will be adequate for [place]. He said he and his wife will be going for a 3 day weekend and if vaccination status is not correct they may be asked to quarantine for 5 days and they do not have time to do that. He is asking that PCP take the info from the [facility] card and transfer it to a new yellow card with her signature. He is not sure if [facility] will be willing to do this in a professional manner. He is going to contact [facility] where he received the vaccines today and inquire what they will do. Advised this nurse will inquire if PCP is willing to transfer and sign this information to a new card and this office will f/u with him. Pt verbalized understanding and was very appreciative.

## 2020-04-25 NOTE — Telephone Encounter (Signed)
Ok to consolidate the information if [facility] is unable to provide support

## 2020-04-25 NOTE — Telephone Encounter (Signed)
Advised pt provider willing to sign for vaccinations and this nruse will consolidate info.  Pt will try to bring all necessary items by tomorrow. He did talk to [facility] but they were not willing to help him at all.  Advised to tell front office that he is dropping the info off for the nurse. Pt verbalized understanding. Pt is going out of country on 05/12/20

## 2020-04-28 NOTE — Telephone Encounter (Signed)
Patient came into office and dropped off information. Placed in sealed envelope and placed in script tower for [place]. Please advise.

## 2020-04-29 NOTE — Telephone Encounter (Signed)
Pt called in he is going to drop off information today.

## 2020-05-02 NOTE — Telephone Encounter (Signed)
Card has been completed and signed by Dr. Einar Pheasant.  Contacted pt and advised card is ready to be picked up. Advised also that his wife's card is here also and that Dr. Einar Pheasant cannot fill that out since she is not her pt. He verbalized understanding and was very appreciative for Dr. Einar Pheasant taking care of this.   He said he knows Dr. Einar Pheasant is not taking new pts right now. But he is requesting that Dr. Einar Pheasant consider taking his wife as a new pt. He said his wife was also a Dr. Marjory Lies pt and that she has just procrastinated on scheduling a NP apt. Talked to pt's wife and she reported it is ok to review her chart. Her name is Jacobo Moncrief, DOB 8/25/[birth year]. She is appreciative of Dr. Einar Pheasant considering her to be a pt. 9 Advised a msg would be sent to Dr. Einar Pheasant and this office would f/u. Pt appreciative.

## 2020-05-03 NOTE — Telephone Encounter (Signed)
Called patient and informed him. Will have wife call to set up visit.

## 2020-05-03 NOTE — Telephone Encounter (Signed)
Ok to accept pt's wife for TOC. Routing to scheduler

## 2020-07-11 ENCOUNTER — Other Ambulatory Visit: Payer: Self-pay

## 2020-07-11 ENCOUNTER — Ambulatory Visit: Payer: BC Managed Care – PPO | Admitting: Family Medicine

## 2020-07-11 ENCOUNTER — Encounter: Payer: Self-pay | Admitting: Family Medicine

## 2020-07-11 ENCOUNTER — Other Ambulatory Visit: Payer: Self-pay | Admitting: Family Medicine

## 2020-07-11 VITALS — BP 104/76 | HR 70 | Temp 98.5°F | Resp 14 | Ht 65.0 in | Wt 177.5 lb

## 2020-07-11 DIAGNOSIS — E7800 Pure hypercholesterolemia, unspecified: Secondary | ICD-10-CM | POA: Diagnosis not present

## 2020-07-11 DIAGNOSIS — M4802 Spinal stenosis, cervical region: Secondary | ICD-10-CM

## 2020-07-11 DIAGNOSIS — G44209 Tension-type headache, unspecified, not intractable: Secondary | ICD-10-CM | POA: Diagnosis not present

## 2020-07-11 DIAGNOSIS — E782 Mixed hyperlipidemia: Secondary | ICD-10-CM

## 2020-07-11 LAB — LDL CHOLESTEROL, DIRECT: Direct LDL: 109 mg/dL

## 2020-07-11 LAB — LIPID PANEL
Cholesterol: 200 mg/dL (ref 0–200)
HDL: 43.4 mg/dL (ref >39.00)
NonHDL: 156.62
Total CHOL/HDL Ratio: 5
Triglycerides: 314 mg/dL — ABNORMAL HIGH (ref 0.0–149.0)
VLDL: 62.8 mg/dL — ABNORMAL HIGH (ref 0.0–40.0)

## 2020-07-11 LAB — COMPREHENSIVE METABOLIC PANEL WITH GFR
ALT: 20 U/L (ref 0–53)
AST: 19 U/L (ref 0–37)
Albumin: 4.5 g/dL (ref 3.5–5.2)
Alkaline Phosphatase: 43 U/L (ref 39–117)
BUN: 21 mg/dL (ref 6–23)
CO2: 27 meq/L (ref 19–32)
Calcium: 9.5 mg/dL (ref 8.4–10.5)
Chloride: 104 meq/L (ref 96–112)
Creatinine, Ser: 1.02 mg/dL (ref 0.40–1.50)
GFR: 82.06 mL/min
Glucose, Bld: 88 mg/dL (ref 70–99)
Potassium: 4.2 meq/L (ref 3.5–5.1)
Sodium: 140 meq/L (ref 135–145)
Total Bilirubin: 0.6 mg/dL (ref 0.2–1.2)
Total Protein: 7.3 g/dL (ref 6.0–8.3)

## 2020-07-11 MED ORDER — PREDNISONE 20 MG PO TABS: ORAL_TABLET | ORAL | 0 refills | Status: AC

## 2020-07-11 MED ORDER — ATORVASTATIN CALCIUM 20 MG PO TABS: 20.0000 mg | ORAL_TABLET | Freq: Every day | ORAL | 3 refills | Status: DC

## 2020-07-11 NOTE — Progress Notes (Signed)
Subjective:     Jeremiah Carpenter is a 55 y.o. male presenting for Hyperlipidemia (would like to check levels, prescription maybe old) and Headache (Off and on. Hx of neck surgery. Possible steroid to help the inflammation. Takes Ibuprofen.)     HPI  #HLD - ran out of lipitor - but found some expired pills  - wants to check his levels  #Headache - had surgery 3-4 years ago and had fusion - was told he has stenosis in the vertbrea  - getting acupuncture/chiropractor work weekly - last week had some bad HA coming from the neck tension - chiropractor recommended consider steroid course - last week was taking 800 mg every 4-5 years and alternating with tylenol - does get radiating down the arm symptoms - has some numbness in the hand - hx of carpal tunnel surgery - improvement in tingling in hand when he raises his arm above his head   Review of Systems   Social History   Tobacco Use  Smoking Status Former Smoker  Smokeless Tobacco Never Used  Tobacco Comment   quit smoking 15+ yrs ago        Objective:    BP Readings from Last 3 Encounters:  07/11/20 104/76  01/25/20 (!) 135/94  12/03/19 120/70   Wt Readings from Last 3 Encounters:  07/11/20 177 lb 8 oz (80.5 kg)  01/25/20 168 lb (76.2 kg)  12/03/19 176 lb 8 oz (80.1 kg)    BP 104/76   Pulse 70   Temp 98.5 F (36.9 C)   Resp 14   Ht 5\' 5"  (1.651 m)   Wt 177 lb 8 oz (80.5 kg)   BMI 29.54 kg/m    Physical Exam Constitutional:      Appearance: Normal appearance. He is not ill-appearing or diaphoretic.  HENT:     Right Ear: External ear normal.     Left Ear: External ear normal.     Nose: Nose normal.  Eyes:     General: No scleral icterus.    Extraocular Movements: Extraocular movements intact.     Conjunctiva/sclera: Conjunctivae normal.  Neck:     Comments: Negative spurling. Cardiovascular:     Rate and Rhythm: Normal rate and regular rhythm.     Heart sounds: No murmur  heard.   Pulmonary:     Effort: Pulmonary effort is normal. No respiratory distress.     Breath sounds: Normal breath sounds. No wheezing.  Musculoskeletal:     Cervical back: Full passive range of motion without pain and neck supple. No spinous process tenderness or muscular tenderness.     Comments: Negative tinel and phallen on the left wrist. Negative tinel on the left elbow  Skin:    General: Skin is warm and dry.  Neurological:     Mental Status: He is alert. Mental status is at baseline.  Psychiatric:        Mood and Affect: Mood normal.           Assessment & Plan:   Problem List Items Addressed This Visit      Other   HYPERCHOLESTEROLEMIA    Lab Results  Component Value Date   LDLCALC 134 (H) 04/21/2018   Will reassess cholesterol on atorvastatin 10 mg. If not <100 will increase dose.       Relevant Orders   Comprehensive metabolic panel   Lipid panel   Cervical stenosis of spine - Primary    S/p spine surgery and following with  chiropractor and acupuncture with worsening tension HA. Unable to reproduce neurological symptoms but given persistence and hx of surgery reasonable to consider course of steroids to calm inflammation. Advised f/u with surgeon if symptoms persisting after steroids      Relevant Medications   predniSONE (DELTASONE) 20 MG tablet   Tension headache   Relevant Medications   predniSONE (DELTASONE) 20 MG tablet       Return if symptoms worsen or fail to improve.  Lesleigh Noe, MD  This visit occurred during the SARS-CoV-2 public health emergency.  Safety protocols were in place, including screening questions prior to the visit, additional usage of staff PPE, and extensive cleaning of exam room while observing appropriate contact time as indicated for disinfecting solutions.

## 2020-07-11 NOTE — Assessment & Plan Note (Signed)
Lab Results  Component Value Date   LDLCALC 134 (H) 04/21/2018   Will reassess cholesterol on atorvastatin 10 mg. If not <100 will increase dose.

## 2020-07-11 NOTE — Assessment & Plan Note (Signed)
S/p spine surgery and following with chiropractor and acupuncture with worsening tension HA. Unable to reproduce neurological symptoms but given persistence and hx of surgery reasonable to consider course of steroids to calm inflammation. Advised f/u with surgeon if symptoms persisting after steroids

## 2020-07-11 NOTE — Patient Instructions (Addendum)
Steroids for neck and headache symptoms  Continue with physical therapy exercises   If no improvement in 4-6 weeks consider reaching out to Dr. Lorin Mercy office

## 2020-11-14 ENCOUNTER — Other Ambulatory Visit: Payer: Self-pay

## 2020-11-14 ENCOUNTER — Encounter: Payer: Self-pay | Admitting: Family Medicine

## 2020-11-14 ENCOUNTER — Ambulatory Visit: Payer: BLUE CROSS/BLUE SHIELD | Admitting: Family Medicine

## 2020-11-14 VITALS — BP 112/70 | HR 105 | Temp 98.1°F | Ht 65.0 in | Wt 183.0 lb

## 2020-11-14 DIAGNOSIS — N50811 Right testicular pain: Secondary | ICD-10-CM

## 2020-11-14 DIAGNOSIS — N50812 Left testicular pain: Secondary | ICD-10-CM | POA: Insufficient documentation

## 2020-11-14 DIAGNOSIS — R350 Frequency of micturition: Secondary | ICD-10-CM

## 2020-11-14 LAB — POC URINALSYSI DIPSTICK (AUTOMATED)
Bilirubin, UA: NEGATIVE
Glucose, UA: NEGATIVE
Ketones, UA: NEGATIVE
Leukocytes, UA: NEGATIVE
Nitrite, UA: NEGATIVE
Protein, UA: NEGATIVE
Spec Grav, UA: 1.02 (ref 1.010–1.025)
Urobilinogen, UA: 0.2 E.U./dL
pH, UA: 6 (ref 5.0–8.0)

## 2020-11-14 NOTE — Progress Notes (Signed)
Subjective:     Jeremiah Carpenter is a 55 y.o. male presenting for Testiclular Discomfort (Started about a month ago. Sits a lot at job. No dysuria. Urinary frequency with more water intake. Brother passed 2 years ago with Testicular Cancer.)     HPI  #testicular discomfort - recently started traveling and changed jobs - 1 month of symptoms - has gained some weight - is going to the bathroom more frequently - no swellings, bumps or lesions on the testicles - right is more uncomfortable than the left  - no issues starting or emptying the bladder - maybe once in a while but not frequently  Sex last night, no new partners - not worse with sex  1/2 brother died from testicular cancer   Review of Systems  Constitutional:  Negative for chills and fever.  Gastrointestinal:  Negative for abdominal pain, constipation, diarrhea, nausea and vomiting.  Endocrine: Negative for polydipsia.  Genitourinary:  Positive for frequency and testicular pain. Negative for dysuria, penile discharge, penile pain and urgency.    Social History   Tobacco Use  Smoking Status Former  Smokeless Tobacco Never  Tobacco Comments   quit smoking 15+ yrs ago        Objective:    BP Readings from Last 3 Encounters:  11/14/20 112/70  07/11/20 104/76  01/25/20 (!) 135/94   Wt Readings from Last 3 Encounters:  11/14/20 183 lb (83 kg)  07/11/20 177 lb 8 oz (80.5 kg)  01/25/20 168 lb (76.2 kg)    BP 112/70 (BP Location: Right Arm, Patient Position: Sitting, Cuff Size: Large)   Pulse (!) 105   Temp 98.1 F (36.7 C)   Ht '5\' 5"'$  (1.651 m)   Wt 183 lb (83 kg)   SpO2 96%   BMI 30.45 kg/m    Physical Exam Exam conducted with a chaperone present.  Constitutional:      Appearance: Normal appearance. He is not ill-appearing or diaphoretic.  HENT:     Right Ear: External ear normal.     Left Ear: External ear normal.  Eyes:     General: No scleral icterus.    Extraocular Movements:  Extraocular movements intact.     Conjunctiva/sclera: Conjunctivae normal.  Cardiovascular:     Rate and Rhythm: Normal rate and regular rhythm.     Heart sounds: No murmur heard. Pulmonary:     Effort: Pulmonary effort is normal. No respiratory distress.     Breath sounds: Normal breath sounds. No wheezing.  Abdominal:     Hernia: There is no hernia in the left inguinal area or right inguinal area.  Genitourinary:    Penis: Normal and circumcised.      Testes: Normal. Cremasteric reflex is present.        Right: Mass, tenderness or swelling not present.        Left: Mass, tenderness or swelling not present.     Epididymis:     Right: Normal.     Left: Normal.     Prostate: Not enlarged and not tender.  Musculoskeletal:     Cervical back: Neck supple.  Skin:    General: Skin is warm and dry.  Neurological:     Mental Status: He is alert. Mental status is at baseline.  Psychiatric:        Mood and Affect: Mood normal.     UA: neg LE, nitrites     Assessment & Plan:   Problem List Items Addressed This Visit  Other   Pain in both testicles - Primary    UA w/o sign of infection and prostate exam normal. Will get testicular US. Advised tylenol/ibuprofen and monitoring.       Relevant Orders   US SCROTUM W/DOPPLER   POCT Urinalysis Dipstick (Automated) (Completed)   Other Visit Diagnoses     Urinary frequency       Relevant Orders   POCT Urinalysis Dipstick (Automated) (Completed)        Return if symptoms worsen or fail to improve.  Lesleigh Noe, MD  This visit occurred during the SARS-CoV-2 public health emergency.  Safety protocols were in place, including screening questions prior to the visit, additional usage of staff PPE, and extensive cleaning of exam room while observing appropriate contact time as indicated for disinfecting solutions.

## 2020-11-14 NOTE — Assessment & Plan Note (Signed)
UA w/o sign of infection and prostate exam normal. Will get testicular US. Advised tylenol/ibuprofen and monitoring.

## 2020-11-14 NOTE — Patient Instructions (Addendum)
Urine was normal  No sign of infection   You will get a phone call about the ultrasound.    Call if worsening pain, fever, chills.   Ibuprofen or tylenol as needed for pain

## 2021-01-03 ENCOUNTER — Encounter: Payer: Self-pay | Admitting: Family Medicine

## 2021-01-03 DIAGNOSIS — G8929 Other chronic pain: Secondary | ICD-10-CM

## 2021-01-03 DIAGNOSIS — M4802 Spinal stenosis, cervical region: Secondary | ICD-10-CM

## 2021-02-09 ENCOUNTER — Encounter: Payer: Self-pay | Admitting: Family Medicine

## 2021-03-07 ENCOUNTER — Encounter: Payer: Self-pay | Admitting: Student in an Organized Health Care Education/Training Program

## 2021-03-07 ENCOUNTER — Ambulatory Visit: Payer: BLUE CROSS/BLUE SHIELD | Admitting: Student in an Organized Health Care Education/Training Program

## 2021-03-07 ENCOUNTER — Other Ambulatory Visit: Payer: Self-pay

## 2021-03-07 ENCOUNTER — Ambulatory Visit
Admission: RE | Admit: 2021-03-07 | Discharge: 2021-03-07 | Disposition: A | Discharge status: Home / Self Care | Payer: BLUE CROSS/BLUE SHIELD | Source: Ambulatory Visit | Attending: Student in an Organized Health Care Education/Training Program | Admitting: Student in an Organized Health Care Education/Training Program

## 2021-03-07 ENCOUNTER — Ambulatory Visit
Admission: RE | Admit: 2021-03-07 | Discharge: 2021-03-07 | Disposition: A | Discharge status: Home / Self Care | Payer: BLUE CROSS/BLUE SHIELD | Attending: Student in an Organized Health Care Education/Training Program | Admitting: Student in an Organized Health Care Education/Training Program

## 2021-03-07 VITALS — BP 147/98 | HR 68 | Temp 97.0°F | Resp 16 | Ht 65.0 in | Wt 178.0 lb

## 2021-03-07 DIAGNOSIS — M542 Cervicalgia: Secondary | ICD-10-CM

## 2021-03-07 DIAGNOSIS — M25511 Pain in right shoulder: Secondary | ICD-10-CM | POA: Diagnosis not present

## 2021-03-07 DIAGNOSIS — Z981 Arthrodesis status: Secondary | ICD-10-CM | POA: Insufficient documentation

## 2021-03-07 DIAGNOSIS — M4312 Spondylolisthesis, cervical region: Secondary | ICD-10-CM | POA: Diagnosis not present

## 2021-03-07 DIAGNOSIS — M5412 Radiculopathy, cervical region: Secondary | ICD-10-CM

## 2021-03-07 DIAGNOSIS — G894 Chronic pain syndrome: Secondary | ICD-10-CM | POA: Insufficient documentation

## 2021-03-07 DIAGNOSIS — M25512 Pain in left shoulder: Secondary | ICD-10-CM | POA: Diagnosis not present

## 2021-03-07 MED ORDER — GABAPENTIN 100 MG PO CAPS: 100.0000 mg | ORAL_CAPSULE | Freq: Every day | ORAL | 0 refills | Status: DC

## 2021-03-07 NOTE — Progress Notes (Signed)
Safety precautions to be maintained throughout the outpatient stay will include: orient to surroundings, keep bed in low position, maintain call bell within reach at all times, provide assistance with transfer out of bed and ambulation.  

## 2021-03-07 NOTE — Progress Notes (Signed)
Patient: Jeremiah Carpenter  Service Category: E/M  Provider: Gillis Santa, MD  DOB: [birth year]  DOS: 03/07/2021  Referring Provider: Lesleigh Noe, MD  MRN: [id number]  Setting: Ambulatory outpatient  PCP: Lesleigh Noe, MD  Type: New Patient  Specialty: Interventional Pain Management    Location: Office  Delivery: Face-to-face     Primary Reason(s) for Visit: Encounter for initial evaluation of one or more chronic problems (new to examiner) potentially causing chronic pain, and posing a threat to normal musculoskeletal function. (Level of risk: High) CC: Neck Pain (lower)  HPI  [name] is a 56 y.o. year old, male patient, who comes for the first time to our practice referred by Lesleigh Noe, MD for our initial evaluation of his chronic pain. He has HYPERCHOLESTEROLEMIA; PES PLANUS; Fatigue; Low testosterone; Hx of adenomatous polyp of colon; Cervical stenosis of spine; S/P cervical spinal fusion; Seborrheic keratoses; Tension headache; Pain in both testicles; Cervical radicular pain; Cervicalgia; and Chronic pain syndrome on their problem list. Today he comes in for evaluation of his Neck Pain (lower)  Pain Assessment: Location: Right, Left Neck Radiating: pain radiaties down to his shoulder Onset: More than a month ago Duration: Chronic pain Quality: Constant, Nagging, Throbbing Severity: 5 /10 (subjective, self-reported pain score)  Effect on ADL: limits my daily activities Timing: Constant Modifying factors: ice, moving around, chiroprator BP: (!) 147/98   HR: 68  Onset and Duration: Gradual Cause of pain: Surgery Severity: Getting worse, NAS-11 at its worse: 9/10, NAS-11 at its best: 3/10, NAS-11 now: 5/10, and NAS-11 on the average: 5/10 Timing: After activity or exercise Aggravating Factors: Prolonged sitting, Twisting, and wearing a back pack for prolonged period of time , turning my neck to the right Alleviating Factors: Acupuncture, Cold packs, TENS, and Chiropractic  manipulations Associated Problems: Numbness and Pain that wakes patient up Quality of Pain: Aching, Deep, Nagging, Throbbing, and Uncomfortable Previous Examinations or Tests: MRI scan, X-rays, Orthopedic evaluation, and Chiropractic evaluation Previous Treatments: Chiropractic manipulations and TENS  Jeremiah Carpenter is a pleasant 56 year old male with a history of ACDF, two-level, C5-C6 and C6-C7 performed in 2017 who presents with a chief complaint of neck pain with radiation into right shoulder.  He also has left hand pain as well.  He has done chiropractic therapy as well as TENS unit.  He is utilizing CBD oil which she finds helpful.  He has not done physical therapy in the last 2 years.  His pain is worse with lateral rotation.  Has had epidural steroid injection in his cervical spine on 1 occasion prior to his ACDF in 2017 which he states was not helpful and very uncomfortable.  He has not tried gabapentin or Lyrica in the past.  Does not want anything that we will result in sedation.  Meds   Current Outpatient Medications:    atorvastatin (LIPITOR) 20 MG tablet, Take 1 tablet (20 mg total) by mouth daily., Disp: 90 tablet, Rfl: 3   gabapentin (NEURONTIN) 100 MG capsule, Take 1-3 capsules (100-300 mg total) by mouth at bedtime. Follow written titration schedule., Disp: 90 capsule, Rfl: 0  Imaging Review  Cervical Imaging: Cervical MR wo contrast: Results for orders placed during the hospital encounter of 09/21/15  MR Cervical Spine Wo Contrast  Narrative CLINICAL DATA:  Neck pain and stiffness.  Left arm numbness. EXAM: MRI CERVICAL SPINE WITHOUT CONTRAST TECHNIQUE: Multiplanar, multisequence MR imaging of the cervical spine was performed. No intravenous contrast was administered. COMPARISON:  Cervical radiographs  07/27/2015. Cervical MRI 11/17/2009 FINDINGS: Alignment: Straightening of the cervical lordosis with mild kyphosis. Mild retrolisthesis C6-7 slightly progressive from the prior  MRI Vertebrae: Discogenic bone marrow changes are present at C5-6 and C6-7. No fracture or mass. Cord: Normal cord signal.  No cord compression or cord lesion. Posterior Fossa, vertebral arteries, paraspinal tissues: Negative Disc levels: C2-3: Small uncinate spur on the left unchanged without spinal stenosis. C3-4: Mild disc degeneration with mild uncinate spurring. No significant spinal stenosis C4-5: Small central disc protrusion has progressed. This indents the cord and causes mild spinal stenosis. Mild associated diffuse uncinate spurring. C5-6: Moderate disc degeneration and spondylosis with diffuse uncinate spurring. Moderate spinal stenosis and moderate foraminal stenosis bilaterally unchanged. C6-7: Moderate disc degeneration with disc space narrowing and diffuse uncinate spurring. Mild spinal stenosis. Moderate left foraminal stenosis. Left lateral disc/ osteophyte complex similar to the prior study and could be causing impingement of the left C7 nerve root. Moderate right foraminal encroachment also noted due to spurring C7-T1:  Negative IMPRESSION: Small central disc protrusion at C4-5 with mild spinal stenosis. Disc degeneration and spondylosis C5-6 unchanged causing moderate spinal stenosis and moderate foraminal stenosis bilaterally Moderate disc degeneration and spondylosis at C6-7 is unchanged causing mild spinal stenosis. Left lateral disc/ osteophyte complex at C6-7 similar to the prior study with expected impingement of the left C7 nerve root. Electronically Signed By: Franchot Gallo M.D. On: 09/21/2015 08:03  Narrative CLINICAL DATA:  C5 through C7 ACDF.  EXAM: CERVICAL SPINE - 2-3 VIEW; DG C-ARM 61-120 MIN  COMPARISON:  None.  FINDINGS: Three fluoroscopic spot images of the cervical spine in frontal and lateral projections demonstrate anterior plate and screw fusion C5 through C7. Alignment is maintained. Total fluoroscopy time  21 seconds.  IMPRESSION: Intraoperative fluoroscopy post C5 through C7 ACDF.   Electronically Signed By: Jeb Levering M.D. On: 01/11/2016 15:25   Narrative CLINICAL DATA:  Left upper extremity tingling.  EXAM: CERVICAL SPINE - COMPLETE 4+ VIEW  COMPARISON:  MRI of November 17, 2009.  FINDINGS: Moderate degenerative disc disease is noted at C5-6 and C6-7 with anterior osteophyte formation. No fracture or spondylolisthesis is noted. Mild bilateral neural foraminal stenosis is noted at C5-6 and C6-7 secondary to uncovertebral spurring.  IMPRESSION: Moderate degenerative disc disease is noted at C5-6 and C6-7 with mild bilateral neural foraminal stenosis at these levels secondary to uncovertebral spurring.   Electronically Signed By: Marijo Conception, M.D. On: 07/27/2015 14:27   Complexity Note: Imaging results reviewed. Results shared with [name], using Layman's terms.                         ROS  Cardiovascular: High blood pressure Pulmonary or Respiratory: No reported pulmonary signs or symptoms such as wheezing and difficulty taking a deep full breath (Asthma), difficulty blowing air out (Emphysema), coughing up mucus (Bronchitis), persistent dry cough, or temporary stoppage of breathing during sleep Neurological: No reported neurological signs or symptoms such as seizures, abnormal skin sensations, urinary and/or fecal incontinence, being born with an abnormal open spine and/or a tethered spinal cord Psychological-Psychiatric: No reported psychological or psychiatric signs or symptoms such as difficulty sleeping, anxiety, depression, delusions or hallucinations (schizophrenial), mood swings (bipolar disorders) or suicidal ideations or attempts Gastrointestinal: No reported gastrointestinal signs or symptoms such as vomiting or evacuating blood, reflux, heartburn, alternating episodes of diarrhea and constipation, inflamed or scarred liver, or pancreas or  irrregular and/or infrequent bowel movements Genitourinary: No  reported renal or genitourinary signs or symptoms such as difficulty voiding or producing urine, peeing blood, non-functioning kidney, kidney stones, difficulty emptying the bladder, difficulty controlling the flow of urine, or chronic kidney disease Hematological: No reported hematological signs or symptoms such as prolonged bleeding, low or poor functioning platelets, bruising or bleeding easily, hereditary bleeding problems, low energy levels due to low hemoglobin or being anemic Endocrine: No reported endocrine signs or symptoms such as high or low blood sugar, rapid heart rate due to high thyroid levels, obesity or weight gain due to slow thyroid or thyroid disease Rheumatologic: No reported rheumatological signs and symptoms such as fatigue, joint pain, tenderness, swelling, redness, heat, stiffness, decreased range of motion, with or without associated rash Musculoskeletal: Negative for myasthenia gravis, muscular dystrophy, multiple sclerosis or malignant hyperthermia Work History: Working full time  Allergies  [name] is allergic to amoxicillin.  Laboratory Chemistry Profile   Renal Lab Results  Component Value Date   BUN 21 07/11/2020   CREATININE 1.02 07/11/2020   GFR 82.06 07/11/2020   GFRAA >60 01/04/2016   GFRNONAA >60 01/04/2016   SPECGRAV 1.020 11/14/2020   PHUR 6.0 11/14/2020   PROTEINUR Negative 11/14/2020     Electrolytes Lab Results  Component Value Date   NA 140 07/11/2020   K 4.2 07/11/2020   CL 104 07/11/2020   CALCIUM 9.5 07/11/2020     Hepatic Lab Results  Component Value Date   AST 19 07/11/2020   ALT 20 07/11/2020   ALBUMIN 4.5 07/11/2020   ALKPHOS 43 07/11/2020     ID Lab Results  Component Value Date   SARSCOV2NAA NEGATIVE 01/22/2020   STAPHAUREUS NEGATIVE 01/04/2016   MRSAPCR NEGATIVE 01/04/2016     Bone Lab Results  Component Value Date   TESTOFREE 50.9 10/22/2018    TESTOSTERONE 334 10/22/2018     Endocrine Lab Results  Component Value Date   GLUCOSE 88 07/11/2020   GLUCOSEU NEGATIVE 01/16/2017   HGBA1C 5.4 09/03/2011   TSH 1.44 09/03/2011   TESTOFREE 50.9 10/22/2018   TESTOSTERONE 334 10/22/2018   SHBG 27 10/22/2018     Neuropathy Lab Results  Component Value Date   HGBA1C 5.4 09/03/2011     CNS No results found for: COLORCSF, APPEARCSF, RBCCOUNTCSF, WBCCSF, POLYSCSF, LYMPHSCSF, EOSCSF, PROTEINCSF, GLUCCSF, JCVIRUS, CSFOLI, IGGCSF, LABACHR, ACETBL, LABACHR, ACETBL   Inflammation (CRP: Acute   ESR: Chronic) No results found for: CRP, ESRSEDRATE, LATICACIDVEN   Rheumatology No results found for: RF, ANA, LABURIC, URICUR, LYMEIGGIGMAB, LYMEABIGMQN, HLAB27   Coagulation Lab Results  Component Value Date   INR 1.00 01/04/2016   LABPROT 13.2 01/04/2016   APTT 26 01/04/2016   PLT 276.0 10/22/2018     Cardiovascular Lab Results  Component Value Date   HGB 15.3 10/22/2018   HCT 45.1 10/22/2018     Screening Lab Results  Component Value Date   SARSCOV2NAA NEGATIVE 01/22/2020   STAPHAUREUS NEGATIVE 01/04/2016   MRSAPCR NEGATIVE 01/04/2016     Cancer No results found for: CEA, CA125, LABCA2   Allergens No results found for: ALMOND, APPLE, ASPARAGUS, AVOCADO, BANANA, BARLEY, BASIL, BAYLEAF, GREENBEAN, LIMABEAN, WHITEBEAN, BEEFIGE, REDBEET, BLUEBERRY, BROCCOLI, CABBAGE, MELON, CARROT, CASEIN, CASHEWNUT, CAULIFLOWER, CELERY     Note: Lab results reviewed.  [place]  Drug: [name]  reports that he does not currently use drugs. Alcohol:  reports current alcohol use. Tobacco:  reports that he has quit smoking. He has never used smokeless tobacco. Medical:  has a past medical history of  History of colon polyps, History of kidney stones, History of migraine, Hyperlipidemia, Joint pain, and Weakness. Family: family history includes Cancer in his half-brother; Heart attack (age of onset: 85) in his father; Hyperlipidemia in his father;  Hypertension in his father; Lung cancer in his mother.  Past Surgical History:  Procedure Laterality Date   ANTERIOR CERVICAL DECOMP/DISCECTOMY FUSION N/A 01/11/2016   Procedure: C5-6, C6-7 Anterior Cervical Discectomy and Fusion, Allograft, Plate;  Surgeon: Marybelle Killings, MD;  Location: [facility];  Service: Orthopedics;  Laterality: N/A;   CARPAL TUNNEL RELEASE Bilateral    COLONOSCOPY     COLONOSCOPY WITH PROPOFOL N/A 01/25/2020   Procedure: COLONOSCOPY WITH PROPOFOL;  Surgeon: Jonathon Bellows, MD;  Location: [facility] ENDOSCOPY;  Service: Gastroenterology;  Laterality: N/A;   VASECTOMY     Active Ambulatory Problems    Diagnosis Date Noted   HYPERCHOLESTEROLEMIA 06/21/2009   PES PLANUS 05/18/2009   Fatigue 09/03/2011   Low testosterone 05/06/2012   Hx of adenomatous polyp of colon 05/19/2014   Cervical stenosis of spine 01/11/2016   S/P cervical spinal fusion 11/13/2017   Seborrheic keratoses 12/03/2019   Tension headache 07/11/2020   Pain in both testicles 11/14/2020   Cervical radicular pain 03/07/2021   Cervicalgia 03/07/2021   Chronic pain syndrome 03/07/2021   Resolved Ambulatory Problems    Diagnosis Date Noted   SKIN TAG 10/21/2009   Pain in joint, lower leg 05/18/2009   ROTATOR CUFF SYNDROME, LEFT 10/10/2009   METATARSALGIA 06/06/2009   HOARSENESS 12/10/2008   Routine general medical examination at a health care facility 11/16/2010   Hypercholesterolemia 11/16/2010   Dizziness 09/03/2011   SOB (shortness of breath) 09/03/2011   Abdominal pain, unspecified site 06/06/2012   Finger pain 12/22/2012   Routine general medical examination at a health care facility 03/26/2013   Visit for well man health check 03/29/2014   Microscopic hematuria 08/31/2014   Headache 07/27/2015   Elevated blood pressure 09/19/2015   Well woman exam 03/08/2016   Visit for well man health check 03/08/2016   Lightheadedness 01/16/2017   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 In no apparent acute distress Vitals:   03/07/21 0843  BP: (!) 147/98  Pulse: 68  Resp: 16  Temp: (!) 97 F (36.1 C)  SpO2: 100%  Weight: 178 lb (80.7 kg)  Height: $Remove'5\' 5"'PMZLKTP$  (1.651 m)   BMI Assessment: Estimated body mass index is 29.62 kg/m as calculated from the following:   Height as of this encounter: $RemoveBeforeD'5\' 5"'[id number]$  (1.651 m).   Weight as of this encounter: 178 lb (80.7 kg).  BMI interpretation table: BMI level Category Range association with higher incidence of chronic pain  <18 kg/m2 Underweight   18.5-24.9 kg/m2 Ideal body weight   25-29.9 kg/m2 Overweight Increased incidence by 20%  30-34.9 kg/m2 Obese (Class I) Increased incidence by 68%  35-39.9 kg/m2 Severe obesity (Class II) Increased incidence by 136%  >40 kg/m2 Extreme obesity (Class III) Increased incidence by 254%   Patient's current BMI Ideal Body weight  Body mass index is  29.62 kg/m. Ideal body weight: 61.5 kg (135 lb 9.3 oz) Adjusted ideal body weight: 69.2 kg (152 lb 8.8 oz)   BMI Readings from Last 4 Encounters:  03/07/21 29.62 kg/m  11/14/20 30.45 kg/m  07/11/20 29.54 kg/m  01/25/20 27.12 kg/m   Wt Readings from Last 4 Encounters:  03/07/21 178 lb (80.7 kg)  11/14/20 183 lb (83 kg)  07/11/20 177 lb 8 oz (80.5 kg)  01/25/20 168 lb (76.2 kg)    Psych/Mental status: Alert, oriented x 3 (person, place, & time)       Eyes: PERLA Respiratory: No evidence of acute respiratory distress  Cervical Spine Area Exam  Skin & Axial Inspection: Well healed scar from previous spine surgery detected Alignment: Symmetrical Functional ROM: Pain  restricted ROM      Stability: No instability detected Muscle Tone/Strength: Functionally intact. No obvious neuro-muscular anomalies detected. Sensory (Neurological): Dermatomal pain pattern Palpation: No palpable anomalies             Upper Extremity (UE) Exam    Side: Right upper extremity  Side: Left upper extremity  Skin & Extremity Inspection: Skin color, temperature, and hair growth are WNL. No peripheral edema or cyanosis. No masses, redness, swelling, asymmetry, or associated skin lesions. No contractures.  Skin & Extremity Inspection: Skin color, temperature, and hair growth are WNL. No peripheral edema or cyanosis. No masses, redness, swelling, asymmetry, or associated skin lesions. No contractures.  Functional ROM: Unrestricted ROM          Functional ROM: Unrestricted ROM          Muscle Tone/Strength: Functionally intact. No obvious neuro-muscular anomalies detected.  Muscle Tone/Strength: Functionally intact. No obvious neuro-muscular anomalies detected.  Sensory (Neurological): Dermatomal pain pattern          Sensory (Neurological): Unimpaired          Palpation: No palpable anomalies              Palpation: No palpable anomalies              Provocative Test(s):  Phalen's test: deferred Tinel's test: deferred Apley's scratch test (touch opposite shoulder):  Action 1 (Across chest): Decreased ROM Action 2 (Overhead): Decreased ROM Action 3 (LB reach): deferred   Provocative Test(s):  Phalen's test: deferred Tinel's test: deferred Apley's scratch test (touch opposite shoulder):  Action 1 (Across chest): deferred Action 2 (Overhead): deferred Action 3 (LB reach): deferred    Thoracic Spine Area Exam  Skin & Axial Inspection: No masses, redness, or swelling Alignment: Symmetrical Functional ROM: Unrestricted ROM Stability: No instability detected Muscle Tone/Strength: Functionally intact. No obvious neuro-muscular anomalies detected. Sensory (Neurological):  Unimpaired Muscle strength & Tone: No palpable anomalies Lower Extremity Exam    Side: Right lower extremity  Side: Left lower extremity  Stability: No instability observed          Stability: No instability observed          Skin & Extremity Inspection: Skin color, temperature, and hair growth are WNL. No peripheral edema or cyanosis. No masses, redness, swelling, asymmetry, or associated skin lesions. No contractures.  Skin & Extremity Inspection: Skin color, temperature, and hair growth are WNL. No peripheral edema or cyanosis. No masses, redness, swelling, asymmetry, or associated skin lesions. No contractures.  Functional ROM: Unrestricted ROM                  Functional ROM: Unrestricted ROM  Muscle Tone/Strength: Functionally intact. No obvious neuro-muscular anomalies detected.  Muscle Tone/Strength: Functionally intact. No obvious neuro-muscular anomalies detected.  Sensory (Neurological): Unimpaired        Sensory (Neurological): Unimpaired        DTR: Patellar: deferred today Achilles: deferred today Plantar: deferred today  DTR: Patellar: deferred today Achilles: deferred today Plantar: deferred today  Palpation: No palpable anomalies  Palpation: No palpable anomalies    Assessment  Primary Diagnosis & Pertinent Problem List: The primary encounter diagnosis was Cervical radicular pain. Diagnoses of S/P cervical spinal fusion (C5/6, C6/7), Cervicalgia, and Chronic pain syndrome were also pertinent to this visit.  Visit Diagnosis (New problems to examiner): 1. Cervical radicular pain   2. S/P cervical spinal fusion (C5/6, C6/7)   3. Cervicalgia   4. Chronic pain syndrome    Plan of Care (Initial workup plan)   1. Cervical radicular pain -Could be related to adjacent segment disease.  We will start with cervical spine x-ray and depending upon results we will consider cervical MRI to further evaluate for neuroforaminal stenosis - DG Cervical Spine Complete;  Future - gabapentin (NEURONTIN) 100 MG capsule; Take 1-3 capsules (100-300 mg total) by mouth at bedtime. Follow written titration schedule.  Dispense: 90 capsule; Refill: 0  2. S/P cervical spinal fusion (C5/6, C6/7) - DG Cervical Spine Complete; Future - gabapentin (NEURONTIN) 100 MG capsule; Take 1-3 capsules (100-300 mg total) by mouth at bedtime. Follow written titration schedule.  Dispense: 90 capsule; Refill: 0  3. Cervicalgia - DG Shoulder Right; Future - DG Shoulder Left; Future  4. Chronic pain syndrome - DG Shoulder Right; Future - DG Shoulder Left; Future - Ambulatory referral to Physical Therapy    Imaging Orders         DG Cervical Spine Complete         DG Shoulder Right         DG Shoulder Left     Referral Orders         Ambulatory referral to Physical Therapy      Pharmacotherapy (current): Medications ordered:  Meds ordered this encounter  Medications   gabapentin (NEURONTIN) 100 MG capsule    Sig: Take 1-3 capsules (100-300 mg total) by mouth at bedtime. Follow written titration schedule.    Dispense:  90 capsule    Refill:  0    Fill one day early if pharmacy is closed on scheduled refill date. May substitute for generic if available.   Medications administered during this visit: Mare Jeremiah Carpenter. [place] "Jeremiah Carpenter" had no medications administered during this visit.   Provider-requested follow-up: Return in about 3 weeks (around 03/28/2021) for F2F to discuss xrays.  I spent a total of 60 minutes reviewing chart data, face-to-face evaluation with the patient, counseling and coordination of care as detailed above.   Future Appointments  Date Time Provider [place]  03/30/2021  8:20 AM Gillis Santa, MD [facility] None    Note by: Gillis Santa, MD Date: 03/07/2021; Time: 9:25 AM

## 2021-03-09 ENCOUNTER — Encounter: Payer: Self-pay | Admitting: Student in an Organized Health Care Education/Training Program

## 2021-03-09 ENCOUNTER — Encounter: Payer: Self-pay | Admitting: Family Medicine

## 2021-03-09 DIAGNOSIS — M542 Cervicalgia: Secondary | ICD-10-CM

## 2021-03-09 DIAGNOSIS — G894 Chronic pain syndrome: Secondary | ICD-10-CM

## 2021-03-09 DIAGNOSIS — M5412 Radiculopathy, cervical region: Secondary | ICD-10-CM

## 2021-03-09 DIAGNOSIS — Z981 Arthrodesis status: Secondary | ICD-10-CM

## 2021-03-09 MED ORDER — TIZANIDINE HCL 4 MG PO TABS: 2.0000 mg | ORAL_TABLET | Freq: Two times a day (BID) | ORAL | 0 refills | Status: DC | PRN

## 2021-03-09 MED ORDER — TIZANIDINE HCL 4 MG PO TABS: 2.0000 mg | ORAL_TABLET | Freq: Three times a day (TID) | ORAL | 0 refills | Status: AC | PRN

## 2021-03-09 NOTE — Telephone Encounter (Signed)
Called patient to cancel the incorrect prescription for Tizanidine. Also attempted to call patient to inform him of x-ray results and that MRI and Tizanidine were ordered. Message left.

## 2021-03-30 ENCOUNTER — Ambulatory Visit
Payer: BLUE CROSS/BLUE SHIELD | Attending: Student in an Organized Health Care Education/Training Program | Admitting: Student in an Organized Health Care Education/Training Program

## 2021-03-30 ENCOUNTER — Encounter: Payer: Self-pay | Admitting: Student in an Organized Health Care Education/Training Program

## 2021-03-30 ENCOUNTER — Other Ambulatory Visit: Payer: Self-pay

## 2021-03-30 VITALS — BP 129/97 | HR 68 | Temp 97.9°F | Resp 15 | Ht 65.0 in | Wt 178.0 lb

## 2021-03-30 DIAGNOSIS — M5412 Radiculopathy, cervical region: Secondary | ICD-10-CM | POA: Diagnosis not present

## 2021-03-30 DIAGNOSIS — G894 Chronic pain syndrome: Secondary | ICD-10-CM | POA: Diagnosis not present

## 2021-03-30 DIAGNOSIS — M4312 Spondylolisthesis, cervical region: Secondary | ICD-10-CM | POA: Diagnosis not present

## 2021-03-30 DIAGNOSIS — M542 Cervicalgia: Secondary | ICD-10-CM | POA: Insufficient documentation

## 2021-03-30 DIAGNOSIS — Z981 Arthrodesis status: Secondary | ICD-10-CM | POA: Diagnosis not present

## 2021-03-30 NOTE — Progress Notes (Signed)
Safety precautions to be maintained throughout the outpatient stay will include: orient to surroundings, keep bed in low position, maintain call bell within reach at all times, provide assistance with transfer out of bed and ambulation.  

## 2021-03-30 NOTE — Progress Notes (Signed)
PROVIDER NOTE: Information contained herein reflects review and annotations entered in association with encounter. Interpretation of such information and data should be left to medically-trained personnel. Information provided to patient can be located elsewhere in the medical record under "Patient Instructions". Document created using STT-dictation technology, any transcriptional errors that may result from process are unintentional.    Patient: Jeremiah Carpenter  Service Category: E/M  Provider: Gillis Santa, MD  DOB: 12/20/1965  DOS: 03/30/2021  Specialty: Interventional Pain Management  MRN: [id number]  Setting: Ambulatory outpatient  PCP: Lesleigh Noe, MD  Type: Established Patient    Referring Provider: Lesleigh Noe, MD  Location: Office  Delivery: Face-to-face     HPI  [name], a 56 y.o. year old male, is here today because of his Cervicalgia [M54.2]. [name] primary complain today is Arm Pain (Right) and Shoulder Pain (Right) Last encounter: My last encounter with him was on 03/07/2021. Pertinent problems: [name] has Cervical stenosis of spine; S/P cervical spinal fusion; Cervical radicular pain; Cervicalgia; and Chronic pain syndrome on their pertinent problem list. Pain Assessment: Severity of Chronic pain is reported as a 7 /10. Location: Shoulder Right/to right upper arm. Onset: More than a month ago. Quality: Constant, Nagging, Throbbing. Timing: Constant. Modifying factor(s): ice, moving around, chiropractor. Vitals:  height is _0  (1.651 m) and weight is 178 lb (80.7 kg). His temporal temperature is 97.9 F (36.6 C). His blood pressure is 129/97 (abnormal) and his pulse is 68. His respiration is 15 and oxygen saturation is 100%.   Reason for encounter:   Jeremiah Carpenter follows up today to review his cervical spine and shoulder x-rays.  He continues to have neck pain with radiation into his right shoulder.  He does find some benefit with gabapentin.  He has  titrated his dose to 100 mg of 200 mg nightly.  He is not having any side effects.  He did take tizanidine on a couple of occasions but he states that this made him drowsy.  See HPI from initial clinic visit below: Jeremiah Carpenter is a pleasant 56 year old male with a history of ACDF, two-level, C5-C6 and C6-C7 performed in 2017 who presents with a chief complaint of neck pain with radiation into right shoulder.  He also has left hand pain as well.  He has done chiropractic therapy as well as TENS unit.  He is utilizing CBD oil which she finds helpful.  He has not done physical therapy in the last 2 years.  His pain is worse with lateral rotation.  Has had epidural steroid injection in his cervical spine on 1 occasion prior to his ACDF in 2017 which he states was not helpful and very uncomfortable.  He has not tried gabapentin or Lyrica in the past.  Does not want anything that we will result in sedation.  ROS  Constitutional: Denies any fever or chills Gastrointestinal: No reported hemesis, hematochezia, vomiting, or acute GI distress Musculoskeletal: Denies any acute onset joint swelling, redness, loss of ROM, or weakness Neurological:  Neck pain with radiation into right shoulder  Medication Review  atorvastatin, gabapentin, and tiZANidine  History Review  Allergy: [name] is allergic to amoxicillin. Drug: Mr. Mullendore  reports that he does not currently use drugs. Alcohol:  reports current alcohol use. Tobacco:  reports that he has quit smoking. He has never used smokeless tobacco. Social: [name]  reports that he has quit smoking. He has never used smokeless tobacco. He reports current alcohol use.  He reports that he does not currently use drugs. Medical:  has a past medical history of History of colon polyps, History of kidney stones, History of migraine, Hyperlipidemia, Joint pain, and Weakness. Surgical: [name]  has a past surgical history that includes Carpal tunnel release  (Bilateral); Vasectomy; Colonoscopy; Anterior cervical decomp/discectomy fusion (N/A, 01/11/2016); and Colonoscopy with propofol (N/A, 01/25/2020). Family: family history includes Cancer in his half-brother; Heart attack (age of onset: 32) in his father; Hyperlipidemia in his father; Hypertension in his father; Lung cancer in his mother.  Laboratory Chemistry Profile   Renal Lab Results  Component Value Date   BUN 21 07/11/2020   CREATININE 1.02 07/11/2020   GFR 82.06 07/11/2020   GFRAA >60 01/04/2016   GFRNONAA >60 01/04/2016    Hepatic Lab Results  Component Value Date   AST 19 07/11/2020   ALT 20 07/11/2020   ALBUMIN 4.5 07/11/2020   ALKPHOS 43 07/11/2020    Electrolytes Lab Results  Component Value Date   NA 140 07/11/2020   K 4.2 07/11/2020   CL 104 07/11/2020   CALCIUM 9.5 07/11/2020    Bone Lab Results  Component Value Date   TESTOFREE 50.9 10/22/2018   TESTOSTERONE 334 10/22/2018    Inflammation (CRP: Acute Phase) (ESR: Chronic Phase) No results found for: CRP, ESRSEDRATE, LATICACIDVEN       Note: Above Lab results reviewed.  Recent Imaging Review  DG Shoulder Left CLINICAL DATA:  Left shoulder pain  EXAM: LEFT SHOULDER - 2+ VIEW  COMPARISON:  None.  FINDINGS: Normal alignment. No acute fracture. Normal mineralization. The soft tissues are unremarkable.  IMPRESSION: No malalignment or significant degenerative changes.  Electronically Signed   By: Albin Felling M.D.   On: 03/07/2021 15:03 DG Cervical Spine Complete CLINICAL DATA:  Cervicalgia  EXAM: CERVICAL SPINE - COMPLETE 4+ VIEW  COMPARISON:  None.  FINDINGS: Cervical spine visualized to the level of the C7 vertebral body on the lateral view. The patient is status post anterior cervical fusion spanning C5-C7. Hardware appears normally aligned without fracture. There is approximately 3 mm of anterolisthesis of C3 on C4. Mild-to-moderate disc space disease with anterior  projecting osteophyte above the level of fusion at C4-C5. The cervical vertebral body heights are maintained without compression fracture. The prevertebral soft tissues are unremarkable.  IMPRESSION: Status post anterior cervical fusion spanning C5-C7 without hardware complicating feature. Degenerative changes above the level of fusion including 3 mm of anterolisthesis at C3-C4 as described.  Electronically Signed   By: Albin Felling M.D.   On: 03/07/2021 15:02 DG Shoulder Right CLINICAL DATA:  Right shoulder pain  EXAM: RIGHT SHOULDER - 2+ VIEW  COMPARISON:  None.  FINDINGS: Normal alignment. No acute fracture. Normal mineralization. The soft tissues are unremarkable.  IMPRESSION: No malalignment or significant degenerative changes.  Electronically Signed   By: Albin Felling M.D.   On: 03/07/2021 14:59 Note: Reviewed        Physical Exam  General appearance: Well nourished, well developed, and well hydrated. In no apparent acute distress Mental status: Alert, oriented x 3 (person, place, & time)       Respiratory: No evidence of acute respiratory distress Eyes: PERLA Vitals: BP (!) 129/97    Pulse 68    Temp 97.9 F (36.6 C) (Temporal)    Resp 15    Ht _0  (1.651 m)    Wt 178 lb (80.7 kg)    SpO2 100%    BMI 29.62 kg/m  BMI: Estimated body mass index is 29.62 kg/m as calculated from the following:   Height as of this encounter: _0  (1.651 m).   Weight as of this encounter: 178 lb (80.7 kg). Ideal: Ideal body weight: 61.5 kg (135 lb 9.3 oz) Adjusted ideal body weight: 69.2 kg (152 lb 8.8 oz)  Cervical Spine Area Exam  Skin & Axial Inspection: Well healed scar from previous spine surgery detected Alignment: Symmetrical Functional ROM: Pain restricted ROM      Stability: No instability detected Muscle Tone/Strength: Functionally intact. No obvious neuro-muscular anomalies detected. Sensory (Neurological): Dermatomal pain pattern Palpation: No palpable  anomalies             Upper Extremity (UE) Exam      Side: Right upper extremity   Side: Left upper extremity  Skin & Extremity Inspection: Skin color, temperature, and hair growth are WNL. No peripheral edema or cyanosis. No masses, redness, swelling, asymmetry, or associated skin lesions. No contractures.   Skin & Extremity Inspection: Skin color, temperature, and hair growth are WNL. No peripheral edema or cyanosis. No masses, redness, swelling, asymmetry, or associated skin lesions. No contractures.  Functional ROM: Unrestricted ROM           Functional ROM: Unrestricted ROM          Muscle Tone/Strength: Functionally intact. No obvious neuro-muscular anomalies detected.   Muscle Tone/Strength: Functionally intact. No obvious neuro-muscular anomalies detected.  Sensory (Neurological): Dermatomal pain pattern           Sensory (Neurological): Unimpaired          Palpation: No palpable anomalies               Palpation: No palpable anomalies              Provocative Test(s):  Phalen's test: deferred Tinel's test: deferred Apley's scratch test (touch opposite shoulder):  Action 1 (Across chest): Decreased ROM Action 2 (Overhead): Decreased ROM Action 3 (LB reach): deferred     Provocative Test(s):  Phalen's test: deferred Tinel's test: deferred Apley's scratch test (touch opposite shoulder):  Action 1 (Across chest): deferred Action 2 (Overhead): deferred Action 3 (LB reach): deferred      Assessment   Status Diagnosis  Persistent Persistent Persistent 1. Cervicalgia   2. S/P cervical spinal fusion (C5-C7)   3. Cervical radicular pain   4. Anterolisthesis of cervical spine (C3-C4)   5. Chronic pain syndrome      Updated Problems: Problem  Cervical Radicular Pain  Cervicalgia  Chronic Pain Syndrome  S/P Cervical Spinal Fusion  Cervical Stenosis of Spine  Anterolisthesis of cervical spine (C3-C4)    Plan of Care  1.  Patient has evidence of adjacent segment disease  above his fusion.  I have ordered a cervical MRI for further evaluation.  Treatment plan may include cervical epidural steroid injection based upon results. 2.  Increase gabapentin to 300 mg nightly 3.  Continue tizanidine as needed for cervical paraspinal muscle spasms. 4.  We will call patient with cervical MRI results and discussion of treatment plan  Follow-up plan:   Return for Will call pt with MRI results.    Recent Visits Date Type Provider Dept  03/07/21 Office Visit Gillis Santa, MD [facility]  Showing recent visits within past 90 days and meeting all other requirements Today's Visits Date Type Provider Dept  03/30/21 Office Visit Gillis Santa, MD [facility]  Showing today's visits and meeting all other requirements Future  Appointments No visits were found meeting these conditions. Showing future appointments within next 90 days and meeting all other requirements  I discussed the assessment and treatment plan with the patient. The patient was provided an opportunity to ask questions and all were answered. The patient agreed with the plan and demonstrated an understanding of the instructions.  Patient advised to call back or seek an in-person evaluation if the symptoms or condition worsens.  Duration of encounter:55mnutes.  Note by: BGillis Santa MD Date: 03/30/2021; Time: 9:19 AM

## 2021-04-01 ENCOUNTER — Other Ambulatory Visit: Payer: Self-pay | Admitting: Student in an Organized Health Care Education/Training Program

## 2021-04-05 ENCOUNTER — Telehealth: Payer: Self-pay

## 2021-04-05 NOTE — Telephone Encounter (Signed)
Insurance denied MRI No PT or home exercise

## 2021-04-18 ENCOUNTER — Ambulatory Visit: Payer: BLUE CROSS/BLUE SHIELD

## 2021-04-19 ENCOUNTER — Other Ambulatory Visit: Payer: Self-pay | Admitting: Student in an Organized Health Care Education/Training Program

## 2021-04-19 DIAGNOSIS — Z981 Arthrodesis status: Secondary | ICD-10-CM

## 2021-04-19 DIAGNOSIS — M5412 Radiculopathy, cervical region: Secondary | ICD-10-CM

## 2021-04-26 ENCOUNTER — Ambulatory Visit: Payer: BLUE CROSS/BLUE SHIELD | Attending: Student in an Organized Health Care Education/Training Program

## 2021-04-26 ENCOUNTER — Other Ambulatory Visit: Payer: Self-pay | Admitting: Student in an Organized Health Care Education/Training Program

## 2021-04-26 DIAGNOSIS — M542 Cervicalgia: Secondary | ICD-10-CM | POA: Insufficient documentation

## 2021-04-26 DIAGNOSIS — G8929 Other chronic pain: Secondary | ICD-10-CM | POA: Diagnosis not present

## 2021-04-26 DIAGNOSIS — M25511 Pain in right shoulder: Secondary | ICD-10-CM | POA: Insufficient documentation

## 2021-04-26 DIAGNOSIS — R293 Abnormal posture: Secondary | ICD-10-CM | POA: Insufficient documentation

## 2021-04-26 DIAGNOSIS — M5412 Radiculopathy, cervical region: Secondary | ICD-10-CM

## 2021-04-26 DIAGNOSIS — Z981 Arthrodesis status: Secondary | ICD-10-CM

## 2021-04-26 DIAGNOSIS — G894 Chronic pain syndrome: Secondary | ICD-10-CM | POA: Diagnosis not present

## 2021-04-26 NOTE — Therapy (Signed)
[place] MAIN [facility] SERVICES [place], [place], [address] Phone: [phone]   Fax:  [phone]  Physical Therapy Evaluation  Patient Details  Name: Jeremiah Carpenter MRN: [id number] Date of Birth: Jun 25, 1965 Referring Provider (PT): Gillis Santa   Encounter Date: 04/26/2021   PT End of Session - 04/26/21 1557     Visit Number 1    Number of Visits 12    Date for PT Re-Evaluation 06/07/21    Authorization Type 1/10 eval 04/26/21    PT Start Time 1515    PT Stop Time 1604    PT Time Calculation (min) 49 min    Activity Tolerance Patient tolerated treatment well    Behavior During Therapy [facility] for tasks assessed/performed             Past Medical History:  Diagnosis Date   History of colon polyps    benign   History of kidney stones    History of migraine    last one about a month ago   Hyperlipidemia    takes Atorvastatin daily   Joint pain    Weakness    numbness mainly left hand and rarely on right    Past Surgical History:  Procedure Laterality Date   ANTERIOR CERVICAL DECOMP/DISCECTOMY FUSION N/A 01/11/2016   Procedure: C5-6, C6-7 Anterior Cervical Discectomy and Fusion, Allograft, Plate;  Surgeon: Marybelle Killings, MD;  Location: [place];  Service: Orthopedics;  Laterality: N/A;   CARPAL TUNNEL RELEASE Bilateral    COLONOSCOPY     COLONOSCOPY WITH PROPOFOL N/A 01/25/2020   Procedure: COLONOSCOPY WITH PROPOFOL;  Surgeon: Jonathon Bellows, MD;  Location: [facility] ENDOSCOPY;  Service: Gastroenterology;  Laterality: N/A;   VASECTOMY      There were no vitals filed for this visit.    Subjective Assessment - 04/26/21 1523     Subjective Patient presents to physical therapy for cervical pain radiating to R shoulder and head.    Pertinent History Jeremiah Carpenter is a pleasant 56 year old male with a history of ACDF, two-level, C5-C6 and C6-C7 performed in 2017 who presents with a chief complaint of neck pain with radiation into right  shoulder.   He has done chiropractic therapy as well as TENS unit. He has not done physical therapy in the last 2 years.  His pain is worse with lateral rotation. Pain began in 2019, has been going to [employer] and acupuncture. Pain worsens when he picks up objects/lifting. Patient works for an [employer] fund[employer]. PMH includes tension headaches, cervicalgia, HLD.    Limitations Lifting;Standing;House hold activities;Writing    How long can you sit comfortably? painful when reaching with hands    How long can you stand comfortably? n/a    How long can you walk comfortably? n/a    Diagnostic tests Degenerative changes above the level of fusion  including 3 mm of anterolisthesis at C3-C4 as described.    Patient Stated Goals to get his MRI    Currently in Pain? Yes    Pain Score 4     Pain Location Head   neck and shoulder   Pain Orientation Right    Pain Descriptors / Indicators Aching;Spasm;Tightness    Pain Type Chronic pain;Surgical pain    Pain Radiating Towards R shoulder down into hand    Pain Onset More than a month ago    Pain Frequency Intermittent    Aggravating Factors  lifting heavy weights, looking overhead    Pain Relieving  Factors chiropractic, acupuncture                [facility] PT Assessment - 04/26/21 0001       Assessment   Medical Diagnosis chronic pain syndrome, cervicalgia    Referring Provider (PT) Holley Raring, [place]    Onset Date/Surgical Date --   2019   Hand Dominance Right    Prior Therapy prior to ACDF surgery      Precautions   Precautions None      Restrictions   Weight Bearing Restrictions No      Balance Screen   Has the patient fallen in the past 6 months No    Has the patient had a decrease in activity level because of a fear of falling?  No    Is the patient reluctant to leave their home because of a fear of falling?  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      Observation/Other Assessments   Focus on Therapeutic Outcomes (FOTO)  59                   PAIN: Headache: worst : 9/10; least 1-2/10; current 4/10 Neck: worst pain: 9/10; least 2/10; current pain 2/10 R shoulder: worst: 9-10/10; least 2-3/10  current pain 3/10  POSTURE: Slight rounded shoulders R shoulder elevated   PROM/AROM: PROM BUE: AROM BUE  Right Left  Shoulder Flexion 130* WFL  Shoulder Abduction  [facility]  ER    IR    Pain    AROM cervical     Right Left  Flexion 35  Extension 45  Side Bending 25 30  Rotation 45 60    Accessory Motions:  R shoulder:  -AP : hypomobile painful -PA hypomobile painful -inferior glide hypomobile   Repeated Movements No centralization or peripheralization of symptoms with repeated cervical protraction and retraction.    Muscle Length Upper Trap: L 4; R 4.5     Passive Accessory Intervertebral Motion (PAIVM) Pt denies reproduction of neck pain with CPA C2-T7 and UPA bilaterally C2-T7. Generally hypomobile and painful throughout  STRENGTH:  Graded on a 0-5 scale *unable to strength test RUE due to pain    SOMATOSENSORY:  Any N & T in extremities or weakness: reports :         Sensation           Intact      Diminished         Absent  Light touch LEs                                SPECIAL TESTS: SPECIAL TESTS  Distraction Test: Positive   ULTT Median: R: Positive L: Negative ULTT Ulnar: R: Negative L: Negative ULTT Radial: R: Negative L: Negative Rotator Cuff  Drop Arm Test: Negative Painful Arc (Pain from 60 to 120 degrees scaption): Positive Infraspinatus Muscle Test: Negative  Subacromial Impingement Hawkins-Kennedy: Positive Neer (Block scapula, PROM flexion): Positive Painful Arc (Pain from 60 to 120 degrees scaption): Positive Empty Can: Positive External Rotation Resistance: Positive  Positive  Hawkins-Kennedy, Painful arc sign, Infraspinatus muscle test then +LR: 10.56 of some type of impingement present, 2/3 tests: +LR 5.06, -LR 0.17, Positive 3/5 Hawkins-Kennedy, neer, painful arc, empty can,  and external rotation resistance then SN: .75 (.54-.96) SP: .74 (.61-.88) +LR: 2.93 (1.60-5.36) -LR: .34 (.14-.80)  Labral Tear Biceps Load II (120 elevation, full ER, 90 elbow flexion, full supination, resisted elbow flexion): Negative Crank (160 scaption, axial load with IR/ER): Negative Active Compression Test: Negative  OUTCOME MEASURES: TEST Outcome Interpretation  NDI:  20%   FOTO 59 62         Objective measurements completed on examination: See above findings.      Access Code: [email or web address] URL: [email or web address] Date: 04/26/2021 Prepared by: Janna Arch  Exercises Seated Scapular Retraction - 1 x daily - 7 x weekly - 2 sets - 10 reps - 5 hold Standing Median Nerve Glide - 1 x daily - 7 x weekly - 2 sets - 10 reps - 5 hold Ulnar Nerve Flossing - 1 x daily - 7 x weekly - 2 sets - 10 reps - 5 hold Cervical Extension AROM with Strap - 1 x daily - 7 x weekly - 2 sets - 10 reps - 5 hold     Patient is a pleasant 56 year old male who presents to physical therapy for cervical pain that radiates into head and R arm. Patient had an ACDF surgery in 2017 for C5-6 and C6-7. He is hypomobile throughout his cervical and thoracic spine and has significant winging of R scapular. His RUE is involved and indicative of additional impingement of glenohumeral joint in addition to his cervical pain. Patient given PT card to give to chiropractor and acupuncturist to create a comprehensive plan of care. Patient has limited cervical ROM and weak postural musculature. Patient will benefit from skilled physical therapy to decrease pain, improve posture, and improve ROM for return to PLOF.     PT Education - 04/26/21 1556     Education Details goals, POC, HEP    Person(s)  Educated Patient    Methods Other (comment);Explanation;Demonstration;Tactile cues;Verbal cues;Handout    Comprehension Verbalized understanding;Returned demonstration;Verbal cues required;Tactile cues required;Need further instruction              PT Short Term Goals - 04/26/21 1624       PT SHORT TERM GOAL #1   Title Patient will be independent in home exercise program to improve strength/mobility for better functional independence with ADLs.    Baseline 3/1: HEP given    Time 2    Period Weeks    Status New    Target Date 05/10/21               PT Long Term Goals - 04/26/21 1624       PT LONG TERM GOAL #1   Title Patient will increase FOTO score to equal to or greater than 62%    to demonstrate statistically significant improvement in mobility and quality of life.    Baseline 3/1: 59%    Time 6    Period Weeks    Status New    Target Date 06/07/21      PT LONG TERM GOAL #2   Title Patient will report a worst pain of 3/10 on VAS in head, neck and R shoulder to improve tolerance with ADLs and reduced symptoms with activities.    Baseline 3/1: 9/10 pain in all    Time 6    Period Weeks    Status New    Target Date 06/07/21      PT LONG TERM GOAL #3   Title Patient will reduce Neck Disability Index score to <  10% to demonstrate minimal disability with ADLs including improved sleeping tolerance, sitting tolerance, etc for better mobility at home and work.    Baseline 3/1: 20%    Time 6    Period Weeks    Status New    Target Date 06/07/21      PT LONG TERM GOAL #4   Title Patient will improve AROM UE so they are able to perform overhead ADL's such as reaching into cabinets    Baseline 3/1: has increased pain reaching overhead    Time 6    Period Weeks    Status New    Target Date 06/07/21                    Plan - 04/26/21 1621     Clinical Impression Statement Patient is a pleasant 56 year old male who presents to physical therapy for  cervical pain that radiates into head and R arm. Patient had an ACDF surgery in 2017 for C5-6 and C6-7. He is hypomobile throughout his cervical and thoracic spine and has significant winging of R scapular. His RUE is involved and indicative of additional impingement of glenohumeral joint in addition to his cervical pain. Patient given PT card to give to chiropractor and acupuncturist to create a comprehensive plan of care. Patient has limited cervical ROM and weak postural musculature. Patient will benefit from skilled physical therapy to decrease pain, improve posture, and improve ROM for return to PLOF.    Personal Factors and Comorbidities Age;Comorbidity 3+;Profession;Past/Current Experience;Time since onset of injury/illness/exacerbation    Comorbidities ACDF C5-6,C6-7; HLD, migraine    Examination-Activity Limitations Carry;Locomotion Level;Lift;Reach Overhead    Examination-Participation Restrictions Cleaning;Driving;Meal Prep;Laundry;Occupation;Shop;Volunteer;Yard Work;Other    Stability/Clinical Decision Making Evolving/Moderate complexity    Clinical Decision Making Moderate    Rehab Potential Fair    PT Frequency 2x / week    PT Duration 6 weeks    PT Treatment/Interventions ADLs/Self Care Home Management;Canalith Repostioning;Cryotherapy;Electrical Stimulation;Iontophoresis 4mg /ml Dexamethasone;Moist Heat;Ultrasound;Traction;Functional mobility training;Therapeutic activities;Neuromuscular re-education;Balance training;Therapeutic exercise;Patient/family education;Manual techniques;Passive range of motion;Dry needling;Vestibular;Vasopneumatic Device;Taping;Splinting;Energy conservation;Visual/perceptual remediation/compensation;Spinal Manipulations;Joint Manipulations    PT Next Visit Plan nerve glides, mobilizations, postural strengthening exercises    PT Home Exercise Plan see above    Consulted and Agree with Plan of Care Patient             Patient will benefit from skilled  therapeutic intervention in order to improve the following deficits and impairments:  Decreased range of motion, Decreased strength, Hypomobility, Impaired flexibility, Increased muscle spasms, Impaired UE functional use, Improper body mechanics, Postural dysfunction, Pain  Visit Diagnosis: Cervicalgia  Chronic right shoulder pain  Abnormal posture     Problem List Patient Active Problem List   Diagnosis Date Noted   Anterolisthesis of cervical spine (C3-C4) 03/30/2021   Cervical radicular pain 03/07/2021   Cervicalgia 03/07/2021   Chronic pain syndrome 03/07/2021   Pain in both testicles 11/14/2020   Tension headache 07/11/2020   Seborrheic keratoses 12/03/2019   S/P cervical spinal fusion 11/13/2017   Cervical stenosis of spine 01/11/2016   Hx of adenomatous polyp of colon 05/19/2014   Low testosterone 05/06/2012   Fatigue 09/03/2011   HYPERCHOLESTEROLEMIA 06/21/2009   PES PLANUS 05/18/2009    Janna Arch, PT, DPT  04/26/2021, 4:27 PM  [place] MAIN [facility] SERVICES [address] [place], [place], [address] Phone: [phone]   Fax:  [phone]  Name: Jeremiah Carpenter MRN: [id number] Date of Birth: [birth year]

## 2021-04-26 NOTE — Patient Instructions (Signed)
Access Code: [email or web address] ?URL: [email or web address] ?Date: 04/26/2021 ?Prepared by: Janna Arch ? ?Exercises ?Seated Scapular Retraction - 1 x daily - 7 x weekly - 2 sets - 10 reps - 5 hold ?Standing Median Nerve Glide - 1 x daily - 7 x weekly - 2 sets - 10 reps - 5 hold ?Ulnar Nerve Flossing - 1 x daily - 7 x weekly - 2 sets - 10 reps - 5 hold ?Cervical Extension AROM with Strap - 1 x daily - 7 x weekly - 2 sets - 10 reps - 5 hold ? ?

## 2021-04-27 ENCOUNTER — Other Ambulatory Visit: Payer: Self-pay | Admitting: *Deleted

## 2021-04-27 DIAGNOSIS — M5412 Radiculopathy, cervical region: Secondary | ICD-10-CM

## 2021-04-27 DIAGNOSIS — Z981 Arthrodesis status: Secondary | ICD-10-CM

## 2021-04-27 MED ORDER — GABAPENTIN 100 MG PO CAPS: 100.0000 mg | ORAL_CAPSULE | Freq: Every day | ORAL | 0 refills | Status: DC

## 2021-04-28 ENCOUNTER — Ambulatory Visit: Payer: BLUE CROSS/BLUE SHIELD

## 2021-05-02 ENCOUNTER — Ambulatory Visit: Payer: BLUE CROSS/BLUE SHIELD

## 2021-05-02 ENCOUNTER — Other Ambulatory Visit: Payer: Self-pay

## 2021-05-02 DIAGNOSIS — R293 Abnormal posture: Secondary | ICD-10-CM | POA: Diagnosis not present

## 2021-05-02 DIAGNOSIS — G8929 Other chronic pain: Secondary | ICD-10-CM

## 2021-05-02 DIAGNOSIS — G894 Chronic pain syndrome: Secondary | ICD-10-CM | POA: Diagnosis not present

## 2021-05-02 DIAGNOSIS — M542 Cervicalgia: Secondary | ICD-10-CM

## 2021-05-02 DIAGNOSIS — M25511 Pain in right shoulder: Secondary | ICD-10-CM | POA: Diagnosis not present

## 2021-05-02 NOTE — Therapy (Signed)
[place] MAIN [facility] SERVICES [address] [place], [place], [address] Phone: [phone]   Fax:  [phone]  Physical Therapy Treatment  Patient Details  Name: Jeremiah Carpenter MRN: [id number] Date of Birth: [birth year] Referring Provider (PT): Gillis Santa   Encounter Date: 05/02/2021   PT End of Session - 05/02/21 1739     Visit Number 2    Number of Visits 12    Date for PT Re-Evaluation 06/07/21    Authorization Type 2/10 eval 04/26/21    PT Start Time 6962    PT Stop Time 1600    PT Time Calculation (min) 45 min    Activity Tolerance Patient tolerated treatment well    Behavior During Therapy [facility] for tasks assessed/performed             Past Medical History:  Diagnosis Date   History of colon polyps    benign   History of kidney stones    History of migraine    last one about a month ago   Hyperlipidemia    takes Atorvastatin daily   Joint pain    Weakness    numbness mainly left hand and rarely on right    Past Surgical History:  Procedure Laterality Date   ANTERIOR CERVICAL DECOMP/DISCECTOMY FUSION N/A 01/11/2016   Procedure: C5-6, C6-7 Anterior Cervical Discectomy and Fusion, Allograft, Plate;  Surgeon: Marybelle Killings, MD;  Location: [place];  Service: Orthopedics;  Laterality: N/A;   CARPAL TUNNEL RELEASE Bilateral    COLONOSCOPY     COLONOSCOPY WITH PROPOFOL N/A 01/25/2020   Procedure: COLONOSCOPY WITH PROPOFOL;  Surgeon: Jonathon Bellows, MD;  Location: [facility] ENDOSCOPY;  Service: Gastroenterology;  Laterality: N/A;   VASECTOMY      There were no vitals filed for this visit.   Subjective Assessment - 05/02/21 1738     Subjective Patient reports he is having increased bilateral hand symptoms and neck pain.    Pertinent History Jeremiah Carpenter is a pleasant 56 year old male with a history of ACDF, two-level, C5-C6 and C6-C7 performed in 2017 who presents with a chief complaint of neck pain with radiation into right shoulder.    He has done chiropractic therapy as well as TENS unit. He has not done physical therapy in the last 2 years.  His pain is worse with lateral rotation. Pain began in 2019, has been going to [employer] and acupuncture. Pain worsens when he picks up objects/lifting. Patient works for an [employer]. PMH includes tension headaches, cervicalgia, HLD.    Limitations Lifting;Standing;House hold activities;Writing    How long can you sit comfortably? painful when reaching with hands    How long can you stand comfortably? n/a    How long can you walk comfortably? n/a    Diagnostic tests Degenerative changes above the level of fusion  including 3 mm of anterolisthesis at C3-C4 as described.    Patient Stated Goals to get his MRI    Currently in Pain? Yes    Pain Score 7     Pain Location Arm    Pain Orientation Right;Left    Pain Descriptors / Indicators Aching    Pain Type Neuropathic pain    Pain Onset More than a month ago    Pain Frequency Intermittent               Manual: Cervical side bent with overpressure at glenohumeral joint and occipital region 3x30 second holds each direction Cervical rotation with overpressure  at glenohumeral joint and occipital region 3x30 second holds Median nerve glide RUE 10x Ulnar nerve glide RUE 10x Grade II UPA and CPA 5 seconds each level thoracic and cervical Bicep trigger point release with movement x 3 minutes  TherEx Scapular retraction against perturbation 30 seconds x2 trials Chin tucks 10x 3 second holds into PT hand for coordination of movement Scapular retraction with overhead RTB raise in Y position 10x RTB ER 15x  Trigger Point Dry Needling (TDN), unbilled Education performed with patient regarding potential benefit of TDN. Reviewed precautions and risks with patient. Reviewed special precautions/risks over lung fields which include pneumothorax. Reviewed signs and symptoms of pneumothorax and advised pt to go to ER  immediately if these symptoms develop advise them of dry needling treatment. Extensive time spent with pt to ensure full understanding of TDN risks. Pt provided verbal consent to treatment. TDN performed to  with 0.3 x 30 single needle placements with local twitch response (LTR). Pistoning technique utilized. Improved pain-free motion following intervention. Muscles targeted bilateral biceps and upper traps x 7 minutes   Patient introduced to manual, therex, and TDN with half point pain improvement by end of session. Patient has radiating symptoms bilaterally to hands that is reduced mildly with TDN to biceps indicating nerve tension/entrapment. Patient had tenderness to UPA and CPA of cervical spine indicating area of continued focus in future session.  Patient has limited cervical ROM and weak postural musculature. Patient will benefit from skilled physical therapy to decrease pain, improve posture, and improve ROM for return to PLOF                             PT Education - 05/02/21 1738     Education Details TDN, exercise technique, manual    Person(s) Educated Patient    Methods Explanation;Demonstration;Tactile cues;Verbal cues    Comprehension Verbalized understanding;Returned demonstration;Verbal cues required;Tactile cues required              PT Short Term Goals - 04/26/21 1624       PT SHORT TERM GOAL #1   Title Patient will be independent in home exercise program to improve strength/mobility for better functional independence with ADLs.    Baseline 3/1: HEP given    Time 2    Period Weeks    Status New    Target Date 05/10/21               PT Long Term Goals - 04/26/21 1624       PT LONG TERM GOAL #1   Title Patient will increase FOTO score to equal to or greater than 62%    to demonstrate statistically significant improvement in mobility and quality of life.    Baseline 3/1: 59%    Time 6    Period Weeks    Status New    Target Date  06/07/21      PT LONG TERM GOAL #2   Title Patient will report a worst pain of 3/10 on VAS in head, neck and R shoulder to improve tolerance with ADLs and reduced symptoms with activities.    Baseline 3/1: 9/10 pain in all    Time 6    Period Weeks    Status New    Target Date 06/07/21      PT LONG TERM GOAL #3   Title Patient will reduce Neck Disability Index score to <10% to demonstrate minimal disability with ADLs including  improved sleeping tolerance, sitting tolerance, etc for better mobility at home and work.    Baseline 3/1: 20%    Time 6    Period Weeks    Status New    Target Date 06/07/21      PT LONG TERM GOAL #4   Title Patient will improve AROM UE so they are able to perform overhead ADL's such as reaching into cabinets    Baseline 3/1: has increased pain reaching overhead    Time 6    Period Weeks    Status New    Target Date 06/07/21                   Plan - 05/02/21 1742     Clinical Impression Statement Patient introduced to manual, therex, and TDN with half point pain improvement by end of session. Patient has radiating symptoms bilaterally to hands that is reduced mildly with TDN to biceps indicating nerve tension/entrapment. Patient had tenderness to UPA and CPA of cervical spine indicating area of continued focus in future session.  Patient has limited cervical ROM and weak postural musculature. Patient will benefit from skilled physical therapy to decrease pain, improve posture, and improve ROM for return to PLOF    Personal Factors and Comorbidities Age;Comorbidity 3+;Profession;Past/Current Experience;Time since onset of injury/illness/exacerbation    Comorbidities ACDF C5-6,C6-7; HLD, migraine    Examination-Activity Limitations Carry;Locomotion Level;Lift;Reach Overhead    Examination-Participation Restrictions Cleaning;Driving;Meal Prep;Laundry;Occupation;Shop;Volunteer;Yard Work;Other    Stability/Clinical Decision Making Evolving/Moderate  complexity    Rehab Potential Fair    PT Frequency 2x / week    PT Duration 6 weeks    PT Treatment/Interventions ADLs/Self Care Home Management;Canalith Repostioning;Cryotherapy;Electrical Stimulation;Iontophoresis '4mg'$ /ml Dexamethasone;Moist Heat;Ultrasound;Traction;Functional mobility training;Therapeutic activities;Neuromuscular re-education;Balance training;Therapeutic exercise;Patient/family education;Manual techniques;Passive range of motion;Dry needling;Vestibular;Vasopneumatic Device;Taping;Splinting;Energy conservation;Visual/perceptual remediation/compensation;Spinal Manipulations;Joint Manipulations    PT Next Visit Plan nerve glides, mobilizations, postural strengthening exercises    PT Home Exercise Plan see above    Consulted and Agree with Plan of Care Patient             Patient will benefit from skilled therapeutic intervention in order to improve the following deficits and impairments:  Decreased range of motion, Decreased strength, Hypomobility, Impaired flexibility, Increased muscle spasms, Impaired UE functional use, Improper body mechanics, Postural dysfunction, Pain  Visit Diagnosis: Cervicalgia  Chronic right shoulder pain  Abnormal posture     Problem List Patient Active Problem List   Diagnosis Date Noted   Anterolisthesis of cervical spine (C3-C4) 03/30/2021   Cervical radicular pain 03/07/2021   Cervicalgia 03/07/2021   Chronic pain syndrome 03/07/2021   Pain in both testicles 11/14/2020   Tension headache 07/11/2020   Seborrheic keratoses 12/03/2019   S/P cervical spinal fusion 11/13/2017   Cervical stenosis of spine 01/11/2016   Hx of adenomatous polyp of colon 05/19/2014   Low testosterone 05/06/2012   Fatigue 09/03/2011   HYPERCHOLESTEROLEMIA 06/21/2009   PES PLANUS 05/18/2009   Janna Arch, PT, DPT  05/02/2021, 5:43 PM  [place] MAIN [facility] SERVICES [address] [place], [place], [address] Phone:  [phone]   Fax:  [phone]  Name: Jeremiah Carpenter MRN: [id number] Date of Birth: 26-Jun-1965

## 2021-05-05 ENCOUNTER — Ambulatory Visit: Payer: BLUE CROSS/BLUE SHIELD

## 2021-05-05 ENCOUNTER — Other Ambulatory Visit: Payer: Self-pay

## 2021-05-05 DIAGNOSIS — R293 Abnormal posture: Secondary | ICD-10-CM

## 2021-05-05 DIAGNOSIS — M25511 Pain in right shoulder: Secondary | ICD-10-CM | POA: Diagnosis not present

## 2021-05-05 DIAGNOSIS — G8929 Other chronic pain: Secondary | ICD-10-CM | POA: Diagnosis not present

## 2021-05-05 DIAGNOSIS — G894 Chronic pain syndrome: Secondary | ICD-10-CM | POA: Diagnosis not present

## 2021-05-05 DIAGNOSIS — M542 Cervicalgia: Secondary | ICD-10-CM | POA: Diagnosis not present

## 2021-05-05 NOTE — Therapy (Signed)
[place] MAIN [facility] SERVICES [place], [place], [address] Phone: [phone]   Fax:  [phone]  Physical Therapy Treatment  Patient Details  Name: Jeremiah Carpenter MRN: [id number] Date of Birth: 1965-08-10 Referring Provider (PT): Gillis Santa   Encounter Date: 05/05/2021   PT End of Session - 05/05/21 1123     Visit Number 3    Number of Visits 12    Date for PT Re-Evaluation 06/07/21    Authorization Type 2/10 eval 04/26/21    Progress Note Due on Visit 10    PT Start Time 0800    PT Stop Time 0848    PT Time Calculation (min) 48 min    Activity Tolerance Patient tolerated treatment well    Behavior During Therapy [facility] for tasks assessed/performed             Past Medical History:  Diagnosis Date   History of colon polyps    benign   History of kidney stones    History of migraine    last one about a month ago   Hyperlipidemia    takes Atorvastatin daily   Joint pain    Weakness    numbness mainly left hand and rarely on right    Past Surgical History:  Procedure Laterality Date   ANTERIOR CERVICAL DECOMP/DISCECTOMY FUSION N/A 01/11/2016   Procedure: C5-6, C6-7 Anterior Cervical Discectomy and Fusion, Allograft, Plate;  Surgeon: Marybelle Killings, MD;  Location: [place];  Service: Orthopedics;  Laterality: N/A;   CARPAL TUNNEL RELEASE Bilateral    COLONOSCOPY     COLONOSCOPY WITH PROPOFOL N/A 01/25/2020   Procedure: COLONOSCOPY WITH PROPOFOL;  Surgeon: Jonathon Bellows, MD;  Location: [facility] ENDOSCOPY;  Service: Gastroenterology;  Laterality: N/A;   VASECTOMY      There were no vitals filed for this visit.   Subjective Assessment - 05/05/21 1125     Subjective Patient reports he was sore after last visit but feels he is making progress and states is a pretty good day- Reports 3/10 arm pain.    Pertinent History Jeremiah Carpenter is a pleasant 56 year old male with a history of ACDF, two-level, C5-C6 and C6-C7 performed in  2017 who presents with a chief complaint of neck pain with radiation into right shoulder.   He has done chiropractic therapy as well as TENS unit. He has not done physical therapy in the last 2 years.  His pain is worse with lateral rotation. Pain began in 2019, has been going to [employer] and acupuncture. Pain worsens when he picks up objects/lifting. Patient works for an [employer] fund[employer]. PMH includes tension headaches, cervicalgia, HLD.    Limitations Lifting;Standing;House hold activities;Writing    How long can you sit comfortably? painful when reaching with hands    How long can you stand comfortably? n/a    How long can you walk comfortably? n/a    Diagnostic tests Degenerative changes above the level of fusion  including 3 mm of anterolisthesis at C3-C4 as described.    Patient Stated Goals to get his MRI    Currently in Pain? Yes    Pain Score 3     Pain Location Arm    Pain Orientation Right;Left    Pain Descriptors / Indicators Aching    Pain Type Neuropathic pain    Pain Radiating Towards R shoulder down into his hand    Pain Onset More than a month ago    Pain Frequency Intermittent  INTETRVENTIONS:   Pre treatment measurement- cervical  Rotation (L/R) = 57/55 SB (L/R)= 40/34  Manual: Cervical side bend with overpressure at glenohumeral joint and occipital region 3x30 second holds each direction (Increased tightness  Cervical rotation with overpressure at glenohumeral joint and occipital region 3x30 second holds Median nerve glide RUE 10x Ulnar nerve glide RUE 10x Grade II UPA and CPA 5 seconds each level thoracic and cervical    TherEx Patient reviewed previous prescribed HEP Scapular retraction  Median and ulnar nerve glide Instructed patient in the following:   AAROM Cervical Rotation using towel- hold 20-30 sec x 3 each side- Patient reported "I can really feel that." AAROM Cervical Sidebend stretch - hold 20-30 sec x 3 each  side Seated chin retraction - hold 5 sec x 10 Posterior shoulder roll - x 10 reps - slow and controlled.   Education provided throughout session via VC/TC and demonstration to facilitate movement at target joints and correct muscle activation for all testing and exercises performed.   Access Code: [email or web address] URL: [email or web address] Date: 05/05/2021 Prepared by: Sande Brothers  Exercises Supine Cervical Retraction with Towel - 1 x daily - 3 x weekly - 3 sets - 10 reps - 5 hold Seated Shoulder Rolls - 1 x daily - 3 x weekly - 3 sets - 10 reps Seated Assisted Cervical Rotation with Towel - 1 x daily - 7 x weekly - 4 sets - 20 hold Seated Cervical Sidebending Stretch - 1 x daily - 7 x weekly - 4 sets - 20 holdper motor neuron pattern weakness, suspected small vessel lacunar infarction.                      PT Education - 05/05/21 1124     Education Details Postural and cervical ROM exercise technique    Person(s) Educated Patient    Methods Explanation;Demonstration;Tactile cues;Verbal cues;Handout    Comprehension Verbalized understanding;Returned demonstration;Verbal cues required;Tactile cues required;Need further instruction              PT Short Term Goals - 04/26/21 1624       PT SHORT TERM GOAL #1   Title Patient will be independent in home exercise program to improve strength/mobility for better functional independence with ADLs.    Baseline 3/1: HEP given    Time 2    Period Weeks    Status New    Target Date 05/10/21               PT Long Term Goals - 04/26/21 1624       PT LONG TERM GOAL #1   Title Patient will increase FOTO score to equal to or greater than 62%    to demonstrate statistically significant improvement in mobility and quality of life.    Baseline 3/1: 59%    Time 6    Period Weeks    Status New    Target Date 06/07/21      PT LONG TERM GOAL #2   Title Patient will report a worst pain of 3/10 on VAS  in head, neck and R shoulder to improve tolerance with ADLs and reduced symptoms with activities.    Baseline 3/1: 9/10 pain in all    Time 6    Period Weeks    Status New    Target Date 06/07/21      PT LONG TERM GOAL #3   Title Patient will reduce Neck Disability Index score to <10% to demonstrate minimal  disability with ADLs including improved sleeping tolerance, sitting tolerance, etc for better mobility at home and work.    Baseline 3/1: 20%    Time 6    Period Weeks    Status New    Target Date 06/07/21      PT LONG TERM GOAL #4   Title Patient will improve AROM UE so they are able to perform overhead ADL's such as reaching into cabinets    Baseline 3/1: has increased pain reaching overhead    Time 6    Period Weeks    Status New    Target Date 06/07/21                   Plan - 05/05/21 1121     Clinical Impression Statement Patient presented today with excellent motivation and self reported pain well controlled at 3/10. He denied any worsening of pain and no increased numbness/tingling after session. He was able to improve his cervical AROM after manual session. He was also able to verbalize good understanding of HEP and return proper demonstration. No specific tenderness noted with with UPA/CPA mobs today. Patient will benefit from skilled physical therapy to decrease pain, improve posture, and improve ROM for return to PLOF    Personal Factors and Comorbidities Age;Comorbidity 3+;Profession;Past/Current Experience;Time since onset of injury/illness/exacerbation    Comorbidities ACDF C5-6,C6-7; HLD, migraine    Examination-Activity Limitations Carry;Locomotion Level;Lift;Reach Overhead    Examination-Participation Restrictions Cleaning;Driving;Meal Prep;Laundry;Occupation;Shop;Volunteer;Yard Work;Other    Stability/Clinical Decision Making Evolving/Moderate complexity    Rehab Potential Fair    PT Frequency 2x / week    PT Duration 6 weeks    PT  Treatment/Interventions ADLs/Self Care Home Management;Canalith Repostioning;Cryotherapy;Electrical Stimulation;Iontophoresis '4mg'$ /ml Dexamethasone;Moist Heat;Ultrasound;Traction;Functional mobility training;Therapeutic activities;Neuromuscular re-education;Balance training;Therapeutic exercise;Patient/family education;Manual techniques;Passive range of motion;Dry needling;Vestibular;Vasopneumatic Device;Taping;Splinting;Energy conservation;Visual/perceptual remediation/compensation;Spinal Manipulations;Joint Manipulations    PT Next Visit Plan nerve glides, mobilizations, postural strengthening exercises    PT Home Exercise Plan 05/05/2021= Access Code: [email or web address]  ?URL: [email or web address]    Consulted and Agree with Plan of Care Patient             Patient will benefit from skilled therapeutic intervention in order to improve the following deficits and impairments:  Decreased range of motion, Decreased strength, Hypomobility, Impaired flexibility, Increased muscle spasms, Impaired UE functional use, Improper body mechanics, Postural dysfunction, Pain  Visit Diagnosis: Cervicalgia  Chronic right shoulder pain  Abnormal posture     Problem List Patient Active Problem List   Diagnosis Date Noted   Anterolisthesis of cervical spine (C3-C4) 03/30/2021   Cervical radicular pain 03/07/2021   Cervicalgia 03/07/2021   Chronic pain syndrome 03/07/2021   Pain in both testicles 11/14/2020   Tension headache 07/11/2020   Seborrheic keratoses 12/03/2019   S/P cervical spinal fusion 11/13/2017   Cervical stenosis of spine 01/11/2016   Hx of adenomatous polyp of colon 05/19/2014   Low testosterone 05/06/2012   Fatigue 09/03/2011   HYPERCHOLESTEROLEMIA 06/21/2009   PES PLANUS 05/18/2009    Lewis Moccasin, PT 05/05/2021, 11:27 AM  [place] MAIN [facility] SERVICES [address] [place], [place], [address] Phone: [phone]   Fax:   [phone]  Name: Jeremiah Carpenter MRN: [id number] Date of Birth: 28-Jun-1965

## 2021-05-09 ENCOUNTER — Ambulatory Visit: Payer: BLUE CROSS/BLUE SHIELD | Admitting: Physical Therapy

## 2021-05-09 ENCOUNTER — Other Ambulatory Visit: Payer: Self-pay

## 2021-05-09 DIAGNOSIS — M542 Cervicalgia: Secondary | ICD-10-CM | POA: Diagnosis not present

## 2021-05-09 DIAGNOSIS — G8929 Other chronic pain: Secondary | ICD-10-CM

## 2021-05-09 DIAGNOSIS — M25511 Pain in right shoulder: Secondary | ICD-10-CM | POA: Diagnosis not present

## 2021-05-09 DIAGNOSIS — R293 Abnormal posture: Secondary | ICD-10-CM | POA: Diagnosis not present

## 2021-05-09 DIAGNOSIS — G894 Chronic pain syndrome: Secondary | ICD-10-CM | POA: Diagnosis not present

## 2021-05-09 NOTE — Therapy (Signed)
[place] ?[facility] ?[place], [place], [address] ?Phone: [phone]   Fax:  [phone] ? ?Physical Therapy Treatment ? ?Patient Details  ?Name: Jeremiah Carpenter ?MRN: [id number] ?Date of Birth: Oct 10, 1965 ?Referring Provider (PT): Gillis Santa ? ? ?Encounter Date: 05/09/2021 ? ? PT End of Session - 05/09/21 1551   ? ? Visit Number 4   ? Number of Visits 12   ? Date for PT Re-Evaluation 06/07/21   ? Authorization Type 2/10 eval 04/26/21   ? Progress Note Due on Visit 10   ? PT Start Time 1515   ? PT Stop Time 6010   ? PT Time Calculation (min) 40 min   ? Activity Tolerance Patient tolerated treatment well   ? Behavior During Therapy [facility] for tasks assessed/performed   ? ?  ?  ? ?  ? ? ?Past Medical History:  ?Diagnosis Date  ? History of colon polyps   ? benign  ? History of kidney stones   ? History of migraine   ? last one about a month ago  ? Hyperlipidemia   ? takes Atorvastatin daily  ? Joint pain   ? Weakness   ? numbness mainly left hand and rarely on right  ? ? ?Past Surgical History:  ?Procedure Laterality Date  ? ANTERIOR CERVICAL DECOMP/DISCECTOMY FUSION N/A 01/11/2016  ? Procedure: C5-6, C6-7 Anterior Cervical Discectomy and Fusion, Allograft, Plate;  Surgeon: Marybelle Killings, MD;  Location: [facility];  Service: Orthopedics;  Laterality: N/A;  ? CARPAL TUNNEL RELEASE Bilateral   ? COLONOSCOPY    ? COLONOSCOPY WITH PROPOFOL N/A 01/25/2020  ? Procedure: COLONOSCOPY WITH PROPOFOL;  Surgeon: Jonathon Bellows, MD;  Location[facility];  Service: Gastroenterology;  Laterality: N/A;  ? VASECTOMY    ? ? ?There were no vitals filed for this visit. ? ? Subjective Assessment - 05/09/21 1521   ? ? Subjective Patient reports he is doing well. Is having some soreness in his right shoulder blade and in the anterior aspect of his left shoulder.   ? Pertinent History Jeremiah Carpenter is a pleasant 56 year old male with a history of ACDF, two-level, C5-C6 and C6-C7 performed in  2017 who presents with a chief complaint of neck pain with radiation into right shoulder.   He has done chiropractic therapy as well as TENS unit. He has not done physical therapy in the last 2 years.  His pain is worse with lateral rotation. Pain began in 2019, has been going to [employer] and acupuncture. Pain worsens when he picks up objects/lifting. Patient works for an [employer]. PMH includes tension headaches, cervicalgia, HLD.   ? Limitations Lifting;Standing;House hold activities;Writing   ? How long can you sit comfortably? painful when reaching with hands   ? How long can you stand comfortably? n/a   ? How long can you walk comfortably? n/a   ? Diagnostic tests Degenerative changes above the level of fusion  including 3 mm of anterolisthesis at C3-C4 as described.   ? Patient Stated Goals to get his MRI   ? Currently in Pain? Yes   ? Pain Score 4    ? Pain Location Arm   ? Pain Orientation Right;Left   ? Pain Descriptors / Indicators --   ? Pain Onset More than a month ago   ? ?  ?  ? ?  ? ? ? ?INTETRVENTIONS:  ? ? ?Manual: ?Cervical side bend with overpressure at glenohumeral joint and  occipital region 3x30 second holds each direction (Increased tightness  ?Cervical rotation with overpressure at glenohumeral joint and occipital region 3x30 second holds ?STM to R UT in supine x several minutes  ?Cervical distraction stretch 2 x 60 seconds ?- pt reports improved s/s in his UE following this activity  ?Grade II UPA and CPA 5 seconds each level thoracic and cervical ? ?  ?TherEx ? ? ?Active shoulder horizontal abduction x 8 supine ?-ceased activity due to R UE numbness that worsened throughout despite changing shoulder orientation ? ?Supine scapular retraction 10 x 5 sec holds  ?- UE tingling on the right side remained the same following this  ? ?Shoulder horizontal abduction 1 x 10 YTB in seated ?-cues for maintenance of neutral posture throughout.  ?-around rep 6 pt reports worsening of  numbness in hands bilaterally but "different" sensation compared to supine performance.  ? ? ? ? ? ? ? ? ? ? ? ? ? ? ? ? ? ? ? ? ? ? ? ? ? PT Education - 05/09/21 1628   ? ? Education Details Exercise form and technique   ? Person(s) Educated Patient   ? Methods Explanation   ? Comprehension Verbalized understanding   ? ?  ?  ? ?  ? ? ? PT Short Term Goals - 04/26/21 1624   ? ?  ? PT SHORT TERM GOAL #1  ? Title Patient will be independent in home exercise program to improve strength/mobility for better functional independence with ADLs.   ? Baseline 3/1: HEP given   ? Time 2   ? Period Weeks   ? Status New   ? Target Date 05/10/21   ? ?  ?  ? ?  ? ? ? ? PT Long Term Goals - 04/26/21 1624   ? ?  ? PT LONG TERM GOAL #1  ? Title Patient will increase FOTO score to equal to or greater than 62%    to demonstrate statistically significant improvement in mobility and quality of life.   ? Baseline 3/1: 59%   ? Time 6   ? Period Weeks   ? Status New   ? Target Date 06/07/21   ?  ? PT LONG TERM GOAL #2  ? Title Patient will report a worst pain of 3/10 on VAS in head, neck and R shoulder to improve tolerance with ADLs and reduced symptoms with activities.   ? Baseline 3/1: 9/10 pain in all   ? Time 6   ? Period Weeks   ? Status New   ? Target Date 06/07/21   ?  ? PT LONG TERM GOAL #3  ? Title Patient will reduce Neck Disability Index score to <10% to demonstrate minimal disability with ADL?s including improved sleeping tolerance, sitting tolerance, etc for better mobility at home and work.   ? Baseline 3/1: 20%   ? Time 6   ? Period Weeks   ? Status New   ? Target Date 06/07/21   ?  ? PT LONG TERM GOAL #4  ? Title Patient will improve AROM UE so they are able to perform overhead ADL's such as reaching into cabinets   ? Baseline 3/1: has increased pain reaching overhead   ? Time 6   ? Period Weeks   ? Status New   ? Target Date 06/07/21   ? ?  ?  ? ?  ? ? ? ? ? ? ? ? Plan - 05/09/21 1628   ? ?  Clinical Impression Statement  Patient demonstrates excellent motivation for completion of physical therapy program.  Patient did have some increase in numbness and tingling during session with attempts to improve lower and mid trap muscle activation and strengthening.  This increase in numbness was alleviated with cessation of the task.  Patient verbalized good understanding of HEP and has no questions regarding it and reports it has been improving his signs and symptoms.  Continued with improving tissue extensibility and targeting muscle trigger points in upper traps as well as improving spinal mobility during today's session.  Patient will continue to benefit from skilled physical therapy intervention in order to improve pain, posture, range of motion, and return to his prior level of function.   ? Personal Factors and Comorbidities Age;Comorbidity 3+;Profession;Past/Current Experience;Time since onset of injury/illness/exacerbation   ? Comorbidities ACDF C5-6,C6-7; HLD, migraine   ? Examination-Activity Limitations Carry;Locomotion Level;Lift;Reach Overhead   ? Examination-Participation Restrictions Cleaning;Driving;Meal Prep;Laundry;Occupation;Shop;Volunteer;Yard Work;Other   ? Stability/Clinical Decision Making Evolving/Moderate complexity   ? Rehab Potential Fair   ? PT Frequency 2x / week   ? PT Duration 6 weeks   ? PT Treatment/Interventions ADLs/Self Care Home Management;Canalith Repostioning;Cryotherapy;Electrical Stimulation;Iontophoresis '4mg'$ /ml Dexamethasone;Moist Heat;Ultrasound;Traction;Functional mobility training;Therapeutic activities;Neuromuscular re-education;Balance training;Therapeutic exercise;Patient/family education;Manual techniques;Passive range of motion;Dry needling;Vestibular;Vasopneumatic Device;Taping;Splinting;Energy conservation;Visual/perceptual remediation/compensation;Spinal Manipulations;Joint Manipulations   ? PT Next Visit Plan nerve glides, mobilizations, postural strengthening exercises   ? PT Home  Exercise Plan 05/05/2021= Access Code: [email or web address]  ?URL: [email or web address]   ? Consulted and Agree with Plan of Care Patient   ? ?  ?  ? ?  ? ? ?Patient will benefit from skilled therapeutic interven

## 2021-05-11 ENCOUNTER — Ambulatory Visit: Payer: BLUE CROSS/BLUE SHIELD

## 2021-05-11 ENCOUNTER — Other Ambulatory Visit: Payer: Self-pay

## 2021-05-11 DIAGNOSIS — M542 Cervicalgia: Secondary | ICD-10-CM | POA: Diagnosis not present

## 2021-05-11 DIAGNOSIS — R293 Abnormal posture: Secondary | ICD-10-CM | POA: Diagnosis not present

## 2021-05-11 DIAGNOSIS — G8929 Other chronic pain: Secondary | ICD-10-CM | POA: Diagnosis not present

## 2021-05-11 DIAGNOSIS — M25511 Pain in right shoulder: Secondary | ICD-10-CM | POA: Diagnosis not present

## 2021-05-11 DIAGNOSIS — G894 Chronic pain syndrome: Secondary | ICD-10-CM | POA: Diagnosis not present

## 2021-05-11 NOTE — Therapy (Signed)
[place] ?[facility] ?[place], [place], [address] ?Phone: [phone]   Fax:  [phone] ? ?Physical Therapy Treatment ? ?Patient Details  ?Name: Jeremiah Carpenter ?MRN: [id number] ?Date of Birth: 1965-05-05 ?Referring Provider (PT): Gillis Santa ? ? ?Encounter Date: 05/11/2021 ? ? PT End of Session - 05/11/21 1531   ? ? Visit Number 5   ? Number of Visits 12   ? Date for PT Re-Evaluation 06/07/21   ? Authorization Type 5/10 eval 04/26/21   ? Progress Note Due on Visit 10   ? PT Start Time 1345   ? PT Stop Time 6734   ? PT Time Calculation (min) 44 min   ? Activity Tolerance Patient tolerated treatment well   ? Behavior During Therapy [facility] for tasks assessed/performed   ? ?  ?  ? ?  ? ? ?Past Medical History:  ?Diagnosis Date  ? History of colon polyps   ? benign  ? History of kidney stones   ? History of migraine   ? last one about a month ago  ? Hyperlipidemia   ? takes Atorvastatin daily  ? Joint pain   ? Weakness   ? numbness mainly left hand and rarely on right  ? ? ?Past Surgical History:  ?Procedure Laterality Date  ? ANTERIOR CERVICAL DECOMP/DISCECTOMY FUSION N/A 01/11/2016  ? Procedure: C5-6, C6-7 Anterior Cervical Discectomy and Fusion, Allograft, Plate;  Surgeon: Marybelle Killings, MD;  Location: [facility];  Service: Orthopedics;  Laterality: N/A;  ? CARPAL TUNNEL RELEASE Bilateral   ? COLONOSCOPY    ? COLONOSCOPY WITH PROPOFOL N/A 01/25/2020  ? Procedure: COLONOSCOPY WITH PROPOFOL;  Surgeon: Jonathon Bellows, MD;  Location[facility] ENDOSCOPY;  Service: Gastroenterology;  Laterality: N/A;  ? VASECTOMY    ? ? ?There were no vitals filed for this visit. ? ? Subjective Assessment - 05/11/21 1530   ? ? Subjective Patient reports he is having increased numbness.   ? Pertinent History [name] is a pleasant 56 year old male with a history of ACDF, two-level, C5-C6 and C6-C7 performed in 2017 who presents with a chief complaint of neck pain with radiation into right  shoulder.   He has done chiropractic therapy as well as TENS unit. He has not done physical therapy in the last 2 years.  His pain is worse with lateral rotation. Pain began in 2019, has been going to [employer] and acupuncture. Pain worsens when he picks up objects/lifting. Patient works for an [employer] fund[employer]. PMH includes tension headaches, cervicalgia, HLD.   ? Limitations Lifting;Standing;House hold activities;Writing   ? How long can you sit comfortably? painful when reaching with hands   ? How long can you stand comfortably? n/a   ? How long can you walk comfortably? n/a   ? Diagnostic tests Degenerative changes above the level of fusion  including 3 mm of anterolisthesis at C3-C4 as described.   ? Patient Stated Goals to get his MRI   ? Currently in Pain? Yes   ? Pain Score 4    ? Pain Location Neck   ? Pain Orientation Right;Left   ? Pain Descriptors / Indicators Aching;Throbbing;Grimacing;Guarding   ? Pain Type Neuropathic pain   ? Pain Radiating Towards bilateral arms   ? Pain Onset More than a month ago   ? Pain Frequency Intermittent   ? ?  ?  ? ?  ? ? ? ? ? ? ? ? ?  ?  ?Manual: ?  Cervical side bent with overpressure at glenohumeral joint and occipital region 3x30 second holds each direction ?Cervical rotation with overpressure at glenohumeral joint and occipital region 3x30 second holds ?Suboccipital release 3x30 second holds  ?Grade II UPA and CPA 5 seconds each level thoracic and cervical; C4-5 UPA reduced radiating pain ? ?  ?TherEx ?Scapular punches 10x each UE ?Towel distraction with cervical extension 10x 5 second holds ?Scapular depression with lateral rotation cervical hold 5x10 second holds ?Scapular retraction with overhead RTB raise in Y position 10x ?RTB ER 15x ?  ?Trigger Point Dry Needling (TDN), unbilled ?Education performed with patient regarding potential benefit of TDN. Reviewed precautions and risks with patient. Reviewed special precautions/risks over lung fields which  include pneumothorax. Reviewed signs and symptoms of pneumothorax and advised pt to go to ER immediately if these symptoms develop advise them of dry needling treatment. Extensive time spent with pt to ensure full understanding of TDN risks. Pt provided verbal consent to treatment. TDN performed to  with 0.3 x 30 single needle placements with local twitch response (LTR). Pistoning technique utilized. Improved pain-free motion following intervention. Muscles targeted bilateral cervical paraspinals, suboccipitals, and upper traps x 7 minutes ?  ? ? ?Patient has significant bilateral trigger point releases with TDN with R>L. Grade II UPA to C4-5 reduced radiating pain. Patient educated on postural alignment s/p TDN for optimal results and importance of posture between sessions. Patient will continue to benefit from skilled physical therapy intervention in order to improve pain, posture, range of motion, and return to his prior level of function ? ? ? ? ? ? ? ? ? ? ? ? ? ? ? ? ? PT Education - 05/11/21 1531   ? ? Education Details exercise technique, body mechanics, TDN   ? Person(s) Educated Patient   ? Methods Explanation;Demonstration;Tactile cues;Verbal cues   ? Comprehension Verbalized understanding;Returned demonstration;Verbal cues required;Tactile cues required   ? ?  ?  ? ?  ? ? ? PT Short Term Goals - 04/26/21 1624   ? ?  ? PT SHORT TERM GOAL #1  ? Title Patient will be independent in home exercise program to improve strength/mobility for better functional independence with ADLs.   ? Baseline 3/1: HEP given   ? Time 2   ? Period Weeks   ? Status New   ? Target Date 05/10/21   ? ?  ?  ? ?  ? ? ? ? PT Long Term Goals - 04/26/21 1624   ? ?  ? PT LONG TERM GOAL #1  ? Title Patient will increase FOTO score to equal to or greater than 62%    to demonstrate statistically significant improvement in mobility and quality of life.   ? Baseline 3/1: 59%   ? Time 6   ? Period Weeks   ? Status New   ? Target Date 06/07/21    ?  ? PT LONG TERM GOAL #2  ? Title Patient will report a worst pain of 3/10 on VAS in head, neck and R shoulder to improve tolerance with ADLs and reduced symptoms with activities.   ? Baseline 3/1: 9/10 pain in all   ? Time 6   ? Period Weeks   ? Status New   ? Target Date 06/07/21   ?  ? PT LONG TERM GOAL #3  ? Title Patient will reduce Neck Disability Index score to <10% to demonstrate minimal disability with ADL?s including improved sleeping tolerance, sitting tolerance, etc for  better mobility at home and work.   ? Baseline 3/1: 20%   ? Time 6   ? Period Weeks   ? Status New   ? Target Date 06/07/21   ?  ? PT LONG TERM GOAL #4  ? Title Patient will improve AROM UE so they are able to perform overhead ADL's such as reaching into cabinets   ? Baseline 3/1: has increased pain reaching overhead   ? Time 6   ? Period Weeks   ? Status New   ? Target Date 06/07/21   ? ?  ?  ? ?  ? ? ? ? ? ? ? ? Plan - 05/11/21 1535   ? ? Clinical Impression Statement Patient has significant bilateral trigger point releases with TDN with R>L. Grade II UPA to C4-5 reduced radiating pain. Patient educated on postural alignment s/p TDN for optimal results and importance of posture between sessions. Patient will continue to benefit from skilled physical therapy intervention in order to improve pain, posture, range of motion, and return to his prior level of function   ? Personal Factors and Comorbidities Age;Comorbidity 3+;Profession;Past/Current Experience;Time since onset of injury/illness/exacerbation   ? Comorbidities ACDF C5-6,C6-7; HLD, migraine   ? Examination-Activity Limitations Carry;Locomotion Level;Lift;Reach Overhead   ? Examination-Participation Restrictions Cleaning;Driving;Meal Prep;Laundry;Occupation;Shop;Volunteer;Yard Work;Other   ? Stability/Clinical Decision Making Evolving/Moderate complexity   ? Rehab Potential Fair   ? PT Frequency 2x / week   ? PT Duration 6 weeks   ? PT Treatment/Interventions ADLs/Self Care Home  Management;Canalith Repostioning;Cryotherapy;Electrical Stimulation;Iontophoresis '4mg'$ /ml Dexamethasone;Moist Heat;Ultrasound;Traction;Functional mobility training;Therapeutic activities;Neuromuscular re-educat

## 2021-05-15 ENCOUNTER — Ambulatory Visit: Payer: BLUE CROSS/BLUE SHIELD | Admitting: Physical Therapy

## 2021-05-17 ENCOUNTER — Ambulatory Visit: Payer: BLUE CROSS/BLUE SHIELD | Admitting: Physical Therapy

## 2021-05-22 ENCOUNTER — Ambulatory Visit: Payer: BLUE CROSS/BLUE SHIELD | Admitting: Physical Therapy

## 2021-05-22 ENCOUNTER — Other Ambulatory Visit: Payer: Self-pay

## 2021-05-22 DIAGNOSIS — M542 Cervicalgia: Secondary | ICD-10-CM | POA: Diagnosis not present

## 2021-05-22 DIAGNOSIS — R293 Abnormal posture: Secondary | ICD-10-CM

## 2021-05-22 DIAGNOSIS — G8929 Other chronic pain: Secondary | ICD-10-CM | POA: Diagnosis not present

## 2021-05-22 DIAGNOSIS — M25511 Pain in right shoulder: Secondary | ICD-10-CM | POA: Diagnosis not present

## 2021-05-22 DIAGNOSIS — G894 Chronic pain syndrome: Secondary | ICD-10-CM | POA: Diagnosis not present

## 2021-05-22 NOTE — Therapy (Signed)
[facility] ?[place], [place], [address] ?Phone: [phone]   Fax:  [phone] ? ?Physical Therapy Treatment ? ?Patient Details  ?Name: Jeremiah Carpenter ?MRN: [id number] ?Date of Birth: 02-20-1966 ?Referring Provider (PT): Gillis Santa ? ? ?Encounter Date: 05/22/2021 ? ? PT End of Session - 05/22/21 1150   ? ? Visit Number 6   ? Number of Visits 12   ? Date for PT Re-Evaluation 06/07/21   ? Authorization Type 5/10 eval 04/26/21   ? Progress Note Due on Visit 10   ? PT Start Time [phone]   ? PT Stop Time 0930   ? PT Time Calculation (min) 43 min   ? Equipment Utilized During Treatment Gait belt   ? Activity Tolerance Patient tolerated treatment well   ? Behavior During Therapy [facility] for tasks assessed/performed   ? ?  ?  ? ?  ? ? ?Past Medical History:  ?Diagnosis Date  ? History of colon polyps   ? benign  ? History of kidney stones   ? History of migraine   ? last one about a month ago  ? Hyperlipidemia   ? takes Atorvastatin daily  ? Joint pain   ? Weakness   ? numbness mainly left hand and rarely on right  ? ? ?Past Surgical History:  ?Procedure Laterality Date  ? ANTERIOR CERVICAL DECOMP/DISCECTOMY FUSION N/A 01/11/2016  ? Procedure: C5-6, C6-7 Anterior Cervical Discectomy and Fusion, Allograft, Plate;  Surgeon: Marybelle Killings, MD;  Location: [facility];  Service: Orthopedics;  Laterality: N/A;  ? CARPAL TUNNEL RELEASE Bilateral   ? COLONOSCOPY    ? COLONOSCOPY WITH PROPOFOL N/A 01/25/2020  ? Procedure: COLONOSCOPY WITH PROPOFOL;  Surgeon: Jonathon Bellows, MD;  Location[facility];  Service: Gastroenterology;  Laterality: N/A;  ? VASECTOMY    ? ? ?There were no vitals filed for this visit. ? ? Subjective Assessment - 05/22/21 1149   ? ? Subjective Having severe migraine headache since last appointment but is improved at this time.  Patient reports improvement in tightness in his muscles but continues to report pain.  Patient reports he may seek MRI for  further evaluation of his pain and discomfort.   ? Pertinent History Jeremiah Carpenter is a pleasant 56 year old male with a history of ACDF, two-level, C5-C6 and C6-C7 performed in 2017 who presents with a chief complaint of neck pain with radiation into right shoulder.   He has done chiropractic therapy as well as TENS unit. He has not done physical therapy in the last 2 years.  His pain is worse with lateral rotation. Pain began in 2019, has been going to [employer] and acupuncture. Pain worsens when he picks up objects/lifting. Patient works for an [employer]. PMH includes tension headaches, cervicalgia, HLD.   ? Limitations Lifting;Standing;House hold activities;Writing   ? How long can you sit comfortably? painful when reaching with hands   ? How long can you stand comfortably? n/a   ? How long can you walk comfortably? n/a   ? Diagnostic tests Degenerative changes above the level of fusion  including 3 mm of anterolisthesis at C3-C4 as described.   ? Patient Stated Goals to get his MRI   ? Pain Onset More than a month ago   ? ?  ?  ? ?  ? ? ? ?  ?Manual: ?Cervical side bent with overpressure at glenohumeral joint and occipital region 3x30 second holds each direction ?Cervical rotation  with overpressure at glenohumeral joint and occipital region 3x30 second holds ?Suboccipital release 3x30 second holds  ?Grade II UPA and CPA 5 seconds each level thoracic and cervical to C7;  ?  ?TherEx ?Scapular punches with wand 10 x 5 sec hold bilateral  ?Towel distraction with cervical extension 10x 5 second holds ?Chin tucks with head rotation in supine 10 x ea side with 2 second holds  ?Scapular retraction with overhead RTB raise in Y position 10x ea  ? ?No pain with any exercise performed this session. Some increase in soreness noted.  ? ?  ?Trigger Point Dry Needling (TDN), unbilled, performed by Janna Arch, PT, DPT  ?Trigger Point Dry Needling (TDN), unbilled Education performed with patient regarding potential  benefit of TDN. Reviewed precautions and risks with patient. Reviewed special precautions/risks over lung fields which include pneumothorax. Reviewed signs and symptoms of pneumothorax and advised pt to go to ER immediately if these symptoms develop advise them of dry needling treatment. Extensive time spent with pt to ensure full understanding of TDN risks. Pt provided verbal consent to treatment. TDN performed to  with 0.3 x 30 single needle placements with local twitch response (LTR). Pistoning technique utilized. Improved pain-free motion following intervention. Muscles targeted bilateral cervical paraspinals, suboccipitals, and upper traps x 5 minutes  ? ? ? ? ? ? ? ? ? ? ? ? ? ? ? ? ? ? ? ? ? ? ? ? ? ? ? ? ? PT Education - 05/22/21 1150   ? ? Education Details Exercise technique, body mechanics   ? Person(s) Educated Patient   ? Methods Explanation;Demonstration   ? Comprehension Verbalized understanding;Returned demonstration   ? ?  ?  ? ?  ? ? ? PT Short Term Goals - 04/26/21 1624   ? ?  ? PT SHORT TERM GOAL #1  ? Title Patient will be independent in home exercise program to improve strength/mobility for better functional independence with ADLs.   ? Baseline 3/1: HEP given   ? Time 2   ? Period Weeks   ? Status New   ? Target Date 05/10/21   ? ?  ?  ? ?  ? ? ? ? PT Long Term Goals - 04/26/21 1624   ? ?  ? PT LONG TERM GOAL #1  ? Title Patient will increase FOTO score to equal to or greater than 62%    to demonstrate statistically significant improvement in mobility and quality of life.   ? Baseline 3/1: 59%   ? Time 6   ? Period Weeks   ? Status New   ? Target Date 06/07/21   ?  ? PT LONG TERM GOAL #2  ? Title Patient will report a worst pain of 3/10 on VAS in head, neck and R shoulder to improve tolerance with ADLs and reduced symptoms with activities.   ? Baseline 3/1: 9/10 pain in all   ? Time 6   ? Period Weeks   ? Status New   ? Target Date 06/07/21   ?  ? PT LONG TERM GOAL #3  ? Title Patient will  reduce Neck Disability Index score to <10% to demonstrate minimal disability with ADL?s including improved sleeping tolerance, sitting tolerance, etc for better mobility at home and work.   ? Baseline 3/1: 20%   ? Time 6   ? Period Weeks   ? Status New   ? Target Date 06/07/21   ?  ? PT LONG TERM  GOAL #4  ? Title Patient will improve AROM UE so they are able to perform overhead ADL's such as reaching into cabinets   ? Baseline 3/1: has increased pain reaching overhead   ? Time 6   ? Period Weeks   ? Status New   ? Target Date 06/07/21   ? ?  ?  ? ?  ? ? ? ? ? ? ? ? Plan - 05/22/21 1151   ? ? Clinical Impression Statement Patient demonstrates excellent motivation for completion of physical therapy program.  Patient treated with trigger point dry needling performed by physical therapist certified for dry needling.  No charge associated with this treatment this date patient did report significant improvement in tightness and minimal soreness following.  Patient completed several manual stretches as well as shoulder and postural muscle activation exercises and tolerated well.  Patient reports no increased pain and no numbness at end of session or during session.  Patient will continue to benefit from skilled physical therapy intervention in order to improve his pain, posture, range of motion, in order to allow him to return to his prior level of function and activities.   ? Personal Factors and Comorbidities Age;Comorbidity 3+;Profession;Past/Current Experience;Time since onset of injury/illness/exacerbation   ? Comorbidities ACDF C5-6,C6-7; HLD, migraine   ? Examination-Activity Limitations Carry;Locomotion Level;Lift;Reach Overhead   ? Examination-Participation Restrictions Cleaning;Driving;Meal Prep;Laundry;Occupation;Shop;Volunteer;Yard Work;Other   ? Stability/Clinical Decision Making Evolving/Moderate complexity   ? Rehab Potential Fair   ? PT Frequency 2x / week   ? PT Duration 6 weeks   ? PT  Treatment/Interventions ADLs/Self Care Home Management;Canalith Repostioning;Cryotherapy;Electrical Stimulation;Iontophoresis '4mg'$ /ml Dexamethasone;Moist Heat;Ultrasound;Traction;Functional mobility training;Therapeutic Marya Fossa

## 2021-05-24 ENCOUNTER — Ambulatory Visit: Payer: BLUE CROSS/BLUE SHIELD

## 2021-05-24 ENCOUNTER — Other Ambulatory Visit: Payer: Self-pay

## 2021-05-24 DIAGNOSIS — G8929 Other chronic pain: Secondary | ICD-10-CM

## 2021-05-24 DIAGNOSIS — G894 Chronic pain syndrome: Secondary | ICD-10-CM | POA: Diagnosis not present

## 2021-05-24 DIAGNOSIS — M542 Cervicalgia: Secondary | ICD-10-CM

## 2021-05-24 DIAGNOSIS — R293 Abnormal posture: Secondary | ICD-10-CM | POA: Diagnosis not present

## 2021-05-24 DIAGNOSIS — M25511 Pain in right shoulder: Secondary | ICD-10-CM | POA: Diagnosis not present

## 2021-05-24 NOTE — Therapy (Signed)
[facility] ?[place], [place], [address] ?Phone: [phone]   Fax:  [phone] ? ?Physical Therapy Treatment ? ?Patient Details  ?Name: Jeremiah Carpenter ?MRN: [id number] ?Date of Birth: 06/22/1965 ?Referring Provider (PT): Gillis Santa ? ? ?Encounter Date: 05/24/2021 ? ? PT End of Session - 05/24/21 0932   ? ? Visit Number 7   ? Number of Visits 12   ? Date for PT Re-Evaluation 06/07/21   ? Authorization Type 7/10 eval 04/26/21   ? Progress Note Due on Visit 10   ? PT Start Time [phone]   ? PT Stop Time 0930   ? PT Time Calculation (min) 44 min   ? Equipment Utilized During Treatment Gait belt   ? Activity Tolerance Patient tolerated treatment well   ? Behavior During Therapy [facility] for tasks assessed/performed   ? ?  ?  ? ?  ? ? ?Past Medical History:  ?Diagnosis Date  ? History of colon polyps   ? benign  ? History of kidney stones   ? History of migraine   ? last one about a month ago  ? Hyperlipidemia   ? takes Atorvastatin daily  ? Joint pain   ? Weakness   ? numbness mainly left hand and rarely on right  ? ? ?Past Surgical History:  ?Procedure Laterality Date  ? ANTERIOR CERVICAL DECOMP/DISCECTOMY FUSION N/A 01/11/2016  ? Procedure: C5-6, C6-7 Anterior Cervical Discectomy and Fusion, Allograft, Plate;  Surgeon: Marybelle Killings, MD;  Location: [facility];  Service: Orthopedics;  Laterality: N/A;  ? CARPAL TUNNEL RELEASE Bilateral   ? COLONOSCOPY    ? COLONOSCOPY WITH PROPOFOL N/A 01/25/2020  ? Procedure: COLONOSCOPY WITH PROPOFOL;  Surgeon: Jonathon Bellows, MD;  Location: [facility] ENDOSCOPY;  Service: Gastroenterology;  Laterality: N/A;  ? VASECTOMY    ? ? ?There were no vitals filed for this visit. ? ? Subjective Assessment - 05/24/21 0849   ? ? Subjective Patient reports he has been having a lot of pain. Coming from work. Has been having increased nerve symptoms down arm.   ? Pertinent History Jeremiah Carpenter is a pleasant 56 year old male with a history of ACDF,  two-level, C5-C6 and C6-C7 performed in 2017 who presents with a chief complaint of neck pain with radiation into right shoulder.   He has done chiropractic therapy as well as TENS unit. He has not done physical therapy in the last 2 years.  His pain is worse with lateral rotation. Pain began in 2019, has been going to [employer] and acupuncture. Pain worsens when he picks up objects/lifting. Patient works for an [employer] fund[employer]. PMH includes tension headaches, cervicalgia, HLD.   ? Limitations Lifting;Standing;House hold activities;Writing   ? How long can you sit comfortably? painful when reaching with hands   ? How long can you stand comfortably? n/a   ? How long can you walk comfortably? n/a   ? Diagnostic tests Degenerative changes above the level of fusion  including 3 mm of anterolisthesis at C3-C4 as described.   ? Patient Stated Goals to get his MRI   ? Currently in Pain? Yes   ? Pain Score 7    ? Pain Location Neck   ? Pain Orientation Right;Left   ? Pain Descriptors / Indicators Aching;Stabbing   ? Pain Type Neuropathic pain;Chronic pain   ? Pain Onset More than a month ago   ? ?  ?  ? ?  ? ? ? ? ? ? ?  ?  ?  Manual: ?Cervical side bent with overpressure at glenohumeral joint and occipital region 3x30 second holds each direction ?Cervical rotation with overpressure at glenohumeral joint and occipital region 3x30 second holds ?Suboccipital release 3x30 second holds  ?Grade II UPA and CPA 5 seconds each level thoracic and cervical; C4-5 UPA reduced radiating pain ?  ?  ?TherEx ?Scapular depression with lateral rotation cervical hold 5x10 second holds ?Bicep lengthening stretch on desk 30 seconds ?On half foam roller: ?-Scapular retraction with overhead RTB raise in Y position 10x ?-RTB ER 10x ?-robber stretch 20 seconds  ? ?On green swiss ball: I's, T's, Y's 10x each position  ?  ?Trigger Point Dry Needling (TDN), unbilled ?Education performed with patient regarding potential benefit of TDN.  Reviewed precautions and risks with patient. Reviewed special precautions/risks over lung fields which include pneumothorax. Reviewed signs and symptoms of pneumothorax and advised pt to go to ER immediately if these symptoms develop advise them of dry needling treatment. Extensive time spent with pt to ensure full understanding of TDN risks. Pt provided verbal consent to treatment. TDN performed to  with 0.3 x 30 single needle placements with local twitch response (LTR). Pistoning technique utilized. Improved pain-free motion following intervention. Muscles targeted L bicep, L wrist extensors and flexors, bilateral upper traps x 7 minutes ?  ? ? ?Patient tolerated progressive strengthening of scapular musculature as well as posture musculature. Multiple trigger points of upper traps released bilaterally. Radiating pain of L arm reduced with focused trigger points of bicep and forearm. Bicep lengthening stretch performed with patient tolerating well and added to HEP. Patient will continue to benefit from skilled physical therapy intervention in order to improve his pain, posture, range of motion, in order to allow him to return to his prior level of function and activities. ? ? ? ? ? ? ? ? ? ? ? ? ? ? ? ? ? ? ? PT Education - 05/24/21 0924   ? ? Education Details exercise technique, body mechanics   ? Person(s) Educated Patient   ? Methods Explanation;Demonstration;Tactile cues;Verbal cues   ? Comprehension Verbalized understanding;Returned demonstration;Verbal cues required;Tactile cues required;Need further instruction   ? ?  ?  ? ?  ? ? ? PT Short Term Goals - 04/26/21 1624   ? ?  ? PT SHORT TERM GOAL #1  ? Title Patient will be independent in home exercise program to improve strength/mobility for better functional independence with ADLs.   ? Baseline 3/1: HEP given   ? Time 2   ? Period Weeks   ? Status New   ? Target Date 05/10/21   ? ?  ?  ? ?  ? ? ? ? PT Long Term Goals - 04/26/21 1624   ? ?  ? PT LONG TERM  GOAL #1  ? Title Patient will increase FOTO score to equal to or greater than 62%    to demonstrate statistically significant improvement in mobility and quality of life.   ? Baseline 3/1: 59%   ? Time 6   ? Period Weeks   ? Status New   ? Target Date 06/07/21   ?  ? PT LONG TERM GOAL #2  ? Title Patient will report a worst pain of 3/10 on VAS in head, neck and R shoulder to improve tolerance with ADLs and reduced symptoms with activities.   ? Baseline 3/1: 9/10 pain in all   ? Time 6   ? Period Weeks   ? Status New   ?  Target Date 06/07/21   ?  ? PT LONG TERM GOAL #3  ? Title Patient will reduce Neck Disability Index score to <10% to demonstrate minimal disability with ADL?s including improved sleeping tolerance, sitting tolerance, etc for better mobility at home and work.   ? Baseline 3/1: 20%   ? Time 6   ? Period Weeks   ? Status New   ? Target Date 06/07/21   ?  ? PT LONG TERM GOAL #4  ? Title Patient will improve AROM UE so they are able to perform overhead ADL's such as reaching into cabinets   ? Baseline 3/1: has increased pain reaching overhead   ? Time 6   ? Period Weeks   ? Status New   ? Target Date 06/07/21   ? ?  ?  ? ?  ? ? ? ? ? ? ? ? Plan - 05/24/21 0936   ? ? Clinical Impression Statement Patient tolerated progressive strengthening of scapular musculature as well as posture musculature. Multiple trigger points of upper traps released bilaterally. Radiating pain of L arm reduced with focused trigger points of bicep and forearm. Bicep lengthening stretch performed with patient tolerating well and added to HEP. Patient will continue to benefit from skilled physical therapy intervention in order to improve his pain, posture, range of motion, in order to allow him to return to his prior level of function and activities.   ? Personal Factors and Comorbidities Age;Comorbidity 3+;Profession;Past/Current Experience;Time since onset of injury/illness/exacerbation   ? Comorbidities ACDF C5-6,C6-7; HLD,  migraine   ? Examination-Activity Limitations Carry;Locomotion Level;Lift;Reach Overhead   ? Examination-Participation Restrictions Cleaning;Driving;Meal Prep;Laundry;Occupation;Shop;Volunteer;Yard Work;Other   ? [place]

## 2021-05-29 ENCOUNTER — Ambulatory Visit: Payer: BLUE CROSS/BLUE SHIELD | Attending: Student in an Organized Health Care Education/Training Program

## 2021-05-29 DIAGNOSIS — M542 Cervicalgia: Secondary | ICD-10-CM | POA: Insufficient documentation

## 2021-05-29 DIAGNOSIS — G8929 Other chronic pain: Secondary | ICD-10-CM | POA: Insufficient documentation

## 2021-05-29 DIAGNOSIS — R293 Abnormal posture: Secondary | ICD-10-CM | POA: Insufficient documentation

## 2021-05-29 DIAGNOSIS — M25511 Pain in right shoulder: Secondary | ICD-10-CM | POA: Diagnosis not present

## 2021-05-29 NOTE — Therapy (Signed)
[facility] ?[place], [place], [address] ?Phone: [phone]   Fax:  [phone] ? ?Physical Therapy Treatment ? ?Patient Details  ?Name: Jeremiah Carpenter ?MRN: [id number] ?Date of Birth: 01-28-1966 ?Referring Provider (PT): Gillis Santa ? ? ?Encounter Date: 05/29/2021 ? ? PT End of Session - 05/29/21 0909   ? ? Visit Number 8   ? Number of Visits 12   ? Date for PT Re-Evaluation 06/07/21   ? Authorization Type 8/10 eval 04/26/21   ? Progress Note Due on Visit 10   ? PT Start Time [phone]   ? PT Stop Time 0929   ? PT Time Calculation (min) 43 min   ? Equipment Utilized During Treatment Gait belt   ? Activity Tolerance Patient tolerated treatment well   ? Behavior During Therapy [facility] for tasks assessed/performed   ? ?  ?  ? ?  ? ? ?Past Medical History:  ?Diagnosis Date  ? History of colon polyps   ? benign  ? History of kidney stones   ? History of migraine   ? last one about a month ago  ? Hyperlipidemia   ? takes Atorvastatin daily  ? Joint pain   ? Weakness   ? numbness mainly left hand and rarely on right  ? ? ?Past Surgical History:  ?Procedure Laterality Date  ? ANTERIOR CERVICAL DECOMP/DISCECTOMY FUSION N/A 01/11/2016  ? Procedure: C5-6, C6-7 Anterior Cervical Discectomy and Fusion, Allograft, Plate;  Surgeon: Marybelle Killings, MD;  Location: [facility];  Service: Orthopedics;  Laterality: N/A;  ? CARPAL TUNNEL RELEASE Bilateral   ? COLONOSCOPY    ? COLONOSCOPY WITH PROPOFOL N/A 01/25/2020  ? Procedure: COLONOSCOPY WITH PROPOFOL;  Surgeon: Jonathon Bellows, MD;  Location[facility] ENDOSCOPY;  Service: Gastroenterology;  Laterality: N/A;  ? VASECTOMY    ? ? ?There were no vitals filed for this visit. ? ? Subjective Assessment - 05/29/21 0850   ? ? Subjective Patient did some mowing with his tractor. Had a more relaxed weekend. Had good response to dry needling last session, does have bruising.   ? Pertinent History [name] is a pleasant 56 year old male with a  history of ACDF, two-level, C5-C6 and C6-C7 performed in 2017 who presents with a chief complaint of neck pain with radiation into right shoulder.   He has done chiropractic therapy as well as TENS unit. He has not done physical therapy in the last 2 years.  His pain is worse with lateral rotation. Pain began in 2019, has been going to [facility] and acupuncture. Pain worsens when he picks up objects/lifting. Patient works for an [employer]. PMH includes tension headaches, cervicalgia, HLD.   ? Limitations Lifting;Standing;House hold activities;Writing   ? How long can you sit comfortably? painful when reaching with hands   ? How long can you stand comfortably? n/a   ? How long can you walk comfortably? n/a   ? Diagnostic tests Degenerative changes above the level of fusion  including 3 mm of anterolisthesis at C3-C4 as described.   ? Patient Stated Goals to get his MRI   ? Currently in Pain? Yes   ? Pain Score 3    ? Pain Location Neck   ? Pain Orientation Right;Left   ? Pain Descriptors / Indicators Aching   ? Pain Type Neuropathic pain   ? Pain Onset More than a month ago   ? Pain Frequency Intermittent   ? ?  ?  ? ?  ? ? ? ? ? ? ?  ?  Manual: ?Cervical side bent with overpressure at glenohumeral joint and occipital region 3x30 second holds each direction ?Cervical rotation with overpressure at glenohumeral joint and occipital region 3x30 second holds ?Suboccipital release 3x30 second holds  ?Grade II UPA and CPA 5 seconds each level thoracic and cervical; C4-5 UPA reduced radiating pain ?  ?  ?TherEx ?Scapular depression with lateral rotation cervical hold 5x10 second holds ?Cervical distraction with towel with cervical extension 5 second holds 10x  ?Scapular retraction with overhead RTB raise in Y position 15x ?RTB ER 10x ? ?  ?On green swiss ball: I's, T's, Y's 10x each position  ?  ?Against wall: ?RTB wall walks cues for keeping elbows in line with wrists 10x ?RTB Y with back to wall. Cues for  chin tucks 10x ?L stretch on 10 seconds x 3 trials ?Straight arm lat pull down 10x with 5 second holds.  ?  ?Pt educated throughout session about proper posture and technique with exercises. Improved exercise technique, movement at target joints, use of target muscles after min to mod verbal, visual, tactile cues. ? ?Patient tolerates progressive postural strengthening interventions. Focused mobilizations and muscle tissue lengthening tolerated well additionally. No dry needling required today due to decreased tension in upper traps. Patient will continue to benefit from skilled physical therapy intervention in order to improve his pain, posture, range of motion, in order to allow him to return to his prior level of function and activities. ? ? ? ? ? ? ? ? ? ? ? ? ? ? ? ? ? ? ? ? ? PT Education - 05/29/21 0909   ? ? Education Details exercise technique, body mechanics   ? Person(s) Educated Patient   ? Methods Explanation;Demonstration;Tactile cues;Verbal cues   ? Comprehension Verbalized understanding;Returned demonstration;Verbal cues required;Tactile cues required   ? ?  ?  ? ?  ? ? ? PT Short Term Goals - 04/26/21 1624   ? ?  ? PT SHORT TERM GOAL #1  ? Title Patient will be independent in home exercise program to improve strength/mobility for better functional independence with ADLs.   ? Baseline 3/1: HEP given   ? Time 2   ? Period Weeks   ? Status New   ? Target Date 05/10/21   ? ?  ?  ? ?  ? ? ? ? PT Long Term Goals - 04/26/21 1624   ? ?  ? PT LONG TERM GOAL #1  ? Title Patient will increase FOTO score to equal to or greater than 62%    to demonstrate statistically significant improvement in mobility and quality of life.   ? Baseline 3/1: 59%   ? Time 6   ? Period Weeks   ? Status New   ? Target Date 06/07/21   ?  ? PT LONG TERM GOAL #2  ? Title Patient will report a worst pain of 3/10 on VAS in head, neck and R shoulder to improve tolerance with ADLs and reduced symptoms with activities.   ? Baseline 3/1:  9/10 pain in all   ? Time 6   ? Period Weeks   ? Status New   ? Target Date 06/07/21   ?  ? PT LONG TERM GOAL #3  ? Title Patient will reduce Neck Disability Index score to <10% to demonstrate minimal disability with ADL?s including improved sleeping tolerance, sitting tolerance, etc for better mobility at home and work.   ? Baseline 3/1: 20%   ? Time 6   ?  Period Weeks   ? Status New   ? Target Date 06/07/21   ?  ? PT LONG TERM GOAL #4  ? Title Patient will improve AROM UE so they are able to perform overhead ADL's such as reaching into cabinets   ? Baseline 3/1: has increased pain reaching overhead   ? Time 6   ? Period Weeks   ? Status New   ? Target Date 06/07/21   ? ?  ?  ? ?  ? ? ? ? ? ? ? ? Plan - 05/29/21 0913   ? ? Clinical Impression Statement Patient tolerates progressive postural strengthening interventions. Focused mobilizations and muscle tissue lengthening tolerated well additionally. No dry needling required today due to decreased tension in upper traps. Patient will continue to benefit from skilled physical therapy intervention in order to improve his pain, posture, range of motion, in order to allow him to return to his prior level of function and activities.   ? Personal Factors and Comorbidities Age;Comorbidity 3+;Profession;Past/Current Experience;Time since onset of injury/illness/exacerbation   ? Comorbidities ACDF C5-6,C6-7; HLD, migraine   ? Examination-Activity Limitations Carry;Locomotion Level;Lift;Reach Overhead   ? Examination-Participation Restrictions Cleaning;Driving;Meal Prep;Laundry;Occupation;Shop;Volunteer;Yard Work;Other   ? Stability/Clinical Decision Making Evolving/Moderate complexity   ? Rehab Potential Fair   ? PT Frequency 2x / week   ? PT Duration 6 weeks   ? PT Treatment/Interventions ADLs/Self Care Home Management;Canalith Repostioning;Cryotherapy;Electrical Stimulation;Iontophoresis '4mg'$ /ml Dexamethasone;Moist Heat;Ultrasound;Traction;Functional mobility  training;Therapeutic activities;Neuromuscular re-education;Balance training;Therapeutic exercise;Patient/family education;Manual techniques;Passive range of motion;Dry needling;Vestibular;Vasopneumatic Device;Taping;Splintin

## 2021-05-31 ENCOUNTER — Ambulatory Visit: Payer: BLUE CROSS/BLUE SHIELD

## 2021-05-31 DIAGNOSIS — G8929 Other chronic pain: Secondary | ICD-10-CM | POA: Diagnosis not present

## 2021-05-31 DIAGNOSIS — M25511 Pain in right shoulder: Secondary | ICD-10-CM | POA: Diagnosis not present

## 2021-05-31 DIAGNOSIS — M542 Cervicalgia: Secondary | ICD-10-CM

## 2021-05-31 DIAGNOSIS — R293 Abnormal posture: Secondary | ICD-10-CM

## 2021-05-31 NOTE — Therapy (Signed)
[facility] ?[place], [place], [address] ?Phone: [phone]   Fax:  [phone] ? ?Physical Therapy Treatment ? ?Patient Details  ?Name: Jeremiah Carpenter ?MRN: [id number] ?Date of Birth: 13-Oct-1965 ?Referring Provider (PT): Gillis Santa ? ? ?Encounter Date: 05/31/2021 ? ? PT End of Session - 05/31/21 1239   ? ? Visit Number 9   ? Number of Visits 12   ? Date for PT Re-Evaluation 06/07/21   ? Authorization Type 9/10 eval 04/26/21   ? Progress Note Due on Visit 10   ? PT Start Time 0845   ? PT Stop Time 0929   ? PT Time Calculation (min) 44 min   ? Equipment Utilized During Treatment Gait belt   ? Activity Tolerance Patient tolerated treatment well   ? Behavior During Therapy [facility] for tasks assessed/performed   ? ?  ?  ? ?  ? ? ?Past Medical History:  ?Diagnosis Date  ? History of colon polyps   ? benign  ? History of kidney stones   ? History of migraine   ? last one about a month ago  ? Hyperlipidemia   ? takes Atorvastatin daily  ? Joint pain   ? Weakness   ? numbness mainly left hand and rarely on right  ? ? ?Past Surgical History:  ?Procedure Laterality Date  ? ANTERIOR CERVICAL DECOMP/DISCECTOMY FUSION N/A 01/11/2016  ? Procedure: C5-6, C6-7 Anterior Cervical Discectomy and Fusion, Allograft, Plate;  Surgeon: Marybelle Killings, MD;  Location: [facility];  Service: Orthopedics;  Laterality: N/A;  ? CARPAL TUNNEL RELEASE Bilateral   ? COLONOSCOPY    ? COLONOSCOPY WITH PROPOFOL N/A 01/25/2020  ? Procedure: COLONOSCOPY WITH PROPOFOL;  Surgeon: Jonathon Bellows, MD;  Location: [facility] ENDOSCOPY;  Service: Gastroenterology;  Laterality: N/A;  ? VASECTOMY    ? ? ?There were no vitals filed for this visit. ? ? Subjective Assessment - 05/31/21 1238   ? ? Subjective Patient reports he is having increased numbness of LUE.   ? Pertinent History Jeremiah Carpenter is a pleasant 56 year old male with a history of ACDF, two-level, C5-C6 and C6-C7 performed in 2017 who presents with a chief  complaint of neck pain with radiation into right shoulder.   He has done chiropractic therapy as well as TENS unit. He has not done physical therapy in the last 2 years.  His pain is worse with lateral rotation. Pain began in 2019, has been going to [facility] and acupuncture. Pain worsens when he picks up objects/lifting. Patient works for an [employer] fund[employer]. PMH includes tension headaches, cervicalgia, HLD.   ? Limitations Lifting;Standing;House hold activities;Writing   ? How long can you sit comfortably? painful when reaching with hands   ? How long can you stand comfortably? n/a   ? How long can you walk comfortably? n/a   ? Diagnostic tests Degenerative changes above the level of fusion  including 3 mm of anterolisthesis at C3-C4 as described.   ? Patient Stated Goals to get his MRI   ? Currently in Pain? Yes   ? Pain Score 3    ? Pain Location Neck   ? Pain Orientation Right;Left   ? Pain Descriptors / Indicators Aching   ? Pain Onset More than a month ago   ? Pain Frequency Intermittent   ? ?  ?  ? ?  ? ? ? ? ? ? ?  ?Manual: ?Cervical side bent with overpressure at glenohumeral joint  and occipital region 3x30 second holds each direction ?Cervical rotation with overpressure at glenohumeral joint and occipital region 3x30 second holds ?Suboccipital release 3x30 second holds  ?Grade II UPA and CPA 5 seconds each level thoracic and cervical; C4-5 UPA reduced radiating pain ? Median nerve glide 10x each UE  ?  ?TherEx ?Scapular retraction with overhead RTB raise in Y position 15x ?Prone W's 10x  ? ?Against wall: ?RTB wall walks cues for keeping elbows in line with wrists 10x ?Posture 10x scapular retraction with chin tuck  ? ?  ?Trigger Point Dry Needling (TDN), unbilled ?Education performed with patient regarding potential benefit of TDN. Reviewed precautions and risks with patient. Reviewed special precautions/risks over lung fields which include pneumothorax. Reviewed signs and symptoms of  pneumothorax and advised pt to go to ER immediately if these symptoms develop advise them of dry needling treatment. Extensive time spent with pt to ensure full understanding of TDN risks. Pt provided verbal consent to treatment. TDN performed to  with 0.3 x 30 single needle placements with local twitch response (LTR). Pistoning technique utilized. Improved pain-free motion following intervention. Muscles targeted: L and R upper traps, bilateral cervical paraspinals, L bicep and L wrist flexors and extensors x 11 minutes  ? ?Pt educated throughout session about proper posture and technique with exercises. Improved exercise technique, movement at target joints, use of target muscles after min to mod verbal, visual, tactile cues. ? ? ?Patient tolerates progressive TDN with multiple trigger points released. Postural strengthening and positioning interventions performed with patient reporting challenge. Continued strengthening of postural musculature in combination of lengthening of musculature for correct alignment to reduce pain. Patient reports decreased neurological symptoms by end of session. Patient will continue to benefit from skilled physical therapy intervention in order to improve his pain, posture, range of motion, in order to allow him to return to his prior level of function and activities. ? ? ? ? ? ? ? ? ? ? ? ? ? ? ? ? ? ? ? PT Education - 05/31/21 0908   ? ? Education Details exercise technique, body mechanics   ? Person(s) Educated Patient   ? Methods Explanation;Tactile cues;Demonstration;Verbal cues   ? Comprehension Verbalized understanding;Returned demonstration;Verbal cues required;Tactile cues required   ? ?  ?  ? ?  ? ? ? PT Short Term Goals - 04/26/21 1624   ? ?  ? PT SHORT TERM GOAL #1  ? Title Patient will be independent in home exercise program to improve strength/mobility for better functional independence with ADLs.   ? Baseline 3/1: HEP given   ? Time 2   ? Period Weeks   ? Status New    ? Target Date 05/10/21   ? ?  ?  ? ?  ? ? ? ? PT Long Term Goals - 04/26/21 1624   ? ?  ? PT LONG TERM GOAL #1  ? Title Patient will increase FOTO score to equal to or greater than 62%    to demonstrate statistically significant improvement in mobility and quality of life.   ? Baseline 3/1: 59%   ? Time 6   ? Period Weeks   ? Status New   ? Target Date 06/07/21   ?  ? PT LONG TERM GOAL #2  ? Title Patient will report a worst pain of 3/10 on VAS in head, neck and R shoulder to improve tolerance with ADLs and reduced symptoms with activities.   ? Baseline 3/1: 9/10 pain in  all   ? Time 6   ? Period Weeks   ? Status New   ? Target Date 06/07/21   ?  ? PT LONG TERM GOAL #3  ? Title Patient will reduce Neck Disability Index score to <10% to demonstrate minimal disability with ADL?s including improved sleeping tolerance, sitting tolerance, etc for better mobility at home and work.   ? Baseline 3/1: 20%   ? Time 6   ? Period Weeks   ? Status New   ? Target Date 06/07/21   ?  ? PT LONG TERM GOAL #4  ? Title Patient will improve AROM UE so they are able to perform overhead ADL's such as reaching into cabinets   ? Baseline 3/1: has increased pain reaching overhead   ? Time 6   ? Period Weeks   ? Status New   ? Target Date 06/07/21   ? ?  ?  ? ?  ? ? ? ? ? ? ? ? Plan - 05/31/21 1243   ? ? Clinical Impression Statement Patient tolerates progressive TDN with multiple trigger points released. Postural strengthening and positioning interventions performed with patient reporting challenge. Continued strengthening of postural musculature in combination of lengthening of musculature for correct alignment to reduce pain. Patient reports decreased neurological symptoms by end of session. Patient will continue to benefit from skilled physical therapy intervention in order to improve his pain, posture, range of motion, in order to allow him to return to his prior level of function and activities.   ? Personal Factors and Comorbidities  Age;Comorbidity 3+;Profession;Past/Current Experience;Time since onset of injury/illness/exacerbation   ? Comorbidities ACDF C5-6,C6-7; HLD, migraine   ? Examination-Activity Limitations Carry;Locomotion Level;L

## 2021-06-05 ENCOUNTER — Ambulatory Visit: Payer: BLUE CROSS/BLUE SHIELD

## 2021-06-07 ENCOUNTER — Ambulatory Visit: Payer: BLUE CROSS/BLUE SHIELD

## 2021-06-07 DIAGNOSIS — M25511 Pain in right shoulder: Secondary | ICD-10-CM | POA: Diagnosis not present

## 2021-06-07 DIAGNOSIS — G8929 Other chronic pain: Secondary | ICD-10-CM

## 2021-06-07 DIAGNOSIS — R293 Abnormal posture: Secondary | ICD-10-CM | POA: Diagnosis not present

## 2021-06-07 DIAGNOSIS — M542 Cervicalgia: Secondary | ICD-10-CM | POA: Diagnosis not present

## 2021-06-07 NOTE — Therapy (Signed)
[facility] ?[place], [place], [address] ?Phone: [phone]   Fax:  [phone] ? ?Physical Therapy Treatment/DISCHARGE/Physical Therapy Progress Note ? ? ?Dates of reporting period  04/26/21   to   06/07/21  ? ?Patient Details  ?Name: Jeremiah Carpenter ?MRN: [id number] ?Date of Birth: [birth year] ?Referring Provider (PT): Gillis Santa ? ? ?Encounter Date: 06/07/2021 ? ? PT End of Session - 06/07/21 0859   ? ? Visit Number 10   ? Number of Visits 12   ? Date for PT Re-Evaluation 06/07/21   ? Authorization Type 10/10 eval 04/26/21   ? Progress Note Due on Visit 10   ? PT Start Time 0845   ? PT Stop Time 0929   ? PT Time Calculation (min) 44 min   ? Equipment Utilized During Treatment Gait belt   ? Activity Tolerance Patient tolerated treatment well   ? Behavior During Therapy [facility] for tasks assessed/performed   ? ?  ?  ? ?  ? ? ?Past Medical History:  ?Diagnosis Date  ? History of colon polyps   ? benign  ? History of kidney stones   ? History of migraine   ? last one about a month ago  ? Hyperlipidemia   ? takes Atorvastatin daily  ? Joint pain   ? Weakness   ? numbness mainly left hand and rarely on right  ? ? ?Past Surgical History:  ?Procedure Laterality Date  ? ANTERIOR CERVICAL DECOMP/DISCECTOMY FUSION N/A 01/11/2016  ? Procedure: C5-6, C6-7 Anterior Cervical Discectomy and Fusion, Allograft, Plate;  Surgeon: Marybelle Killings, MD;  Location: [facility];  Service: Orthopedics;  Laterality: N/A;  ? CARPAL TUNNEL RELEASE Bilateral   ? COLONOSCOPY    ? COLONOSCOPY WITH PROPOFOL N/A 01/25/2020  ? Procedure: COLONOSCOPY WITH PROPOFOL;  Surgeon: Jonathon Bellows, MD;  Location: [facility] END[facility];  Service: Gastroenterology;  Laterality: N/A;  ? VASECTOMY    ? ? ?There were no vitals filed for this visit. ? ? Subjective Assessment - 06/07/21 0859   ? ? Subjective Patient wants today to be his last day as pain relief is short term not long term. Agreeable to follow up with  physician for MRI.   ? Pertinent History Jeremiah Carpenter is a pleasant 56 year old male with a history of ACDF, two-level, C5-C6 and C6-C7 performed in 2017 who presents with a chief complaint of neck pain with radiation into right shoulder.   He has done chiropractic therapy as well as TENS unit. He has not done physical therapy in the last 2 years.  His pain is worse with lateral rotation. Pain began in 2019, has been going to [facility] and acupuncture. Pain worsens when he picks up objects/lifting. Patient works for an [employer] fund[employer]. PMH includes tension headaches, cervicalgia, HLD.   ? Limitations Lifting;Standing;House hold activities;Writing   ? How long can you sit comfortably? painful when reaching with hands   ? How long can you stand comfortably? n/a   ? How long can you walk comfortably? n/a   ? Diagnostic tests Degenerative changes above the level of fusion  including 3 mm of anterolisthesis at C3-C4 as described.   ? Patient Stated Goals to get his MRI   ? Currently in Pain? Yes   ? Pain Score 7    ? Pain Location Neck   ? Pain Orientation Right;Left   ? Pain Descriptors / Indicators Aching   ? Pain Type Neuropathic pain   ?  Pain Onset More than a month ago   ? Pain Frequency Intermittent   ? ?  ?  ? ?  ? ? ? ? ? ? ? ? ?GOALS ? ?HEP: compliant ?FOTO: 61% ?VAS: 8/10  ?NDI: 14% ?AROM UE : WFL; slight pain reaching overhead.  ? ?Manual: ?Cervical side bent with overpressure at glenohumeral joint and occipital region 3x30 second holds each direction ?Cervical rotation with overpressure at glenohumeral joint and occipital region 3x30 second holds ?Suboccipital release 3x30 second holds  ?Grade II UPA and CPA 5 seconds each level thoracic and cervical; C4-5 UPA reduced radiating pain ? ?  ? ?Trigger Point Dry Needling (TDN), unbilled ?Education performed with patient regarding potential benefit of TDN. Reviewed precautions and risks with patient. Reviewed special precautions/risks over lung fields which  include pneumothorax. Reviewed signs and symptoms of pneumothorax and advised pt to go to ER immediately if these symptoms develop advise them of dry needling treatment. Extensive time spent with pt to ensure full understanding of TDN risks. Pt provided verbal consent to treatment. TDN performed to  with 0.3 x 30 single needle placements with local twitch response (LTR). Pistoning technique utilized. Improved pain-free motion following intervention. Muscles targeted: L and R upper traps, bilateral cervical paraspinals, L bicep and L wrist flexors and extensors x 11 minutes  ?  ?Pt educated throughout session about proper posture and technique with exercises. Improved exercise technique, movement at target joints, use of target muscles after min to mod verbal, visual, tactile cues. ?  ? ? ? ? ?Patient would benefit from MRI and follow up with orthopedic surgeon for further care.  His pain continues to be present but his posture has improved and he is able to perform HEP at home with good reduction of risk of flare up. PT is helping with short term pain relief but has limtied long term carryover. At this time he needs further follow up with orthopedic physician. I will be happy to see this patient again in the future as needed.  ? ? ? ? ? ? ? ? ? ? ? ? ? ? ? ? PT Education - 06/07/21 0859   ? ? Education Details discharge   ? Person(s) Educated Patient   ? Methods Explanation   ? Comprehension Verbalized understanding   ? ?  ?  ? ?  ? ? ? PT Short Term Goals - 06/07/21 0858   ? ?  ? PT SHORT TERM GOAL #1  ? Title Patient will be independent in home exercise program to improve strength/mobility for better functional independence with ADLs.   ? Baseline 3/1: HEP given 4/12:  compliant   ? Time 2   ? Period Weeks   ? Status Achieved   ? Target Date 05/10/21   ? ?  ?  ? ?  ? ? ? ? PT Long Term Goals - 06/07/21 0855   ? ?  ? PT LONG TERM GOAL #1  ? Title Patient will increase FOTO score to equal to or greater than 62%     to demonstrate statistically significant improvement in mobility and quality of life.   ? Baseline 3/1: 59% 4/12: 61%   ? Time 6   ? Period Weeks   ? Status Partially Met   ? Target Date 06/07/21   ?  ? PT LONG TERM GOAL #2  ? Title Patient will report a worst pain of 3/10 on VAS in head, neck and R shoulder to improve tolerance  with ADLs and reduced symptoms with activities.   ? Baseline 3/1: 9/10 pain in all 7/12: 8/10   ? Time 6   ? Period Weeks   ? Status Partially Met   ? Target Date 06/07/21   ?  ? PT LONG TERM GOAL #3  ? Title Patient will reduce Neck Disability Index score to <10% to demonstrate minimal disability with ADL?s including improved sleeping tolerance, sitting tolerance, etc for better mobility at home and work.   ? Baseline 3/1: 20% 4/12: 14%   ? Time 6   ? Period Weeks   ? Status Partially Met   ? Target Date 06/07/21   ?  ? PT LONG TERM GOAL #4  ? Title Patient will improve AROM UE so they are able to perform overhead ADL's such as reaching into cabinets   ? Baseline 3/1: has increased pain reaching overhead 4/12: WFL AROM   ? Time 6   ? Period Weeks   ? Status Achieved   ? Target Date 06/07/21   ? ?  ?  ? ?  ? ? ? ? ? ? ? ? Plan - 06/07/21 1244   ? ? Clinical Impression Statement Patient would benefit from MRI and follow up with orthopedic surgeon for further care.  His pain continues to be present but his posture has improved and he is able to perform HEP at home with good reduction of risk of flare up. PT is helping with short term pain relief but has limtied long term carryover. At this time he needs further follow up with orthopedic physician. I will be happy to see this patient again in the future as needed.   ? Personal Factors and Comorbidities Age;Comorbidity 3+;Profession;Past/Current Experience;Time since onset of injury/illness/exacerbation   ? Comorbidities ACDF C5-6,C6-7; HLD, migraine   ? Examination-Activity Limitations Carry;Locomotion Level;Lift;Reach Overhead   ?  Examination-Participation Restrictions Cleaning;Driving;Meal Prep;Laundry;Occupation;Shop;Volunteer;Yard Work;Other   ? Stability/Clinical Decision Making Evolving/Moderate complexity   ? Rehab Potential Fair   ? P

## 2021-06-12 ENCOUNTER — Ambulatory Visit: Payer: BLUE CROSS/BLUE SHIELD

## 2021-06-14 ENCOUNTER — Ambulatory Visit: Payer: BLUE CROSS/BLUE SHIELD

## 2021-06-19 ENCOUNTER — Ambulatory Visit: Payer: BLUE CROSS/BLUE SHIELD

## 2021-06-21 ENCOUNTER — Ambulatory Visit: Payer: BLUE CROSS/BLUE SHIELD

## 2021-06-22 ENCOUNTER — Encounter: Payer: Self-pay | Admitting: Student in an Organized Health Care Education/Training Program

## 2021-06-22 DIAGNOSIS — G894 Chronic pain syndrome: Secondary | ICD-10-CM

## 2021-06-22 DIAGNOSIS — M5412 Radiculopathy, cervical region: Secondary | ICD-10-CM

## 2021-06-22 DIAGNOSIS — M542 Cervicalgia: Secondary | ICD-10-CM

## 2021-06-22 DIAGNOSIS — Z981 Arthrodesis status: Secondary | ICD-10-CM

## 2021-06-26 ENCOUNTER — Ambulatory Visit: Payer: BLUE CROSS/BLUE SHIELD

## 2021-06-26 NOTE — Telephone Encounter (Signed)
Patient has completed physical therapy and continues to have persistent cervical radicular pain in the context of having an ACDF.  Recommend repeat cervical MRI and then considering cervical epidural steroid injection.  Order placed below for cervical MRI.   ? ?Orders Placed This Encounter  ?Procedures  ? MR CERVICAL SPINE WO CONTRAST  ?  Patient presents with axial pain with possible radicular component. Please assist [place] in identifying specific level(s) and laterality of any additional findings such as: ?1. Facet (Zygapophyseal) joint DJD (Hypertrophy, space narrowing, subchondral sclerosis, and/or osteophyte formation) ?2. DDD and/or IVDD (Loss of disc height, desiccation, gas patterns, osteophytes, endplate sclerosis, or "Black disc disease") ?3. Pars defects ?4. Spondylolisthesis, spondylosis, and/or spondyloarthropathies (include Degree/Grade of displacement in mm) (stability) ?5. Vertebral body Fractures (acute/chronic) (state percentage of collapse) ?6. Demineralization (osteopenia/osteoporotic) ?7. Bone pathology ?8. Foraminal narrowing  ?9. Surgical changes ?10. Central, Lateral Recess, and/or Foraminal Stenosis (include AP diameter of stenosis in mm) ?11. Surgical changes (hardware type, status, and presence of fibrosis) ?12. Modic Type Changes (MRI only) ?13. IVDD (Disc bulge, protrusion, herniation, extrusion) (Level, laterality, extent)  ?  Standing Status:   Future  ?  Standing Expiration Date:   07/27/2021  ?  Scheduling Instructions:  ?   Imaging must be done as soon as possible. Inform patient that order will expire within 30 days and I will not renew it.  ?  Order Specific Question:   What is the patient's sedation requirement?  ?  Answer:   No Sedation  ?  Order Specific Question:   Does the patient have a pacemaker or implanted devices?  ?  Answer:   No  ?  Order Specific Question:   Preferred imaging location?  ?  Answer:   [facility] (table limit-350lbs)  ?  Order Specific Question:    Call Results- Best Contact Number?  ?  Answer:   [phone] [facility])  ?  Order Specific Question:   Radiology Contrast Protocol - do NOT remove file path  ?  Answer:   [email or web address]  ? ? ?

## 2021-06-28 ENCOUNTER — Ambulatory Visit: Payer: BLUE CROSS/BLUE SHIELD

## 2021-06-28 ENCOUNTER — Other Ambulatory Visit: Payer: Self-pay | Admitting: Student in an Organized Health Care Education/Training Program

## 2021-06-28 DIAGNOSIS — M5412 Radiculopathy, cervical region: Secondary | ICD-10-CM

## 2021-06-28 DIAGNOSIS — Z981 Arthrodesis status: Secondary | ICD-10-CM

## 2021-07-02 ENCOUNTER — Other Ambulatory Visit: Payer: Self-pay | Admitting: Family Medicine

## 2021-07-02 DIAGNOSIS — E782 Mixed hyperlipidemia: Secondary | ICD-10-CM

## 2021-07-03 ENCOUNTER — Ambulatory Visit: Payer: BLUE CROSS/BLUE SHIELD

## 2021-07-05 ENCOUNTER — Encounter: Payer: Self-pay | Admitting: Family Medicine

## 2021-07-05 ENCOUNTER — Ambulatory Visit: Payer: BLUE CROSS/BLUE SHIELD

## 2021-07-05 DIAGNOSIS — E782 Mixed hyperlipidemia: Secondary | ICD-10-CM

## 2021-07-07 ENCOUNTER — Ambulatory Visit
Admission: RE | Admit: 2021-07-07 | Discharge: 2021-07-07 | Disposition: A | Discharge status: Home / Self Care | Payer: BLUE CROSS/BLUE SHIELD | Source: Ambulatory Visit | Attending: Student in an Organized Health Care Education/Training Program | Admitting: Student in an Organized Health Care Education/Training Program

## 2021-07-07 DIAGNOSIS — M5412 Radiculopathy, cervical region: Secondary | ICD-10-CM | POA: Diagnosis not present

## 2021-07-07 DIAGNOSIS — Z981 Arthrodesis status: Secondary | ICD-10-CM | POA: Insufficient documentation

## 2021-07-07 DIAGNOSIS — M542 Cervicalgia: Secondary | ICD-10-CM | POA: Diagnosis not present

## 2021-07-07 DIAGNOSIS — G894 Chronic pain syndrome: Secondary | ICD-10-CM | POA: Insufficient documentation

## 2021-07-07 DIAGNOSIS — M4312 Spondylolisthesis, cervical region: Secondary | ICD-10-CM | POA: Diagnosis not present

## 2021-07-10 ENCOUNTER — Ambulatory Visit: Payer: BLUE CROSS/BLUE SHIELD

## 2021-07-10 ENCOUNTER — Other Ambulatory Visit: Payer: Self-pay

## 2021-07-10 ENCOUNTER — Telehealth: Payer: Self-pay | Admitting: Student in an Organized Health Care Education/Training Program

## 2021-07-10 DIAGNOSIS — M5412 Radiculopathy, cervical region: Secondary | ICD-10-CM

## 2021-07-10 DIAGNOSIS — Z981 Arthrodesis status: Secondary | ICD-10-CM

## 2021-07-10 MED ORDER — GABAPENTIN 100 MG PO CAPS: 100.0000 mg | ORAL_CAPSULE | Freq: Every day | ORAL | 0 refills | Status: DC

## 2021-07-10 NOTE — Telephone Encounter (Signed)
Patient is out of gabapentin and is leaving in the morning, will be gone until after memorial day. Is asking if Dr. Holley Raring can send in refill for this script. Please advise patient ?

## 2021-07-11 ENCOUNTER — Ambulatory Visit
Payer: BLUE CROSS/BLUE SHIELD | Attending: Student in an Organized Health Care Education/Training Program | Admitting: Student in an Organized Health Care Education/Training Program

## 2021-07-11 ENCOUNTER — Encounter: Payer: Self-pay | Admitting: Student in an Organized Health Care Education/Training Program

## 2021-07-11 DIAGNOSIS — M542 Cervicalgia: Secondary | ICD-10-CM | POA: Diagnosis not present

## 2021-07-11 DIAGNOSIS — G894 Chronic pain syndrome: Secondary | ICD-10-CM

## 2021-07-11 DIAGNOSIS — M4312 Spondylolisthesis, cervical region: Secondary | ICD-10-CM | POA: Diagnosis not present

## 2021-07-11 DIAGNOSIS — Z981 Arthrodesis status: Secondary | ICD-10-CM

## 2021-07-11 DIAGNOSIS — M5412 Radiculopathy, cervical region: Secondary | ICD-10-CM

## 2021-07-11 NOTE — Progress Notes (Signed)
Patient: Jeremiah Carpenter  Service Category: E/M  Provider: Gillis Santa, MD  ?DOB: Aug 18, 1965  DOS: 07/11/2021  Location: Office  ?MRN: [id number]  Setting: Ambulatory outpatient  Referring Provider: Lesleigh Noe, MD  ?Type: Established Patient  Specialty: Interventional Pain Management  PCP: Lesleigh Noe, MD  ?Location: Remote location  Delivery: TeleHealth    ? ?Virtual Encounter - Pain Management ?PROVIDER NOTE: Information contained herein reflects review and annotations entered in association with encounter. Interpretation of such information and data should be left to medically-trained personnel. Information provided to patient can be located elsewhere in the medical record under "Patient Instructions". Document created using STT-dictation technology, any transcriptional errors that may result from process are unintentional.  ?  ?Contact & Pharmacy ?Preferred: [phone] ?Home: [phone] (home) ?Mobile: [phone] (mobile) ?E-mail: [email or web address]  ?[facility] [place] [place] [address] ?Phone: [phone] Fax: [phone] ? ?[facility] [place], [place] - [address], [address] ?[address], [address] ?[place] [address] ?Phone: [phone] Fax: [phone] ? ?[facility] ([place], [place] ?[place] ?[address] ?[place] [address] ?Phone: [phone] Fax: [phone] ?  ?Pre-screening  ?Jeremiah Carpenter offered "in-person" vs "virtual" encounter. He indicated preferring virtual for this encounter.  ? ?Reason ?COVID-19*  Social distancing based on CDC and AMA recommendations.  ? ?I contacted Jeremiah Carpenter on 07/11/2021 via telephone.      I clearly identified myself as Gillis Santa, MD. I verified that I was speaking with the correct person using two identifiers (Name: Jeremiah Carpenter, and date of birth: 09-28-1965). ? ?Consent ?I  sought verbal advanced consent from Jeremiah Carpenter for virtual visit interactions. I informed Jeremiah Carpenter of possible security and privacy concerns, risks, and limitations associated with providing "not-in-person" medical evaluation and management services. I also informed Jeremiah Carpenter of the availability of "in-person" appointments. Finally, I informed him that there would be a charge for the virtual visit and that he could be  personally, fully or partially, financially responsible for it. Jeremiah Carpenter expressed understanding and agreed to proceed.  ? ?Historic Elements   ?[name] is a 56 y.o. year old, male patient evaluated today after our last contact on 07/10/2021. Jeremiah Carpenter  has a past medical history of History of colon polyps, History of kidney stones, History of migraine, Hyperlipidemia, Joint pain, and Weakness. He also  has a past surgical history that includes Carpal tunnel release (Bilateral); Vasectomy; Colonoscopy; Anterior cervical decomp/discectomy fusion (N/A, 01/11/2016); and Colonoscopy with propofol (N/A, 01/25/2020). Jeremiah Carpenter has a current medication list which includes the following prescription(s): atorvastatin and gabapentin. He  reports that he has quit smoking. He has never used smokeless tobacco. He reports current alcohol use. He reports that he does not currently use drugs. Jeremiah Carpenter is allergic to amoxicillin.  ? ?HPI  ?Today, he is being contacted for follow-up evaluation to review C-MRI and treatment plan ? ?Jeremiah Carpenter continues to endorse cervical spine pain with radiation into his right shoulder in a dermatomal fashion.  He is currently on gabapentin with limited response.  He has been working with physical therapy regularly.  He does take tizanidine very rarely for cervical spinal muscle spasms but states that it makes him drowsy.  Of note he has a history of two-level C5-C6 and C6-C7 ACDF done in 2017 with concerning cervical radicular symptoms in  his  right upper extremity.  For this reason a cervical MRI was obtained results of which are below.  This was reviewed with Jeremiah Carpenter in great detail.  See assessment and plan below. ? ? ?Laboratory Chemistry Profile  ? ?Renal ?Lab Results  ?Component Value Date  ? BUN 21 07/11/2020  ? CREATININE 1.02 07/11/2020  ? GFR 82.06 07/11/2020  ? GFRAA >60 01/04/2016  ? GFRNONAA >60 01/04/2016  ?  Hepatic ?Lab Results  ?Component Value Date  ? AST 19 07/11/2020  ? ALT 20 07/11/2020  ? ALBUMIN 4.5 07/11/2020  ? ALKPHOS 43 07/11/2020  ?  ?Electrolytes ?Lab Results  ?Component Value Date  ? NA 140 07/11/2020  ? K 4.2 07/11/2020  ? CL 104 07/11/2020  ? CALCIUM 9.5 07/11/2020  ?  Bone ?Lab Results  ?Component Value Date  ? TESTOFREE 50.9 10/22/2018  ? TESTOSTERONE 334 10/22/2018  ?  ?Inflammation (CRP: Acute Phase) (ESR: Chronic Phase) ?No results found for: CRP, ESRSEDRATE, LATICACIDVEN    ?  ? ?Note: Above Lab results reviewed. ? ?Imaging  ?MR CERVICAL SPINE WO CONTRAST ?CLINICAL DATA:  Initial evaluation for chronic right lateral neck ?pain with radiation into the right shoulder and base of skull, ?headaches. History of prior surgery. ? ?EXAM: ?MRI CERVICAL SPINE WITHOUT CONTRAST ? ?TECHNIQUE: ?Multiplanar, multisequence MR imaging of the cervical spine was ?performed. No intravenous contrast was administered. ? ?COMPARISON:  Prior MRI from 09/21/2015. ? ?FINDINGS: ?Alignment: Reversal of the normal cervical lordosis with trace ?stepwise degenerative anterolisthesis of C2 on C3 and C3 on C4, ?chronic and facet mediated. ? ?Vertebrae: Prior ACDF at C5-C7. Solid arthrodesis at C5-6. Fusion ?not established at C6-7 by MRI. Vertebral body height maintained ?without acute or chronic fracture. Bone marrow signal intensity ?within normal limits. Subcentimeter benign hemangioma noted within ?the T1 vertebral body. No worrisome osseous lesions. Mild discogenic ?reactive endplate change present about the C4-5 interspace. No other ?abnormal  marrow edema. ? ?Cord: Normal signal and morphology. ? ?Posterior Fossa, vertebral arteries, paraspinal tissues: Visualized ?brain and posterior fossa within normal limits. Craniocervical ?junction normal. Paraspinous soft tissues within normal limits. ?Normal flow voids seen within the vertebral arteries bilaterally. ? ?Disc levels: ? ?C2-C3: Left paracentral disc osteophyte mildly indents the left ?ventral thecal sac. Right-sided facet arthrosis. No significant ?canal or foraminal stenosis. ? ?C3-C4: Trace anterolisthesis. Disc bulge with endplate and ?uncovertebral spurring. Moderate right-sided facet arthrosis. No ?significant spinal stenosis. Severe right C4 foraminal stenosis. ?Left neural foramen remains patent. ? ?C4-C5: Disc bulge with endplate and uncovertebral spurring. Mild ?facet hypertrophy. Resultant mild spinal stenosis. Mild bilateral C5 ?foraminal narrowing. ? ?C5-C6:  Prior fusion.  No residual canal or foraminal stenosis. ? ?C6-C7: Prior fusion. No residual spinal stenosis. Residual ?uncovertebral spurring with persistent severe bilateral C7 foraminal ?stenosis. ? ?C7-T1: Mild left-sided uncovertebral spurring without significant ?disc bulge. Minimal facet hypertrophy. No spinal stenosis. Foramina ?remain patent. ? ?Visualized upper thoracic spine demonstrates minimal noncompressive ?disc bulging without significant stenosis. ? ?IMPRESSION: ?1. Prior ACDF at C5-C7 without residual spinal stenosis. ?Uncovertebral spurring at C6-7 with residual severe bilateral C7 ?foraminal stenosis. ?2. Severe right C4 foraminal stenosis related to uncovertebral and ?facet disease. ?3. Disc bulge with uncovertebral and facet hypertrophy at C4-5 with ?resultant mild canal and bilateral C5 foraminal stenosis. ? ?Electronically Signed ?  By: Jeannine Boga M.D. ?  On: 07/10/2021 05:04 ? ?Assessment  ?The primary encounter diagnosis was Cervical radicular pain. Diagnoses of Anterolisthesis of cervical spine  (C3-C4), S/P cervical spinal  fusion (C5-C7), Cervicalgia, and Chronic pain syndrome were also pertinent to this visit. ? ?Plan of Care  ? ?I reviewed Mike's cervical MRI with him in great detail.  Is experiencing a

## 2021-07-11 NOTE — Telephone Encounter (Signed)
Dr Holley Raring sent in and called to notifypt ?

## 2021-07-12 ENCOUNTER — Telehealth: Payer: Self-pay

## 2021-07-12 NOTE — Telephone Encounter (Signed)
No answer, left message to call office for information for cervical epidural ?

## 2021-07-13 ENCOUNTER — Ambulatory Visit: Payer: BLUE CROSS/BLUE SHIELD

## 2021-07-17 ENCOUNTER — Encounter: Payer: Self-pay | Admitting: Internal Medicine

## 2021-07-17 ENCOUNTER — Ambulatory Visit: Payer: BLUE CROSS/BLUE SHIELD

## 2021-07-19 ENCOUNTER — Ambulatory Visit: Payer: BLUE CROSS/BLUE SHIELD

## 2021-07-20 ENCOUNTER — Other Ambulatory Visit: Payer: Self-pay | Admitting: Family Medicine

## 2021-07-20 DIAGNOSIS — E782 Mixed hyperlipidemia: Secondary | ICD-10-CM

## 2021-07-20 MED ORDER — ATORVASTATIN CALCIUM 20 MG PO TABS: 20.0000 mg | ORAL_TABLET | Freq: Every day | ORAL | 0 refills | Status: DC

## 2021-07-26 ENCOUNTER — Ambulatory Visit: Payer: BLUE CROSS/BLUE SHIELD

## 2021-07-31 ENCOUNTER — Telehealth: Payer: Self-pay

## 2021-07-31 ENCOUNTER — Ambulatory Visit
Admission: RE | Admit: 2021-07-31 | Discharge: 2021-07-31 | Disposition: A | Discharge status: Home / Self Care | Payer: BLUE CROSS/BLUE SHIELD | Source: Ambulatory Visit | Attending: Student in an Organized Health Care Education/Training Program | Admitting: Student in an Organized Health Care Education/Training Program

## 2021-07-31 ENCOUNTER — Other Ambulatory Visit: Payer: Self-pay

## 2021-07-31 ENCOUNTER — Ambulatory Visit
Payer: BLUE CROSS/BLUE SHIELD | Attending: Student in an Organized Health Care Education/Training Program | Admitting: Student in an Organized Health Care Education/Training Program

## 2021-07-31 ENCOUNTER — Ambulatory Visit: Payer: BLUE CROSS/BLUE SHIELD

## 2021-07-31 ENCOUNTER — Encounter: Payer: Self-pay | Admitting: Student in an Organized Health Care Education/Training Program

## 2021-07-31 DIAGNOSIS — M542 Cervicalgia: Secondary | ICD-10-CM | POA: Diagnosis not present

## 2021-07-31 DIAGNOSIS — G894 Chronic pain syndrome: Secondary | ICD-10-CM | POA: Insufficient documentation

## 2021-07-31 DIAGNOSIS — M4312 Spondylolisthesis, cervical region: Secondary | ICD-10-CM | POA: Insufficient documentation

## 2021-07-31 DIAGNOSIS — M5412 Radiculopathy, cervical region: Secondary | ICD-10-CM | POA: Insufficient documentation

## 2021-07-31 DIAGNOSIS — Z981 Arthrodesis status: Secondary | ICD-10-CM | POA: Diagnosis not present

## 2021-07-31 MED ORDER — DEXAMETHASONE SODIUM PHOSPHATE 10 MG/ML IJ SOLN
INTRAMUSCULAR | Status: AC
Start: 2021-07-31 — End: ?
  Filled 2021-07-31: qty 1

## 2021-07-31 MED ORDER — ROPIVACAINE HCL 2 MG/ML IJ SOLN
1.0000 mL | Freq: Once | INTRAMUSCULAR | Status: AC
  Administered 2021-07-31: 1 mL via EPIDURAL

## 2021-07-31 MED ORDER — IOHEXOL 180 MG/ML  SOLN
10.0000 mL | Freq: Once | INTRAMUSCULAR | Status: AC
  Administered 2021-07-31: 5 mL via EPIDURAL

## 2021-07-31 MED ORDER — LIDOCAINE HCL 2 % IJ SOLN
20.0000 mL | Freq: Once | INTRAMUSCULAR | Status: AC
  Administered 2021-07-31: 400 mg

## 2021-07-31 MED ORDER — GABAPENTIN 300 MG PO CAPS: 300.0000 mg | ORAL_CAPSULE | Freq: Two times a day (BID) | ORAL | 2 refills | Status: DC

## 2021-07-31 MED ORDER — ROPIVACAINE HCL 2 MG/ML IJ SOLN
INTRAMUSCULAR | Status: AC
  Filled 2021-07-31: qty 20

## 2021-07-31 MED ORDER — SODIUM CHLORIDE 0.9% FLUSH
1.0000 mL | Freq: Once | INTRAVENOUS | Status: AC
  Administered 2021-07-31: 1 mL

## 2021-07-31 MED ORDER — LIDOCAINE HCL 2 % IJ SOLN
INTRAMUSCULAR | Status: AC
  Filled 2021-07-31: qty 20

## 2021-07-31 MED ORDER — SODIUM CHLORIDE (PF) 0.9 % IJ SOLN
INTRAMUSCULAR | Status: AC
  Filled 2021-07-31: qty 10

## 2021-07-31 MED ORDER — DEXAMETHASONE SODIUM PHOSPHATE 10 MG/ML IJ SOLN
10.0000 mg | Freq: Once | INTRAMUSCULAR | Status: AC
  Administered 2021-07-31: 10 mg

## 2021-07-31 NOTE — Telephone Encounter (Addendum)
He has a procedure today and wants to ask about the valium. I told him to call the pharmacy to see if it was there. He wants instructions on when to take it.  He called back and pharmacy states they didn't get a script. He will need this for this procedure today. [facility]

## 2021-07-31 NOTE — Patient Instructions (Signed)

## 2021-07-31 NOTE — Progress Notes (Signed)
PROVIDER NOTE: Interpretation of information contained herein should be left to medically-trained personnel. Specific patient instructions are provided elsewhere under "Patient Instructions" section of medical record. This document was created in part using STT-dictation technology, any transcriptional errors that may result from this process are unintentional.  Patient: Jeremiah Carpenter Type: Established DOB: 1965/03/18 MRN: [id number] PCP: Lesleigh Noe, MD  Service: Procedure DOS: 07/31/2021 Setting: Ambulatory Location: Ambulatory outpatient facility Delivery: Face-to-face Provider: Gillis Santa, MD Specialty: Interventional Pain Management Specialty designation: 09 Location: Outpatient facility Ref. Prov.: Gillis Santa, MD   Procedure [facility] Interventional Pain Management )   Type: Cervical Epidural Steroid injection (ESI) (Interlaminar) #1  Laterality: Left  Level: T1-2 Imaging: Fluoroscopy-assisted DOS: 07/31/2021  Performed by: Gillis Santa, MD Anesthesia: Local anesthesia (1-2% Lidocaine) Anxiolysis: None                 Sedation: None.   Purpose: Diagnostic/Therapeutic Indications: Cervicalgia, cervical radicular pain, degenerative disc disease, severe enough to impact quality of life or function. 1. Cervicalgia   2. Cervical radicular pain   3. Anterolisthesis of cervical spine (C3-C4)   4. S/P cervical spinal fusion (C5-C7)   5. Chronic pain syndrome    NAS-11 score:   Pre-procedure: 6 /10   Post-procedure: 5 /10     Pre-Procedure Preparation  Monitoring: As per clinic protocol. Respiration, ETCO2, SpO2, BP, heart rate and rhythm monitor placed and checked for adequate function  Risk Assessment: Vitals:  AJO:INOMVEHMC body mass index is 29.62 kg/m as calculated from the following:   Height as of this encounter: '5\' 5"'$  (1.651 m).   Weight as of this encounter: 178 lb (80.7 kg)., Rate:78ECG Heart Rate: 85, BP:(!) 127/94, Resp:18, Temp:97.8 F (36.6 C), SpO2:99 %   Allergies: He is allergic to amoxicillin.  Precautions: None required  Blood-thinner(s): None at this time  Coagulopathies: Reviewed. None identified.   Active Infection(s): Reviewed. None identified. Mr. Hagan is afebrile   Location setting: Procedure suite Position: Prone, on modified reverse trendelenburg to facilitate breathing, with head in head-cradle. Pillows positioned under chest (below chin-level) with cervical spine flexed. Safety Precautions: Patient was assessed for positional comfort and pressure points before starting the procedure. Prepping solution: DuraPrep (Iodine Povacrylex [0.7% available iodine] and Isopropyl Alcohol, 74% w/w) Prep Area: Entire  cervicothoracic region Approach: percutaneous, paramedial Intended target: Posterior cervical epidural space Materials: Tray: Epidural Needle(s): Epidural (Tuohy) Qty: 1 Length: (47m) 3.5-inch Gauge: 22G   Meds ordered this encounter  Medications   iohexol (OMNIPAQUE) 180 MG/ML injection 10 mL    Must be Myelogram-compatible. If not available, you may substitute with a water-soluble, non-ionic, hypoallergenic, myelogram-compatible radiological contrast medium.   lidocaine (XYLOCAINE) 2 % (with pres) injection 400 mg   sodium chloride flush (NS) 0.9 % injection 1 mL   ropivacaine (PF) 2 mg/mL (0.2%) (NAROPIN) injection 1 mL   dexamethasone (DECADRON) injection 10 mg   gabapentin (NEURONTIN) 300 MG capsule    Sig: Take 1 capsule (300 mg total) by mouth 2 (two) times daily.    Dispense:  60 capsule    Refill:  2    Orders Placed This Encounter  Procedures   DG PAIN CLINIC C-ARM 1-60 MIN NO REPORT    Intraoperative interpretation by procedural physician at [facility]    Standing Status:   Standing    Number of Occurrences:   1    Order Specific Question:   Reason for exam:    Answer:   Assistance in needle guidance  and placement for procedures requiring needle placement in or near specific anatomical  locations not easily accessible without such assistance.     Time-out: 1341 I initiated and conducted the "Time-out" before starting the procedure, as per protocol. The patient was asked to participate by confirming the accuracy of the "Time Out" information. Verification of the correct person, site, and procedure were performed and confirmed by me, the nursing staff, and the patient. "Time-out" conducted as per Joint Commission's Universal Protocol (UP.01.01.01). Procedure checklist: Completed   H&P (Pre-op  Assessment)  Mr. Kamel is a 56 y.o. (year old), male patient, seen today for interventional treatment. He  has a past surgical history that includes Carpal tunnel release (Bilateral); Vasectomy; Colonoscopy; Anterior cervical decomp/discectomy fusion (N/A, 01/11/2016); and Colonoscopy with propofol (N/A, 01/25/2020). Mr. Furtick has a current medication list which includes the following prescription(s): atorvastatin and gabapentin. His primarily concern today is the Neck Pain (right)  He is allergic to amoxicillin.   Last encounter: My last encounter with him was on 07/11/2021. Pertinent problems: Mr. Benkert has Cervical stenosis of spine; S/P cervical spinal fusion; Cervical radicular pain; Cervicalgia; and Chronic pain syndrome on their pertinent problem list. Pain Assessment: Severity of Chronic pain is reported as a 6 /10. Location: Neck Right/right shoulder, HA. Onset: More than a month ago. Quality: Stabbing, Shooting, Aching, Constant, Throbbing. Timing: Constant. Modifying factor(s): Gabapentin, stretching, PT exercises, TENS. Vitals:  height is '5\' 5"'$  (1.651 m) and weight is 178 lb (80.7 kg). His temporal temperature is 97.8 F (36.6 C). His blood pressure is 128/96 (abnormal) and his pulse is 78. His respiration is 12 and oxygen saturation is 98%.   Reason for encounter: Interventional pain management therapy due pain of at least four (4) weeks in duration, with to failure to  respond to and/or inability to tolerate more conservative care.   Site Confirmation: Mr. Camargo was asked to confirm the procedure and laterality before marking the site.  Consent: Before the procedure and under the influence of no sedative(s), amnesic(s), or anxiolytics, the patient was informed of the treatment options, risks and possible complications. To fulfill our ethical and legal obligations, as recommended by the American Medical Association's Code of Ethics, I have informed the patient of my clinical impression; the nature and purpose of the treatment or procedure; the risks, benefits, and possible complications of the intervention; the alternatives, including doing nothing; the risk(s) and benefit(s) of the alternative treatment(s) or procedure(s); and the risk(s) and benefit(s) of doing nothing. The patient was provided information about the general risks and possible complications associated with the procedure. These may include, but are not limited to: failure to achieve desired goals, infection, bleeding, organ or nerve damage, allergic reactions, paralysis, and death. In addition, the patient was informed of those risks and complications associated to Spine-related procedures, such as failure to decrease pain; infection (i.e.: Meningitis, epidural or intraspinal abscess); bleeding (i.e.: epidural hematoma, subarachnoid hemorrhage, or any other type of intraspinal or peri-dural bleeding); organ or nerve damage (i.e.: Any type of peripheral nerve, nerve root, or spinal cord injury) with subsequent damage to sensory, motor, and/or autonomic systems, resulting in permanent pain, numbness, and/or weakness of one or several areas of the body; allergic reactions; (i.e.: anaphylactic reaction); and/or death. Furthermore, the patient was informed of those risks and complications associated with the medications. These include, but are not limited to: allergic reactions (i.e.: anaphylactic or  anaphylactoid reaction(s)); adrenal axis suppression; blood sugar elevation that in diabetics may result in  ketoacidosis or comma; water retention that in patients with history of congestive heart failure may result in shortness of breath, pulmonary edema, and decompensation with resultant heart failure; weight gain; swelling or edema; medication-induced neural toxicity; particulate matter embolism and blood vessel occlusion with resultant organ, and/or nervous system infarction; and/or aseptic necrosis of one or more joints. Finally, the patient was informed that Medicine is not an exact science; therefore, there is also the possibility of unforeseen or unpredictable risks and/or possible complications that may result in a catastrophic outcome. The patient indicated having understood very clearly. We have given the patient no guarantees and we have made no promises. Enough time was given to the patient to ask questions, all of which were answered to the patient's satisfaction. Mr. Godeaux has indicated that he wanted to continue with the procedure. Attestation: I, the ordering provider, attest that I have discussed with the patient the benefits, risks, side-effects, alternatives, likelihood of achieving goals, and potential problems during recovery for the procedure that I have provided informed consent.  Date  Time: 07/31/2021  1:10 PM   Prophylactic antibiotics  Anti-infectives (From admission, onward)    None      Indication(s): None identified   Description of procedure   Start Time: 1341 hrs  Local Anesthesia: Once the patient was positioned, prepped, and time-out was completed. The target area was identified located. The skin was marked with an approved surgical skin marker. Once marked, the skin (epidermis, dermis, and hypodermis), and deeper tissues (fat, connective tissue and muscle) were infiltrated with a small amount of a short-acting local anesthetic, loaded on a 10cc syringe with a  25G, 1.5-in  Needle. An appropriate amount of time was allowed for local anesthetics to take effect before proceeding to the next step. Local Anesthetic: Lidocaine 1-2% The unused portion of the local anesthetic was discarded in the proper designated containers. Safety Precautions: Aspiration looking for blood return was conducted prior to all injections. At no point did I inject any substances, as a needle was being advanced. Before injecting, the patient was told to immediately notify me if he was experiencing any new onset of "ringing in the ears, or metallic taste in the mouth". No attempts were made at seeking any paresthesias. Safe injection practices and needle disposal techniques used. Medications properly checked for expiration dates. SDV (single dose vial) medications used. After the completion of the procedure, all disposable equipment used was discarded in the proper designated medical waste containers.  Technical description: Protocol guidelines were followed. Using fluoroscopic guidance, the epidural needle was introduced through the skin, ipsilateral to the reported pain, and advanced to the target area. Posterior laminar os was contacted and the needle walked caudad, until the lamina was cleared. The ligamentum flavum was engaged and the epidural space identified using "loss-of-resistance technique" with 2-3 ml of PF-NaCl (0.9% NSS), in a 5cc dedicated LOR syringe. See "Imaging guidance" below for use of contrast details.  Injection: Once satisfactory needle placement was confirmed, I proceeded to inject the desired solution in slow, incremental fashion, intermittently assessing for discomfort or any signs of abnormal or undesired spread of substance. Once completed, the needle was removed and disposed of, as per hospital protocols.   Vitals:   07/31/21 1319 07/31/21 1337 07/31/21 1344 07/31/21 1347  BP: (!) 127/94 132/81 (!) 135/97 (!) 128/96  Pulse: 78     Resp: 18 (!) '22 11 12  '$ Temp:  97.8 F (36.6 C)     TempSrc: Temporal  SpO2: 99% 97% 99% 98%  Weight: 178 lb (80.7 kg)     Height: '5\' 5"'$  (1.651 m)       3 cc solution made of 1cc of preservative-free saline, 1cc of 0.2% ropivacaine, 1 cc of Decadron 10 mg/cc.   End Time: 1346 hrs  Once the entire procedure was completed, the treated area was cleaned, making sure to leave some of the prepping solution back to take advantage of its long term bactericidal properties.   Imaging guidance  Type of Imaging Technique: Fluoroscopy Guidance (Spinal) Indication(s): Assistance in needle guidance and placement for procedures requiring needle placement in or near specific anatomical locations not easily accessible without such assistance. Exposure Time: Please see nurses notes for exact fluoroscopy time. Contrast: Before injecting any contrast, we confirmed that the patient did not have an allergy to iodine, shellfish, or radiological contrast. Once satisfactory needle placement was completed, radiological contrast was injected under continuous fluoroscopic guidance. Injection of contrast accomplished without complications. See chart for type and volume of contrast used. Fluoroscopic Guidance: I was personally present in the fluoroscopy suite, where the patient was placed in position for the procedure, over the fluoroscopy-compatible table. Fluoroscopy was manipulated, using "Tunnel Vision Technique", to obtain the best possible view of the target area, on the affected side. Parallax error was corrected before commencing the procedure. A "direction-depth-direction" technique was used to introduce the needle under continuous pulsed fluoroscopic guidance. Once the target was reached, antero-posterior, oblique, and lateral fluoroscopic projection views were taken to confirm needle placement in all planes. Electronic images uploaded into EMR.  Interpretation: Successful epidural injection. Intraoperative imaging interpretation by  performing Physician.    Post-op assessment  Post-procedure Vital Signs:  Pulse/HCG Rate: 7874 Temp: 97.8 F (36.6 C) Resp: 12 BP: (!) 128/96 SpO2: 98 %  EBL: None  Complications: No immediate post-treatment complications observed by team, or reported by patient.  Note: The patient tolerated the entire procedure well. A repeat set of vitals were taken after the procedure and the patient was kept under observation following institutional policy, for this type of procedure. Post-procedural neurological assessment was performed, showing return to baseline, prior to discharge. The patient was provided with post-procedure discharge instructions, including a section on how to identify potential problems. Should any problems arise concerning this procedure, the patient was given instructions to immediately contact us, at any time, without hesitation. In any case, we plan to contact the patient by telephone for a follow-up status report regarding this interventional procedure.  Comments:  No additional relevant information.   5 out of 5 strength bilateral upper extremity: Shoulder abduction, elbow flexion, elbow extension, thumb extension.   Plan of care    Medications administered: We administered iohexol, lidocaine, sodium chloride flush, ropivacaine (PF) 2 mg/mL (0.2%), and dexamethasone.   Requested Prescriptions   Signed Prescriptions Disp Refills   gabapentin (NEURONTIN) 300 MG capsule 60 capsule 2    Sig: Take 1 capsule (300 mg total) by mouth 2 (two) times daily.    Follow-up plan:   Return in about 4 weeks (around 08/28/2021) for Post Procedure Evaluation, virtual.      T1/T2 ESI 07/31/21   Recent Visits Date Type Provider Dept  07/11/21 Office Visit Gillis Santa, MD [facility]  Showing recent visits within past 90 days and meeting all other requirements Today's Visits Date Type Provider Dept  07/31/21 Procedure visit Gillis Santa, MD [facility]   Showing today's visits and meeting all other requirements Future Appointments  Date Type Provider Dept  09/06/21 Appointment Gillis Santa, MD [facility]  Showing future appointments within next 90 days and meeting all other requirements   Disposition: Discharge home  Discharge (Date  Time): 07/31/2021; 1404 hrs.   Primary Care Physician: Lesleigh Noe, MD Location: [facility] Note by: Gillis Santa, MD Date: 07/31/2021; Time: 2:12 PM  DISCLAIMER: Medicine is not an exact science. It has no guarantees or warranties. The decision to proceed with this intervention was based on the information collected from the patient. Conclusions were drawn from the patient's questionnaire, interview, and examination. Because information was provided in large part by the patient, it cannot be guaranteed that it has not been purposely or unconsciously manipulated or altered. Every effort has been made to obtain as much accurate, relevant, available data as possible. Always take into account that the treatment will also be dependent on availability of resources and existing treatment guidelines, considered by other Pain Management Specialists as being common knowledge and practice, at the time of the intervention. It is also important to point out that variation in procedural techniques and pharmacological choices are the acceptable norm. For Medico-Legal review purposes, the indications, contraindications, technique, and results of the these procedures should only be evaluated, judged and interpreted by a Board-Certified Interventional Pain Specialist with extensive familiarity and expertise in the same exact procedure and technique.

## 2021-07-31 NOTE — Telephone Encounter (Signed)
Blanch Media, we give it to him when he gets here.

## 2021-08-01 ENCOUNTER — Telehealth: Payer: Self-pay | Admitting: *Deleted

## 2021-08-01 NOTE — Telephone Encounter (Signed)
Attempted to call for post procedure follow-up. Patient answered, but was unable to hear.

## 2021-08-02 ENCOUNTER — Ambulatory Visit: Payer: BLUE CROSS/BLUE SHIELD

## 2021-08-07 ENCOUNTER — Ambulatory Visit: Payer: BLUE CROSS/BLUE SHIELD | Admitting: Family Medicine

## 2021-08-07 ENCOUNTER — Ambulatory Visit: Payer: BLUE CROSS/BLUE SHIELD

## 2021-08-07 ENCOUNTER — Encounter: Payer: Self-pay | Admitting: Family Medicine

## 2021-08-07 VITALS — BP 124/80 | HR 84 | Temp 97.5°F | Ht 64.0 in | Wt 183.1 lb

## 2021-08-07 DIAGNOSIS — E7800 Pure hypercholesterolemia, unspecified: Secondary | ICD-10-CM

## 2021-08-07 DIAGNOSIS — E782 Mixed hyperlipidemia: Secondary | ICD-10-CM

## 2021-08-07 DIAGNOSIS — Z0000 Encounter for general adult medical examination without abnormal findings: Secondary | ICD-10-CM | POA: Diagnosis not present

## 2021-08-07 MED ORDER — ATORVASTATIN CALCIUM 20 MG PO TABS: 20.0000 mg | ORAL_TABLET | Freq: Every day | ORAL | 3 refills | Status: DC

## 2021-08-07 NOTE — Patient Instructions (Signed)
Consider Shingles Vaccine

## 2021-08-07 NOTE — Progress Notes (Signed)
Annual Exam   Chief Complaint:  Chief Complaint  Patient presents with   Annual Exam    History of Present Illness:  Jeremiah Carpenter is a 56 y.o. presents today for annual examination.     Nutrition/Lifestyle Diet: pretty good, but recently back from vacation  Exercise: not currently - walking at work frequently He is single partner, contraception - vasectomy.  Any issues with getting or keeping erection? No  Social History   Tobacco Use  Smoking Status Former  Smokeless Tobacco Never  Tobacco Comments   quit smoking 15+ yrs ago   Social History   Substance and Sexual Activity  Alcohol Use Yes   Alcohol/week: 0.0 standard drinks of alcohol   Comment: 2-3 times a week   Social History   Substance and Sexual Activity  Drug Use Not Currently   Comment: CBD - pain     Safety The patient wears seatbelts: yes.     The patient feels safe at home and in their relationships: yes.  General Health Dentist in the last year: Yes Eye doctor: yes  Weight Wt Readings from Last 3 Encounters:  08/07/21 183 lb 2 oz (83.1 kg)  07/31/21 178 lb (80.7 kg)  03/30/21 178 lb (80.7 kg)   Patient has high BMI  BMI Readings from Last 1 Encounters:  08/07/21 31.43 kg/m     Chronic disease screening Blood pressure monitoring:  BP Readings from Last 3 Encounters:  08/07/21 124/80  07/31/21 (!) 128/96  03/30/21 (!) 129/97    Lipid Monitoring: Indication for screening: age >35, obesity, diabetes, family hx, CV risk factors.  Lipid screening: Yes  Lab Results  Component Value Date   CHOL 200 07/11/2020   HDL 43.40 07/11/2020   LDLCALC 134 (H) 04/21/2018   LDLDIRECT 109.0 07/11/2020   TRIG 314.0 (H) 07/11/2020   CHOLHDL 5 07/11/2020     Diabetes Screening: age >70, overweight, family hx, PCOS, hx of gestational diabetes, at risk ethnicity, elevated blood pressure >135/80.  Diabetes Screening screening: Not Indicated  Lab Results  Component Value Date   HGBA1C  5.4 09/03/2011     Prostate Cancer Screening: Yes Age 77-69 yo Shared Decision Making Higher Risk: Older age, African American, Family Hx of Prostate Cancer - Yes Benefits: screening may prevent 1.3 deaths from prostate cancer over 13 years per 1000 men screened and prevent 3 metastatic cases per 1000 men screened. Not enough evidence to support more benefit for AA or [place] Harms: False Positive and psychological harms. 15% of me with false positive over a 2 to 4 year period > resulting in biopsy and complications such as pain, hematospermia, infections. Overdiagnosis - increases with age - found that 20-50% of prostate cancer through screening may have never caused any issues. Harms of treatment include - erectile dysfunction, urinary incontinence, and bothersome bowel symptoms.   After discussion he does not want to get a PSA checked today.   Inadequate evidence for screening <55 No mortality benefit for screening >70   Lab Results  Component Value Date   PSA 1.84 10/22/2018   PSA 1.65 04/21/2018   PSA 1.54 06/03/2017       Colon Cancer Screening:  Age 68-75 yo - benefits outweigh the risk. Adults 36-85 yo who have never been screened benefit.  Benefits: 134000 people in 2016 will be diagnosed and 49,000 will die - early detection helps Harms: Complications 2/2 to colonoscopy High Risk (Colonoscopy): genetic disorder (Lynch syndrome or familial adenomatous polyposis), personal hx  of IBD, previous adenomatous polyp, or previous colorectal cancer, FamHx start 10 years before the age at diagnosis, increased in males and black race  Options:  FIT - looks for hemoglobin (blood in the stool) - specific and fairly sensitive - must be done annually Cologuard - looks for DNA and blood - more sensitive - therefore can have more false positives, every 3 years Colonoscopy - every 10 years if normal - sedation, bowl prep, must have someone drive you  Shared decision making and the patient had  decided to do colonoscopy due in 2026.   Social History   Tobacco Use  Smoking Status Former  Smokeless Tobacco Never  Tobacco Comments   quit smoking 15+ yrs ago    [place] (Ages 61-95): not applicable  Abdominal Aortic Aneurysm:  Age 41-75, 1 time screening, men who have ever smoked not old enough    Past Medical History:  Diagnosis Date   History of colon polyps    benign   History of kidney stones    History of migraine    last one about a month ago   Hyperlipidemia    takes Atorvastatin daily   Joint pain    Weakness    numbness mainly left hand and rarely on right    Past Surgical History:  Procedure Laterality Date   ANTERIOR CERVICAL DECOMP/DISCECTOMY FUSION N/A 01/11/2016   Procedure: C5-6, C6-7 Anterior Cervical Discectomy and Fusion, Allograft, Plate;  Surgeon: Marybelle Killings, MD;  Location: [facility];  Service: Orthopedics;  Laterality: N/A;   CARPAL TUNNEL RELEASE Bilateral    COLONOSCOPY     COLONOSCOPY WITH PROPOFOL N/A 01/25/2020   Procedure: COLONOSCOPY WITH PROPOFOL;  Surgeon: Jonathon Bellows, MD;  Location: [facility] ENDOSCOPY;  Service: Gastroenterology;  Laterality: N/A;   VASECTOMY      Prior to Admission medications   Medication Sig Start Date End Date Taking? Authorizing Provider  atorvastatin (LIPITOR) 20 MG tablet Take 1 tablet (20 mg total) by mouth daily. 07/20/21  Yes Lesleigh Noe, MD  gabapentin (NEURONTIN) 300 MG capsule Take 1 capsule (300 mg total) by mouth 2 (two) times daily. 07/31/21 10/29/21 Yes Gillis Santa, MD    Allergies  Allergen Reactions   Amoxicillin Hives and Rash    Has patient had a PCN reaction causing immediate rash, facial/tongue/throat swelling, SOB or lightheadedness with hypotension: no Has patient had a PCN reaction causing severe rash involving mucus membranes or skin necrosis:no Has patient had a PCN reaction that required hospitalization no Has patient had a PCN reaction occurring within the last 10 years:  yes If all of the above answers are "NO", then may proceed with Cephalosporin use.     Social History   Socioeconomic History   Marital status: Married    Spouse name: [name]   Number of children: 3   Years of education: some college   Highest education level: Not on file  Occupational History   Not on file  Tobacco Use   Smoking status: Former   Smokeless tobacco: Never   Tobacco comments:    quit smoking 15+ yrs ago  Vaping Use   Vaping Use: Never used  Substance and Sexual Activity   Alcohol use: Yes    Alcohol/week: 0.0 standard drinks of alcohol    Comment: 2-3 times a week   Drug use: Not Currently    Comment: CBD - pain   Sexual activity: Yes    Birth control/protection: Post-menopausal, Surgical  Other Topics  Concern   Not on file  Social History Narrative   12/03/19   From: [place], moved in 1994 to [place]   Living: with wife, [name] (1996)   Work: Adult nurse      Family: 3 adults daughters - [place], [place], Carrolyn Meiers       Enjoys: traveling, kayaking, fishing      Exercise: walking a lot - up and down ladders   Diet: pretty good      Safety   Seat belts: Yes    Guns: Yes  and secure   Safe in relationships: Yes    Social Determinants of Radio broadcast assistant Strain: Not on file  Food Insecurity: Not on file  Transportation Needs: Not on file  Physical Activity: Not on file  Stress: Not on file  Social Connections: Not on file  Intimate Partner Violence: Not on file    Family History  Problem Relation Age of Onset   Lung cancer Mother        lung   Hypertension Father    Hyperlipidemia Father    Heart attack Father 73       bypass surgery   Cancer Half-Brother        unsure what type   Colon cancer Neg Hx     Review of Systems  Constitutional:  Negative for chills and fever.  HENT:  Negative for congestion and sore throat.   Eyes:  Negative for blurred vision and double vision.  Respiratory:  Negative for shortness of  breath.   Cardiovascular:  Negative for chest pain.  Gastrointestinal:  Negative for heartburn, nausea and vomiting.  Genitourinary: Negative.   Musculoskeletal:  Positive for neck pain. Negative for back pain and myalgias.  Skin:  Negative for rash.  Neurological:  Negative for dizziness and headaches.  Endo/Heme/Allergies:  Does not bruise/bleed easily.  Psychiatric/Behavioral:  Negative for depression. The patient is not nervous/anxious.      Physical Exam BP 124/80   Pulse 84   Temp (!) 97.5 F (36.4 C) (Temporal)   Ht '5\' 4"'$  (1.626 m)   Wt 183 lb 2 oz (83.1 kg)   SpO2 98%   BMI 31.43 kg/m    BP Readings from Last 3 Encounters:  08/07/21 124/80  07/31/21 (!) 128/96  03/30/21 (!) 129/97      Physical Exam Constitutional:      General: He is not in acute distress.    Appearance: He is well-developed. He is not diaphoretic.  HENT:     Head: Normocephalic and atraumatic.     Right Ear: Tympanic membrane and ear canal normal.     Left Ear: Tympanic membrane and ear canal normal.     Nose: Nose normal.     Mouth/Throat:     Pharynx: Uvula midline.  Eyes:     General: No scleral icterus.    Conjunctiva/sclera: Conjunctivae normal.     Pupils: Pupils are equal, round, and reactive to light.  Cardiovascular:     Rate and Rhythm: Normal rate and regular rhythm.     Heart sounds: Normal heart sounds. No murmur heard. Pulmonary:     Effort: Pulmonary effort is normal. No respiratory distress.     Breath sounds: Normal breath sounds. No wheezing.  Abdominal:     General: Bowel sounds are normal. There is no distension.     Palpations: Abdomen is soft. There is no mass.     Tenderness: There is no abdominal tenderness. There is no  guarding.  Musculoskeletal:        General: Normal range of motion.     Cervical back: Normal range of motion and neck supple.  Lymphadenopathy:     Cervical: No cervical adenopathy.  Skin:    General: Skin is warm and dry.     Capillary  Refill: Capillary refill takes less than 2 seconds.  Neurological:     Mental Status: He is alert and oriented to person, place, and time.        Results:  PHQ-9:     12/03/2019    9:29 AM 04/21/2018   11:20 AM 06/03/2017    8:06 AM  Depression screen PHQ 2/9  Decreased Interest 0 0 0  Down, Depressed, Hopeless 0 0 0  PHQ - 2 Score 0 0 0      Assessment: 56 y.o. here for routine annual physical examination.  Plan: Problem List Items Addressed This Visit       Other   HYPERCHOLESTEROLEMIA - Primary   Relevant Medications   atorvastatin (LIPITOR) 20 MG tablet   Other Relevant Orders   Comprehensive metabolic panel   Lipid panel   Other Visit Diagnoses     Annual physical exam       Mixed hyperlipidemia       Relevant Medications   atorvastatin (LIPITOR) 20 MG tablet       Screening: -- Blood pressure screen normal -- cholesterol screening: will obtain -- Weight screening: overweight: continue to monitor -- Diabetes Screening: will obtain -- Nutrition: normal - Encouraged healthy diet  The 10-year ASCVD risk score (Arnett DK, et al., 2019) is: 7.1%   Values used to calculate the score:     Age: 43 years     Sex: Male     Is Non-Hispanic African American: No     Diabetic: No     Tobacco smoker: No     Systolic Blood Pressure: 867 mmHg     Is BP treated: No     HDL Cholesterol: 43.4 mg/dL     Total Cholesterol: 200 mg/dL  -- ASA 81 mg discussed if CVD risk >10% age 30-59 and willing to take for 10 years -- Statin therapy for Age 60-75 with CVD risk >7.5%  Psych -- Depression screening (PHQ-9): negative  Safety -- tobacco screening: not using -- alcohol screening:  low-risk usage. -- no evidence of domestic violence or intimate partner violence.  Cancer Screening -- Prostate (age 66-69)  declined -- Colon (age 61-75)  up to date -- Lung not indicated   Immunizations Immunization History  Administered Date(s) Administered    Influenza,inj,Quad PF,6+ Mos 02/13/2021   PFIZER(Purple Top)SARS-COV-2 Vaccination 05/12/2019, 06/02/2019   Tdap 06/03/2017   Yellow Fever 01/12/2018    -- flu vaccine up to date -- TDAP q10 years up to date -- Shingles (age >62) not up to date - he will consider  -- Covid-19 Vaccine up to date  Encouraged regular vision and dental screening. Encouraged healthy exercise and diet.   Lesleigh Noe

## 2021-08-08 LAB — COMPREHENSIVE METABOLIC PANEL
ALT: 21 U/L (ref 0–53)
AST: 16 U/L (ref 0–37)
Albumin: 4.3 g/dL (ref 3.5–5.2)
Alkaline Phosphatase: 52 U/L (ref 39–117)
BUN: 22 mg/dL (ref 6–23)
CO2: 30 mEq/L (ref 19–32)
Calcium: 9.5 mg/dL (ref 8.4–10.5)
Chloride: 103 mEq/L (ref 96–112)
Creatinine, Ser: 1.03 mg/dL (ref 0.40–1.50)
GFR: 80.5 mL/min (ref >60.00)
Glucose, Bld: 91 mg/dL (ref 70–99)
Potassium: 4.3 mEq/L (ref 3.5–5.1)
Sodium: 140 mEq/L (ref 135–145)
Total Bilirubin: 0.6 mg/dL (ref 0.2–1.2)
Total Protein: 6.7 g/dL (ref 6.0–8.3)

## 2021-08-08 LAB — LIPID PANEL
Cholesterol: 188 mg/dL (ref 0–200)
HDL: 44 mg/dL (ref >39.00)
NonHDL: 143.55
Total CHOL/HDL Ratio: 4
Triglycerides: 348 mg/dL — ABNORMAL HIGH (ref 0.0–149.0)
VLDL: 69.6 mg/dL — ABNORMAL HIGH (ref 0.0–40.0)

## 2021-08-08 LAB — LDL CHOLESTEROL, DIRECT: Direct LDL: 110 mg/dL

## 2021-08-09 ENCOUNTER — Ambulatory Visit: Payer: BLUE CROSS/BLUE SHIELD

## 2021-08-09 ENCOUNTER — Other Ambulatory Visit: Payer: Self-pay | Admitting: Family Medicine

## 2021-08-09 DIAGNOSIS — E7800 Pure hypercholesterolemia, unspecified: Secondary | ICD-10-CM

## 2021-08-09 MED ORDER — ATORVASTATIN CALCIUM 40 MG PO TABS: 40.0000 mg | ORAL_TABLET | Freq: Every day | ORAL | 3 refills | Status: DC

## 2021-08-14 ENCOUNTER — Ambulatory Visit: Payer: BLUE CROSS/BLUE SHIELD

## 2021-08-17 ENCOUNTER — Ambulatory Visit: Payer: BLUE CROSS/BLUE SHIELD

## 2021-08-21 ENCOUNTER — Ambulatory Visit: Payer: BLUE CROSS/BLUE SHIELD

## 2021-08-24 ENCOUNTER — Ambulatory Visit: Payer: BLUE CROSS/BLUE SHIELD | Admitting: Physical Therapy

## 2021-08-28 ENCOUNTER — Ambulatory Visit: Payer: BLUE CROSS/BLUE SHIELD

## 2021-08-30 ENCOUNTER — Ambulatory Visit: Payer: BLUE CROSS/BLUE SHIELD | Admitting: Physical Therapy

## 2021-09-04 ENCOUNTER — Ambulatory Visit: Payer: BLUE CROSS/BLUE SHIELD

## 2021-09-05 ENCOUNTER — Encounter: Payer: Self-pay | Admitting: Student in an Organized Health Care Education/Training Program

## 2021-09-06 ENCOUNTER — Ambulatory Visit: Payer: BLUE CROSS/BLUE SHIELD

## 2021-09-06 ENCOUNTER — Ambulatory Visit
Payer: Commercial Managed Care - PPO | Attending: Student in an Organized Health Care Education/Training Program | Admitting: Student in an Organized Health Care Education/Training Program

## 2021-09-06 ENCOUNTER — Encounter: Payer: Self-pay | Admitting: Student in an Organized Health Care Education/Training Program

## 2021-09-06 DIAGNOSIS — Z981 Arthrodesis status: Secondary | ICD-10-CM

## 2021-09-06 DIAGNOSIS — M47812 Spondylosis without myelopathy or radiculopathy, cervical region: Secondary | ICD-10-CM | POA: Diagnosis not present

## 2021-09-06 DIAGNOSIS — M5412 Radiculopathy, cervical region: Secondary | ICD-10-CM

## 2021-09-06 DIAGNOSIS — M542 Cervicalgia: Secondary | ICD-10-CM

## 2021-09-06 DIAGNOSIS — G894 Chronic pain syndrome: Secondary | ICD-10-CM

## 2021-09-06 DIAGNOSIS — M4312 Spondylolisthesis, cervical region: Secondary | ICD-10-CM | POA: Diagnosis not present

## 2021-09-06 NOTE — Progress Notes (Signed)
Patient: Jeremiah Carpenter  Service Category: E/M  Provider: Gillis Santa, MD  DOB: 1965/03/30  DOS: 09/06/2021  Location: Office  MRN: [id number]  Setting: Ambulatory outpatient  Referring Provider: Lesleigh Noe, MD  Type: Established Patient  Specialty: Interventional Pain Management  PCP: Lesleigh Noe, MD  Location: Remote location  Delivery: TeleHealth     Virtual Encounter - Pain Management PROVIDER NOTE: Information contained herein reflects review and annotations entered in association with encounter. Interpretation of such information and data should be left to medically-trained personnel. Information provided to patient can be located elsewhere in the medical record under "Patient Instructions". Document created using STT-dictation technology, any transcriptional errors that may result from process are unintentional.    Contact & Pharmacy Preferred: [phone] Home: [phone] (home) Mobile: [phone] (mobile) E-mail: [email or web address]  [place], [place] - [address], [address], [place] [address] Phone: [phone] Fax: [phone]  [facility] ([place], [place] [facility] [place] [address] [place] [address] Phone: [phone] Fax: [phone]  [id number] [place], [place] [address] [place] [address] Phone: [phone] Fax: [phone]   Pre-screening  Mr. Lips offered "in-person" vs "virtual" encounter. He indicated preferring virtual for this encounter.   Reason COVID-19*  Social distancing based on CDC and AMA recommendations.   I contacted ZEPHANIAH LUBRANO on 09/06/2021 via telephone.      I clearly identified myself as Gillis Santa, MD. I verified that I was speaking with the correct person using two identifiers (Name: MEHDI GIRONDA, and date of birth: 01-08-1966).  Consent I  sought verbal advanced consent from [name] for virtual visit interactions. I informed Mr. Kassis of possible security and privacy concerns, risks, and limitations associated with providing "not-in-person" medical evaluation and management services. I also informed Mr. Iannello of the availability of "in-person" appointments. Finally, I informed him that there would be a charge for the virtual visit and that he could be  personally, fully or partially, financially responsible for it. Mr. Schmutz expressed understanding and agreed to proceed.   Historic Elements   Mr. ANTONI STEFAN is a 56 y.o. year old, male patient evaluated today after our last contact on 07/31/2021. Mr. Machnik  has a past medical history of History of colon polyps, History of kidney stones, History of migraine, Hyperlipidemia, Joint pain, and Weakness. He also  has a past surgical history that includes Carpal tunnel release (Bilateral); Vasectomy; Colonoscopy; Anterior cervical decomp/discectomy fusion (N/A, 01/11/2016); and Colonoscopy with propofol (N/A, 01/25/2020). Mr. Hudnall has a current medication list which includes the following prescription(s): atorvastatin and gabapentin. He  reports that he has quit smoking. He has never used smokeless tobacco. He reports current alcohol use. He reports that he does not currently use drugs. Mr. Nicklaus is allergic to amoxicillin.   HPI  Today, he is being contacted for a post-procedure assessment.   Post-procedure evaluation   Type: Cervical Epidural Steroid injection (ESI) (Interlaminar) #1  Laterality: Left  Level: T1-2 Imaging: Fluoroscopy-assisted DOS: 07/31/2021  Performed by: Gillis Santa, MD Anesthesia: Local anesthesia (1-2% Lidocaine) Anxiolysis: None                 Sedation: None.   Purpose: Diagnostic/Therapeutic Indications: Cervicalgia, cervical radicular pain, degenerative disc disease, severe enough to impact quality of life or function. 1.  Cervicalgia   2. Cervical radicular  pain   3. Anterolisthesis of cervical spine (C3-C4)   4. S/P cervical spinal fusion (C5-C7)   5. Chronic pain syndrome    NAS-11 score:   Pre-procedure: 6 /10   Post-procedure: 5 /10     Effectiveness:  Initial hour after procedure: 100 %  Subsequent 4-6 hours post-procedure: 100 %  Analgesia past initial 6 hours: 20 %  Ongoing improvement:  Analgesic:  25-30%, patient states that his radiating pain into his left arm has improved however he is now having more neck pain as well as radiating shoulder pain and even occipital headaches.  I reviewed his cervical MRI which shows a left paracentral disc osteophyte at C2-C3 along with right-sided facet arthrosis, there is moderate right-sided facet arthrosis at C3-C4 and mild facet hypertrophy at C4-C5.  Based upon these findings, I have offered the patient diagnostic cervical facet medial branch nerve blocks bilaterally at C3, C4, C5.  Risks and benefits reviewed and patient would like to proceed. Function: Somewhat improved ROM: Somewhat improved   Laboratory Chemistry Profile   Renal Lab Results  Component Value Date   BUN 22 08/07/2021   CREATININE 1.03 08/07/2021   GFR 80.50 08/07/2021   GFRAA >60 01/04/2016   GFRNONAA >60 01/04/2016    Hepatic Lab Results  Component Value Date   AST 16 08/07/2021   ALT 21 08/07/2021   ALBUMIN 4.3 08/07/2021   ALKPHOS 52 08/07/2021    Electrolytes Lab Results  Component Value Date   NA 140 08/07/2021   K 4.3 08/07/2021   CL 103 08/07/2021   CALCIUM 9.5 08/07/2021    Bone Lab Results  Component Value Date   TESTOFREE 50.9 10/22/2018   TESTOSTERONE 334 10/22/2018    Inflammation (CRP: Acute Phase) (ESR: Chronic Phase) No results found for: "CRP", "ESRSEDRATE", "LATICACIDVEN"       Note: Above Lab results reviewed.  Imaging  DG PAIN CLINIC C-ARM 1-60 MIN NO REPORT Fluoro was used, but no Radiologist interpretation will be provided.  Please  refer to "NOTES" tab for provider progress note.  Assessment  The primary encounter diagnosis was Cervical facet joint syndrome. Diagnoses of Cervical spondylosis, Anterolisthesis of cervical spine (C3-C4), S/P cervical spinal fusion (C5-C7), Cervical radicular pain, Cervicalgia, and Chronic pain syndrome were also pertinent to this visit.  Plan of Care    Postprocedural follow-up status post T1-T2 ESI.  Patient states that his overall neck pain has improved.  He states that his radiating pain into his left arm is less.  He is having increased radiating left shoulder pain as well as right posterior dominant headaches.  We reviewed his cervical MRI in detail again which shows a left paracentral disc osteophyte at C2-C3 along with right-sided facet arthrosis, there is moderate right-sided facet arthrosis at C3-C4 and mild facet hypertrophy at C4-C5.  Based upon these findings, I have offered the patient diagnostic cervical facet medial branch nerve blocks bilaterally at C3, C4, C5.  Risks and benefits reviewed and patient would like to proceed.  Depending upon his response from his diagnostic cervical facet medial branch nerve blocks, possibly consider a cervical RFA for the purpose of obtaining long-term pain relief.  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  We will try interventional therapies as above and if these are not helpful from an analgesic and functional standpoint, we will initiate referral to neurosurgery.  Patient endorsed understanding and is in agreement with  treatment plan.  Mr. WAYNE WICKLUND has a current medication list which includes the following long-term medication(s): atorvastatin and gabapentin.  Pharmacotherapy (Medications Ordered): No orders of the defined types were placed in this encounter.  Orders:  Orders Placed This Encounter  Procedures   CERVICAL FACET  (MEDIAL BRANCH NERVE BLOCK)     Standing Status:   Future    Standing Expiration Date:   10/07/2021    Scheduling Instructions:     Side: Bilateral     Level: C3-4, C4-5, C5-6 Facet joints (C3, C4, C5, Medial Branch Nerves)     Sedation: Patient's choice.     Timeframe: As soon as schedule allows    Order Specific Question:   Where will this procedure be performed?    Answer:   [facility]   Follow-up plan:   Return in about 4 weeks (around 10/04/2021) for Bilateral C3, C4, C5 medial branch nerve block.     T1/T2 ESI 07/31/21    Recent Visits Date Type Provider Dept  07/31/21 Procedure visit Gillis Santa, MD [facility]  07/11/21 Office Visit Gillis Santa, MD [facility]  Showing recent visits within past 90 days and meeting all other requirements Today's Visits Date Type Provider Dept  09/06/21 Office Visit Gillis Santa, MD [facility]  Showing today's visits and meeting all other requirements Future Appointments No visits were found meeting these conditions. Showing future appointments within next 90 days and meeting all other requirements  I discussed the assessment and treatment plan with the patient. The patient was provided an opportunity to ask questions and all were answered. The patient agreed with the plan and demonstrated an understanding of the instructions.  Patient advised to call back or seek an in-person evaluation if the symptoms or condition worsens.  Duration of encounter: 67minutes.  Note by: Gillis Santa, MD Date: 09/06/2021; Time: 2:24 PM

## 2021-09-11 ENCOUNTER — Ambulatory Visit: Payer: BLUE CROSS/BLUE SHIELD

## 2021-09-13 ENCOUNTER — Ambulatory Visit: Payer: BLUE CROSS/BLUE SHIELD

## 2021-09-18 ENCOUNTER — Ambulatory Visit: Payer: BLUE CROSS/BLUE SHIELD

## 2021-09-20 ENCOUNTER — Ambulatory Visit: Payer: BLUE CROSS/BLUE SHIELD | Admitting: Physical Therapy

## 2021-10-04 ENCOUNTER — Encounter: Payer: Self-pay | Admitting: Student in an Organized Health Care Education/Training Program

## 2021-10-05 ENCOUNTER — Encounter: Payer: Self-pay | Admitting: *Deleted

## 2021-10-05 ENCOUNTER — Other Ambulatory Visit: Payer: Self-pay | Admitting: Student in an Organized Health Care Education/Training Program

## 2021-10-05 DIAGNOSIS — M47812 Spondylosis without myelopathy or radiculopathy, cervical region: Secondary | ICD-10-CM

## 2021-10-25 ENCOUNTER — Ambulatory Visit
Admission: RE | Admit: 2021-10-25 | Discharge: 2021-10-25 | Disposition: A | Discharge status: Home / Self Care | Payer: BLUE CROSS/BLUE SHIELD | Source: Ambulatory Visit | Attending: Student in an Organized Health Care Education/Training Program | Admitting: Student in an Organized Health Care Education/Training Program

## 2021-10-25 ENCOUNTER — Ambulatory Visit
Payer: BLUE CROSS/BLUE SHIELD | Attending: Student in an Organized Health Care Education/Training Program | Admitting: Student in an Organized Health Care Education/Training Program

## 2021-10-25 ENCOUNTER — Encounter: Payer: Self-pay | Admitting: Student in an Organized Health Care Education/Training Program

## 2021-10-25 VITALS — BP 134/103 | HR 73 | Temp 97.4°F | Resp 18 | Ht 65.0 in | Wt 180.0 lb

## 2021-10-25 DIAGNOSIS — M47812 Spondylosis without myelopathy or radiculopathy, cervical region: Secondary | ICD-10-CM | POA: Diagnosis not present

## 2021-10-25 DIAGNOSIS — G894 Chronic pain syndrome: Secondary | ICD-10-CM | POA: Diagnosis not present

## 2021-10-25 DIAGNOSIS — M542 Cervicalgia: Secondary | ICD-10-CM | POA: Diagnosis not present

## 2021-10-25 MED ORDER — DEXAMETHASONE SODIUM PHOSPHATE 10 MG/ML IJ SOLN
INTRAMUSCULAR | Status: AC
  Filled 2021-10-25: qty 1

## 2021-10-25 MED ORDER — SODIUM CHLORIDE 0.9% FLUSH: 1.0000 mL | Freq: Once | INTRAVENOUS | Status: DC

## 2021-10-25 MED ORDER — MIDAZOLAM HCL 5 MG/5ML IJ SOLN
0.5000 mg | Freq: Once | INTRAMUSCULAR | Status: AC
  Administered 2021-10-25: 1.5 mg via INTRAVENOUS
  Filled 2021-10-25: qty 5

## 2021-10-25 MED ORDER — LIDOCAINE HCL 2 % IJ SOLN
20.0000 mL | Freq: Once | INTRAMUSCULAR | Status: AC
  Administered 2021-10-25: 400 mg
  Filled 2021-10-25: qty 40

## 2021-10-25 MED ORDER — LACTATED RINGERS IV SOLN: Freq: Once | INTRAVENOUS | Status: AC

## 2021-10-25 MED ORDER — DEXAMETHASONE SODIUM PHOSPHATE 10 MG/ML IJ SOLN
10.0000 mg | Freq: Once | INTRAMUSCULAR | Status: AC
  Administered 2021-10-25: 20 mg
  Filled 2021-10-25: qty 1

## 2021-10-25 MED ORDER — ROPIVACAINE HCL 2 MG/ML IJ SOLN
1.0000 mL | Freq: Once | INTRAMUSCULAR | Status: AC
  Administered 2021-10-25: 2 mL via EPIDURAL
  Filled 2021-10-25: qty 20

## 2021-10-25 NOTE — Patient Instructions (Signed)

## 2021-10-25 NOTE — Progress Notes (Signed)
PROVIDER NOTE: Interpretation of information contained herein should be left to medically-trained personnel. Specific patient instructions are provided elsewhere under "Patient Instructions" section of medical record. This document was created in part using STT-dictation technology, any transcriptional errors that may result from this process are unintentional.  Patient: Jeremiah Carpenter Type: Established DOB: 1965/07/15 MRN: [id number] PCP: Lesleigh Noe, MD  Service: Procedure DOS: 10/25/2021 Setting: Ambulatory Location: Ambulatory outpatient facility Delivery: Face-to-face Provider: Gillis Santa, MD Specialty: Interventional Pain Management Specialty designation: 09 Location: Outpatient facility Ref. Prov.: Lesleigh Noe, MD    Primary Reason for Visit: Interventional Pain Management Treatment. CC: Neck Pain   Procedure:           Type: Cervical Facet Medial Branch Block(s) #1  Laterality: Bilateral  Level: C3, C4, C5, Medial Branch Level(s). Injecting these levels blocks the C3-4, C4-5,  cervical facet joints.  Imaging: Fluoroscopic guidance Anesthesia: Local anesthesia (1-2% Lidocaine) Sedation: Minimal Sedation              .  DOS: 10/25/2021  Performed by: Gillis Santa, MD  Purpose: Diagnostic/Therapeutic Indications: Cervicalgia (cervical spine axial pain) severe enough to impact quality of life or function. 1. Cervical facet joint syndrome   2. Cervical spondylosis   3. Cervicalgia   4. Chronic pain syndrome    NAS-11 Pain score:   Pre-procedure: 5 /10   Post-procedure: 0-No pain/10     Position / Prep / Materials:  Position: Prone. Head in cradle. C-spine slightly flexed. Prep solution: DuraPrep (Iodine Povacrylex [0.7% available iodine] and Isopropyl Alcohol, 74% w/w) Prep Area: Posterior Cervico-thoracic Region. From occipital ridge to tip of scapula, and from shoulder to shoulder. Entire posterior and lateral neck surface. Materials:  Tray:  Block Needle(s):  Type: Spinal  Gauge (G): 22"  Length: 3.5-in  Qty: 3  Pre-op H&P Assessment:  Jeremiah Carpenter is a 56 y.o. (year old), male patient, seen today for interventional treatment. He  has a past surgical history that includes Carpal tunnel release (Bilateral); Vasectomy; Colonoscopy; Anterior cervical decomp/discectomy fusion (N/A, 01/11/2016); and Colonoscopy with propofol (N/A, 01/25/2020). Jeremiah Carpenter has a current medication list which includes the following prescription(s): atorvastatin and gabapentin, and the following Facility-Administered Medications: lactated ringers and sodium chloride flush. His primarily concern today is the Neck Pain  Initial Vital Signs:  Pulse/HCG Rate: 73ECG Heart Rate: 72 Temp:  (!) 97.4 F (36.3 C) Resp: 16 BP:  (!) 153/98 SpO2: 99 %  BMI: Estimated body mass index is 29.95 kg/m as calculated from the following:   Height as of this encounter: '5\' 5"'$  (1.651 m).   Weight as of this encounter: 180 lb (81.6 kg).  Risk Assessment: Allergies: Reviewed. He is allergic to amoxicillin.  Allergy Precautions: None required Coagulopathies: Reviewed. None identified.  Blood-thinner therapy: None at this time Active Infection(s): Reviewed. None identified. Jeremiah Carpenter is afebrile  Site Confirmation: Jeremiah Carpenter was asked to confirm the procedure and laterality before marking the site Procedure checklist: Completed Consent: Before the procedure and under the influence of no sedative(s), amnesic(s), or anxiolytics, the patient was informed of the treatment options, risks and possible complications. To fulfill our ethical and legal obligations, as recommended by the American Medical Association's Code of Ethics, I have informed the patient of my clinical impression; the nature and purpose of the treatment or procedure; the risks, benefits, and possible complications of the intervention; the alternatives, including doing nothing; the risk(s) and  benefit(s) of the alternative treatment(s) or procedure(s); and the  risk(s) and benefit(s) of doing nothing. The patient was provided information about the general risks and possible complications associated with the procedure. These may include, but are not limited to: failure to achieve desired goals, infection, bleeding, organ or nerve damage, allergic reactions, paralysis, and death. In addition, the patient was informed of those risks and complications associated to Spine-related procedures, such as failure to decrease pain; infection (i.e.: Meningitis, epidural or intraspinal abscess); bleeding (i.e.: epidural hematoma, subarachnoid hemorrhage, or any other type of intraspinal or peri-dural bleeding); organ or nerve damage (i.e.: Any type of peripheral nerve, nerve root, or spinal cord injury) with subsequent damage to sensory, motor, and/or autonomic systems, resulting in permanent pain, numbness, and/or weakness of one or several areas of the body; allergic reactions; (i.e.: anaphylactic reaction); and/or death. Furthermore, the patient was informed of those risks and complications associated with the medications. These include, but are not limited to: allergic reactions (i.e.: anaphylactic or anaphylactoid reaction(s)); adrenal axis suppression; blood sugar elevation that in diabetics may result in ketoacidosis or comma; water retention that in patients with history of congestive heart failure may result in shortness of breath, pulmonary edema, and decompensation with resultant heart failure; weight gain; swelling or edema; medication-induced neural toxicity; particulate matter embolism and blood vessel occlusion with resultant organ, and/or nervous system infarction; and/or aseptic necrosis of one or more joints. Finally, the patient was informed that Medicine is not an exact science; therefore, there is also the possibility of unforeseen or unpredictable risks and/or possible complications that may  result in a catastrophic outcome. The patient indicated having understood very clearly. We have given the patient no guarantees and we have made no promises. Enough time was given to the patient to ask questions, all of which were answered to the patient's satisfaction. Mr. Mcgue has indicated that he wanted to continue with the procedure. Attestation: I, the ordering provider, attest that I have discussed with the patient the benefits, risks, side-effects, alternatives, likelihood of achieving goals, and potential problems during recovery for the procedure that I have provided informed consent. Date  Time: 10/25/2021  8:37 AM  Pre-Procedure Preparation:  Monitoring: As per clinic protocol. Respiration, ETCO2, SpO2, BP, heart rate and rhythm monitor placed and checked for adequate function Safety Precautions: Patient was assessed for positional comfort and pressure points before starting the procedure. Time-out: I initiated and conducted the "Time-out" before starting the procedure, as per protocol. The patient was asked to participate by confirming the accuracy of the "Time Out" information. Verification of the correct person, site, and procedure were performed and confirmed by me, the nursing staff, and the patient. "Time-out" conducted as per Joint Commission's Universal Protocol (UP.01.01.01). Time: 0922  Description/Narrative of Procedure:          Laterality: Bilateral. The procedure was performed in identical fashion on both sides. Targeted Levels:  C3, C4, C5,  Medial Branch Level(s).  Rationale (medical necessity): procedure needed and proper for the diagnosis and/or treatment of the patient's medical symptoms and needs. Procedural Technique Safety Precautions: Aspiration looking for blood return was conducted prior to all injections. At no point did we inject any substances, as a needle was being advanced. No attempts were made at seeking any paresthesias. Safe injection practices and  needle disposal techniques used. Medications properly checked for expiration dates. SDV (single dose vial) medications used. Description of the Procedure: Protocol guidelines were followed. The patient was assisted into a comfortable position. The target area was identified and the area prepped in  the usual manner. Skin & deeper tissues infiltrated with local anesthetic. Appropriate amount of time allowed to pass for local anesthetics to take effect. The procedure needles were then advanced to the target area. Proper needle placement secured. Negative aspiration confirmed. Solution injected in intermittent fashion, asking for systemic symptoms every 0.5cc of injectate. The needles were then removed and the area cleansed, making sure to leave some of the prepping solution back to take advantage of its long term bactericidal properties.  Technical description of process:  C3 Medial Branch Nerve Block (MBB): The target area for the C3 dorsal medial articular branch is the lateral concave waist of the articular pillar of C3. Under fluoroscopic guidance, a Quincke needle was inserted until contact was made with os over the postero-lateral aspect of the articular pillar of C3 (target area). After negative aspiration for blood, 231m of the nerve block solution was injected without difficulty or complication. The needle was removed intact. C4 Medial Branch Nerve Block (MBB): The target area for the C4 dorsal medial articular branch is the lateral concave waist of the articular pillar of C4. Under fluoroscopic guidance, a Quincke needle was inserted until contact was made with os over the postero-lateral aspect of the articular pillar of C4 (target area). After negative aspiration for blood, 2331mof the nerve block solution was injected without difficulty or complication. The needle was removed intact. C5 Medial Branch Nerve Block (MBB): The target area for the C5 dorsal medial articular branch is the lateral concave  waist of the articular pillar of C5. Under fluoroscopic guidance, a Quincke needle was inserted until contact was made with os over the postero-lateral aspect of the articular pillar of C5 (target area). After negative aspiration for blood, 31m31mf the nerve block solution was injected without difficulty or complication. The needle was removed intact.   12 cc solution made of 10 cc of 0.2% ropivacaine, 2 cc of Decadron 10 mg/cc.  2 cc injected at each level above bilaterally.     Once the entire procedure was completed, the treated area was cleaned, making sure to leave some of the prepping solution back to take advantage of its long term bactericidal properties.  Anatomy Reference Guide:       Vitals:   10/25/21 0926 10/25/21 0931 10/25/21 0937 10/25/21 0943  BP: (!) 144/105 (!) 131/98 (!) 147/101 (!) 134/103  Pulse:      Resp: '18 18 18 18  '$ Temp:      TempSrc:      SpO2: 97% 96% 100% 98%  Weight:      Height:         Start Time: 0922 hrs. End Time: 0937 hrs.  Imaging Guidance (Spinal):          Type of Imaging Technique: Fluoroscopy Guidance (Spinal) Indication(s): Assistance in needle guidance and placement for procedures requiring needle placement in or near specific anatomical locations not easily accessible without such assistance. Exposure Time: Please see nurses notes. Contrast: None used. Fluoroscopic Guidance: I was personally present during the use of fluoroscopy. "Tunnel Vision Technique" used to obtain the best possible view of the target area. Parallax error corrected before commencing the procedure. "Direction-depth-direction" technique used to introduce the needle under continuous pulsed fluoroscopy. Once target was reached, antero-posterior, oblique, and lateral fluoroscopic projection used confirm needle placement in all planes. Images permanently stored in EMR. Interpretation: No contrast injected. I personally interpreted the imaging intraoperatively. Adequate  needle placement confirmed in multiple planes. Permanent images saved into  the patient's record.  Post-operative Assessment:  Post-procedure Vital Signs:  Pulse/HCG Rate: 7368 Temp:  (!) 97.4 F (36.3 C) Resp: 18 BP:  (!) 134/103 SpO2: 98 %  EBL: None  Complications: No immediate post-treatment complications observed by team, or reported by patient.  Note: The patient tolerated the entire procedure well. A repeat set of vitals were taken after the procedure and the patient was kept under observation following institutional policy, for this type of procedure. Post-procedural neurological assessment was performed, showing return to baseline, prior to discharge. The patient was provided with post-procedure discharge instructions, including a section on how to identify potential problems. Should any problems arise concerning this procedure, the patient was given instructions to immediately contact us, at any time, without hesitation. In any case, we plan to contact the patient by telephone for a follow-up status report regarding this interventional procedure.  Comments:  No additional relevant information.  Plan of Care  Orders:  Orders Placed This Encounter  Procedures   DG PAIN [facility] C-ARM 1-60 MIN NO REPORT    Intraoperative interpretation by procedural physician at [facility].    Standing Status:   Standing    Number of Occurrences:   1    Order Specific Question:   Reason for exam:    Answer:   Assistance in needle guidance and placement for procedures requiring needle placement in or near specific anatomical locations not easily accessible without such assistance.    Medications ordered for procedure: Meds ordered this encounter  Medications   lidocaine (XYLOCAINE) 2 % (with pres) injection 400 mg   lactated ringers infusion   midazolam (VERSED) 5 MG/5ML injection 0.5-2 mg    Make sure Flumazenil is available in the pyxis when using this medication. If  oversedation occurs, administer 0.2 mg IV over 15 sec. If after 45 sec no response, administer 0.2 mg again over 1 min; may repeat at 1 min intervals; not to exceed 4 doses (1 mg)   sodium chloride flush (NS) 0.9 % injection 1 mL   ropivacaine (PF) 2 mg/mL (0.2%) (NAROPIN) injection 1 mL   dexamethasone (DECADRON) injection 10 mg   Medications administered: We administered lidocaine, lactated ringers, midazolam, ropivacaine (PF) 2 mg/mL (0.2%), and dexamethasone.  See the medical record for exact dosing, route, and time of administration.  Follow-up plan:   Return in about 4 weeks (around 11/22/2021) for Post Procedure Evaluation, virtual.       T1/T2 ESI 07/31/21, B/L C3,4,5 MBNB #1 10/25/21     Recent Visits Date Type Provider Dept  09/06/21 Office Visit Gillis Santa, MD [facility]  07/31/21 Procedure visit Gillis Santa, MD [facility]  Showing recent visits within past 90 days and meeting all other requirements Today's Visits Date Type Provider Dept  10/25/21 Procedure visit Gillis Santa, MD [facility]  Showing today's visits and meeting all other requirements Future Appointments Date Type Provider Dept  11/22/21 Appointment Gillis Santa, MD [facility]  Showing future appointments within next 90 days and meeting all other requirements  Disposition: Discharge home  Discharge (Date  Time): 10/25/2021; 0943 hrs.   Primary Care Physician: Lesleigh Noe, MD Location: [facility] Note by: Gillis Santa, MD Date: 10/25/2021; Time: 10:11 AM  Disclaimer:  Medicine is not an exact science. The only guarantee in medicine is that nothing is guaranteed. It is important to note that the decision to proceed with this intervention was based on the information collected  from the patient. The Data and conclusions were drawn from the patient's questionnaire, the interview, and the physical examination. Because the  information was provided in large part by the patient, it cannot be guaranteed that it has not been purposely or unconsciously manipulated. Every effort has been made to obtain as much relevant data as possible for this evaluation. It is important to note that the conclusions that lead to this procedure are derived in large part from the available data. Always take into account that the treatment will also be dependent on availability of resources and existing treatment guidelines, considered by other Pain Management Practitioners as being common knowledge and practice, at the time of the intervention. For Medico-Legal purposes, it is also important to point out that variation in procedural techniques and pharmacological choices are the acceptable norm. The indications, contraindications, technique, and results of the above procedure should only be interpreted and judged by a Board-Certified Interventional Pain Specialist with extensive familiarity and expertise in the same exact procedure and technique.

## 2021-10-26 ENCOUNTER — Telehealth: Payer: Self-pay

## 2021-10-26 NOTE — Telephone Encounter (Signed)
Post procedure phone call. Patient states he is doing well.  

## 2021-11-22 ENCOUNTER — Ambulatory Visit
Payer: BLUE CROSS/BLUE SHIELD | Attending: Student in an Organized Health Care Education/Training Program | Admitting: Student in an Organized Health Care Education/Training Program

## 2021-11-22 ENCOUNTER — Encounter: Payer: Self-pay | Admitting: Student in an Organized Health Care Education/Training Program

## 2021-11-22 DIAGNOSIS — M47812 Spondylosis without myelopathy or radiculopathy, cervical region: Secondary | ICD-10-CM

## 2021-11-22 NOTE — Progress Notes (Signed)
I attempted to call the patient however no response. Voicemail left instructing patient to call front desk office at [phone] to reschedule appointment. -Dr Holley Raring    Post-procedure evaluation   Type: Cervical Facet Medial Branch Block(s) #1  Laterality: Bilateral  Level: C3, C4, C5, Medial Branch Level(s). Injecting these levels blocks the C3-4, C4-5,  cervical facet joints.  Imaging: Fluoroscopic guidance Anesthesia: Local anesthesia (1-2% Lidocaine) Sedation: Minimal Sedation              .  DOS: 10/25/2021  Performed by: Gillis Santa, MD  Purpose: Diagnostic/Therapeutic Indications: Cervicalgia (cervical spine axial pain) severe enough to impact quality of life or function. 1. Cervical facet joint syndrome   2. Cervical spondylosis   3. Cervicalgia   4. Chronic pain syndrome    NAS-11 Pain score:   Pre-procedure: 5 /10   Post-procedure: 0-No pain/10      Effectiveness:  Initial hour after procedure: 100 %  Subsequent 4-6 hours post-procedure: 100 %  Analgesia past initial 6 hours: 20 % (patient initially had pain relief, the next morning had some acute pain, eased off and pain relief was good for approx 2 days and then has been returning.  Headaches have improved.)  Ongoing improvement:  Analgesic:  <20% Function: Back to baseline ROM: Back to baseline

## 2021-12-07 DIAGNOSIS — H43811 Vitreous degeneration, right eye: Secondary | ICD-10-CM | POA: Diagnosis not present

## 2021-12-13 ENCOUNTER — Emergency Department: Payer: BLUE CROSS/BLUE SHIELD

## 2021-12-13 ENCOUNTER — Other Ambulatory Visit: Payer: Self-pay

## 2021-12-13 DIAGNOSIS — S01411A Laceration without foreign body of right cheek and temporomandibular area, initial encounter: Secondary | ICD-10-CM | POA: Diagnosis not present

## 2021-12-13 DIAGNOSIS — Z23 Encounter for immunization: Secondary | ICD-10-CM | POA: Insufficient documentation

## 2021-12-13 DIAGNOSIS — R519 Headache, unspecified: Secondary | ICD-10-CM | POA: Diagnosis not present

## 2021-12-13 DIAGNOSIS — W268XXA Contact with other sharp object(s), not elsewhere classified, initial encounter: Secondary | ICD-10-CM | POA: Insufficient documentation

## 2021-12-13 DIAGNOSIS — M542 Cervicalgia: Secondary | ICD-10-CM | POA: Diagnosis not present

## 2021-12-13 DIAGNOSIS — S0181XA Laceration without foreign body of other part of head, initial encounter: Secondary | ICD-10-CM | POA: Diagnosis not present

## 2021-12-13 DIAGNOSIS — S0993XA Unspecified injury of face, initial encounter: Secondary | ICD-10-CM | POA: Diagnosis not present
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
## 2021-12-14 ENCOUNTER — Emergency Department
Admission: EM | Admit: 2021-12-14 | Discharge: 2021-12-14 | Disposition: A | Discharge status: Home / Self Care | Payer: BLUE CROSS/BLUE SHIELD | Attending: Emergency Medicine | Admitting: Emergency Medicine

## 2021-12-14 DIAGNOSIS — S0083XA Contusion of other part of head, initial encounter: Secondary | ICD-10-CM

## 2021-12-14 DIAGNOSIS — S0990XA Unspecified injury of head, initial encounter: Secondary | ICD-10-CM

## 2021-12-14 DIAGNOSIS — S0181XA Laceration without foreign body of other part of head, initial encounter: Secondary | ICD-10-CM

## 2021-12-14 MED ORDER — ONDANSETRON 4 MG PO TBDP: 4.0000 mg | ORAL_TABLET | Freq: Four times a day (QID) | ORAL | 0 refills | Status: DC | PRN

## 2021-12-14 MED ORDER — IBUPROFEN 800 MG PO TABS
800.0000 mg | ORAL_TABLET | Freq: Once | ORAL | Status: AC
  Administered 2021-12-14: 800 mg via ORAL
  Filled 2021-12-14: qty 1

## 2021-12-14 MED ORDER — OXYCODONE-ACETAMINOPHEN 5-325 MG PO TABS: 1.0000 | ORAL_TABLET | ORAL | 0 refills | Status: DC | PRN

## 2021-12-14 MED ORDER — IBUPROFEN 800 MG PO TABS: 800.0000 mg | ORAL_TABLET | Freq: Three times a day (TID) | ORAL | 0 refills | Status: DC | PRN

## 2021-12-14 MED ORDER — TETANUS-DIPHTH-ACELL PERTUSSIS 5-2.5-18.5 LF-MCG/0.5 IM SUSY
0.5000 mL | PREFILLED_SYRINGE | Freq: Once | INTRAMUSCULAR | Status: AC
  Administered 2021-12-14: 0.5 mL via INTRAMUSCULAR
  Filled 2021-12-14: qty 0.5

## 2021-12-14 MED ORDER — OXYCODONE-ACETAMINOPHEN 5-325 MG PO TABS
2.0000 | ORAL_TABLET | Freq: Once | ORAL | Status: AC
  Administered 2021-12-14: 2 via ORAL
  Filled 2021-12-14: qty 2

## 2021-12-14 MED ORDER — ONDANSETRON 4 MG PO TBDP
4.0000 mg | ORAL_TABLET | Freq: Once | ORAL | Status: AC
  Administered 2021-12-14: 4 mg via ORAL
  Filled 2021-12-14: qty 1

## 2021-12-14 MED ORDER — LIDOCAINE-EPINEPHRINE (PF) 2 %-1:200000 IJ SOLN
20.0000 mL | Freq: Once | INTRAMUSCULAR | Status: AC
  Administered 2021-12-14: 20 mL via INTRADERMAL
  Filled 2021-12-14: qty 20

## 2021-12-14 MED ORDER — BACITRACIN ZINC 500 UNIT/GM EX OINT
TOPICAL_OINTMENT | Freq: Once | CUTANEOUS | Status: AC
  Filled 2021-12-14: qty 0.9
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
## 2021-12-14 NOTE — Discharge Instructions (Addendum)
You may clean your wound gently with warm soap and water daily and apply over-the-counter Neosporin.  Your sutures are absorbable and will come out on their own in the next 1 to 2 weeks.  Your CT scan showed no fractures, bleeding in the brain but did show a large hematoma which is a large collection of blood and bruising in the soft tissues of the cheek.  I recommend applying ice to your face multiple times a day to help with pain and swelling.   We recommend that after a severe head injury that you avoid alcohol, sedatives for the next week.  Please rest and drink plenty of water.  We recommend that you avoid any activity that may lead to another head injury for at least 1 week or until your symptoms have completely resolved.  We also recommend "brain rest" to ensure the best possible long term outcomes - please avoid TV, cell phones, tablets, computers as much as possible for the next 48 hours.      You are being provided a prescription for opiates (also known as narcotics) for pain control.  Opiates can be addictive and should only be used when absolutely necessary for pain control when other alternatives do not work.  We recommend you only use them for the recommended amount of time and only as prescribed.  Please do not take with other sedative medications or alcohol.  Please do not drive, operate machinery, make important decisions while taking opiates.  Please note that these medications can be addictive and have high abuse potential.  Patients can become addicted to narcotics after only taking them for a few days.  Please keep these medications locked away from children, teenagers or any family members with history of substance abuse.  Narcotic pain medicine may also make you constipated.  You may use over-the-counter medications such as MiraLAX, Colace to prevent constipation.  If you become constipated, you may use over-the-counter enemas as needed.  Itching and nausea are also common side  effects of narcotic pain medication.  If you develop uncontrolled vomiting or a rash, please stop these medications and seek medical care.

## 2022-02-27 ENCOUNTER — Ambulatory Visit: Payer: BC Managed Care – PPO | Admitting: Family

## 2022-02-27 ENCOUNTER — Encounter: Payer: Self-pay | Admitting: Family

## 2022-02-27 VITALS — BP 128/82 | HR 62 | Temp 97.6°F | Resp 16 | Ht 65.0 in | Wt 185.2 lb

## 2022-02-27 DIAGNOSIS — G894 Chronic pain syndrome: Secondary | ICD-10-CM | POA: Diagnosis not present

## 2022-02-27 DIAGNOSIS — E782 Mixed hyperlipidemia: Secondary | ICD-10-CM | POA: Diagnosis not present

## 2022-02-27 DIAGNOSIS — Z981 Arthrodesis status: Secondary | ICD-10-CM

## 2022-02-27 DIAGNOSIS — E7800 Pure hypercholesterolemia, unspecified: Secondary | ICD-10-CM

## 2022-02-27 DIAGNOSIS — Z79899 Other long term (current) drug therapy: Secondary | ICD-10-CM

## 2022-02-27 DIAGNOSIS — M4802 Spinal stenosis, cervical region: Secondary | ICD-10-CM | POA: Diagnosis not present

## 2022-02-27 DIAGNOSIS — G44209 Tension-type headache, unspecified, not intractable: Secondary | ICD-10-CM

## 2022-02-27 DIAGNOSIS — R7989 Other specified abnormal findings of blood chemistry: Secondary | ICD-10-CM

## 2022-02-27 DIAGNOSIS — L989 Disorder of the skin and subcutaneous tissue, unspecified: Secondary | ICD-10-CM

## 2022-02-27 MED ORDER — GABAPENTIN 300 MG PO CAPS: 300.0000 mg | ORAL_CAPSULE | Freq: Every day | ORAL | 1 refills | Status: DC

## 2022-02-27 NOTE — Assessment & Plan Note (Signed)
Lab ordered, pending results.

## 2022-02-27 NOTE — Patient Instructions (Signed)
A referral was placed today for dermatology.  Please let us know if you have not heard back within 2 weeks about the referral.  I have created an order for lab work today during our visit.  Please schedule an appointment on your way out to return to the lab at your convenience. Please return fasting at your lab appointment (meaning you can only drink black coffee and or water prior to your appointment). I will reach out to you in regards to the labs when I receive the results.   I am happy to have you as my new patient. I am excited to continue on this healthcare journey with you.   Please keep in mind Any my chart messages you send have up to a three business day turnaround for a response.  Phone calls may take up to a one full business day turnaround for a  response.   If you need a medication refill I recommend you request it through the pharmacy as this is easiest for [place] rather than sending a message and or phone call.   Due to recent changes in healthcare laws, you may see results of your imaging and/or laboratory studies on MyChart before I have had a chance to review them.  I understand that in some cases there may be results that are confusing or concerning to you. Please understand that not all results are received at the same time and often I may need to interpret multiple results in order to provide you with the best plan of care or course of treatment. Therefore, I ask that you please give me 2 business days to thoroughly review all your results before contacting my office for clarification. Should we see a critical lab result, you will be contacted sooner.   It was a pleasure seeing you today! Please do not hesitate to reach out with any questions and or concerns.  Regards,   Eugenia Pancoast FNP-C

## 2022-02-27 NOTE — Assessment & Plan Note (Signed)
Chronic. H/o surgery.  Seen neurosurgeon in the past, and has done quite well on gabapentin.

## 2022-02-27 NOTE — Progress Notes (Signed)
Established Patient Office Visit  Subjective:  Patient ID: Jeremiah Carpenter, male    DOB: 1966-02-08  Age: 57 y.o. MRN: [id number]  CC:  Chief Complaint  Patient presents with   Transitions Of Care    HPI Jeremiah Carpenter is here for a transition of care visit.  Prior provider was: Dr. Waunita Schooner, prior to that was Dr. Marjory Lies  Pt is without acute concerns.   chronic concerns:  Hyperlipidemia: atorvastatin 40 mg qhs. Working on low cholesterol diet. Lab Results  Component Value Date   CHOL 188 08/07/2021   HDL 44.00 08/07/2021   LDLCALC 134 (H) 04/21/2018   LDLDIRECT 110.0 08/07/2021   TRIG 348.0 (H) 08/07/2021   CHOLHDL 4 08/07/2021    Gabapentin: 300 mg twice daily, also does see neurosurgeon at times for injections which are also helpful. He did run out of gabapentin for some time, found leftover 100 mg and has been taking but would like to increase to 300 daily again was helping better. Does not contribute to fatigue.   Low testosterone: has seen urologist in the past, tried testosterone in the past and decided to stop as was feeling better. Does have fatigue at current however does have rigorous scheduled. No ED or issues with orgasm.   Past Medical History:  Diagnosis Date   History of colon polyps    benign   History of kidney stones    History of migraine    last one about a month ago   Hyperlipidemia    takes Atorvastatin daily   Joint pain    Weakness    numbness mainly left hand and rarely on right    Past Surgical History:  Procedure Laterality Date   ANTERIOR CERVICAL DECOMP/DISCECTOMY FUSION N/A 01/11/2016   Procedure: C5-6, C6-7 Anterior Cervical Discectomy and Fusion, Allograft, Plate;  Surgeon: Marybelle Killings, MD;  Location: [facility];  Service: Orthopedics;  Laterality: N/A;   CARPAL TUNNEL RELEASE Bilateral    COLONOSCOPY     COLONOSCOPY WITH PROPOFOL N/A 01/25/2020   Procedure: COLONOSCOPY WITH PROPOFOL;  Surgeon: Jonathon Bellows, MD;  Location:  [facility];  Service: Gastroenterology;  Laterality: N/A;   VASECTOMY      Family History  Problem Relation Age of Onset   Lung cancer Mother        smoker history   Hypertension Father    Hyperlipidemia Father    Heart attack Father 30       bypass surgery   Cancer Half-Brother 63       testicular cancer?   Alcoholism Half-Brother    Other Half-Sister        lifestyle, unknown   Colon cancer Neg Hx     Social History   Socioeconomic History   Marital status: Married    Spouse name: [name]   Number of children: 3   Years of education: some college   Highest education level: Not on file  Occupational History   Not on file  Tobacco Use   Smoking status: Former   Smokeless tobacco: Never   Tobacco comments:    quit smoking 15+ yrs ago  Vaping Use   Vaping Use: Never used  Substance and Sexual Activity   Alcohol use: Yes    Alcohol/week: 0.0 standard drinks of alcohol    Comment: 2-3 times a week   Drug use: Not Currently    Comment: CBD - pain   Sexual activity: Yes    Birth control/protection: Post-menopausal, Surgical  Other Topics Concern   Not on file  Social History Narrative   12/03/19   From: [place], moved in 1994 to [place]   Living: with wife, [name] (1996)   Work: Adult nurse      Family: 3 adults daughters - [place], [place], Carrolyn Meiers       Enjoys: traveling, kayaking, fishing      Exercise: walking a lot - up and down ladders   Diet: pretty good      Safety   Seat belts: Yes    Guns: Yes  and secure   Safe in relationships: Yes    Social Determinants of Radio broadcast assistant Strain: Not on file  Food Insecurity: Not on file  Transportation Needs: Not on file  Physical Activity: Not on file  Stress: Not on file  Social Connections: Not on file  Intimate Partner Violence: Not on file    Outpatient Medications Prior to Visit  Medication Sig Dispense Refill   atorvastatin (LIPITOR) 40 MG tablet Take 1 tablet (40 mg total) by  mouth daily. 90 tablet 3   gabapentin (NEURONTIN) 300 MG capsule Take 1 capsule (300 mg total) by mouth 2 (two) times daily. 60 capsule 2   ibuprofen (ADVIL) 800 MG tablet Take 1 tablet (800 mg total) by mouth every 8 (eight) hours as needed. (Patient not taking: Reported on 02/27/2022) 30 tablet 0   ondansetron (ZOFRAN-ODT) 4 MG disintegrating tablet Take 1 tablet (4 mg total) by mouth every 6 (six) hours as needed for nausea or vomiting. (Patient not taking: Reported on 02/27/2022) 20 tablet 0   oxyCODONE-acetaminophen (PERCOCET/ROXICET) 5-325 MG tablet Take 1 tablet by mouth every 4 (four) hours as needed. (Patient not taking: Reported on 02/27/2022) 15 tablet 0   No facility-administered medications prior to visit.    Allergies  Allergen Reactions   Amoxicillin Hives and Rash    Has patient had a PCN reaction causing immediate rash, facial/tongue/throat swelling, SOB or lightheadedness with hypotension: no Has patient had a PCN reaction causing severe rash involving mucus membranes or skin necrosis:no Has patient had a PCN reaction that required hospitalization no Has patient had a PCN reaction occurring within the last 10 years: yes If all of the above answers are "NO", then may proceed with Cephalosporin use.    ROS Review of Systems  Review of Systems  Respiratory:  Negative for shortness of breath.   Cardiovascular:  Negative for chest pain and palpitations.  Gastrointestinal:  Negative for constipation and diarrhea.  Genitourinary:  Negative for dysuria, frequency and urgency.  Musculoskeletal:  Negative for myalgias.  Psychiatric/Behavioral:  Negative for depression and suicidal ideas.   All other systems reviewed and are negative.    Objective:    Physical Exam  Gen: NAD, resting comfortably CV: RRR with no murmurs appreciated Pulm: NWOB, CTAB with no crackles, wheezes, or rhonchi Skin: warm, dry Psych: Normal affect and thought content  BP 128/82   Pulse 62   Temp  97.6 F (36.4 C)   Resp 16   Ht '5\' 5"'$  (1.651 m)   Wt 185 lb 4 oz (84 kg)   SpO2 98%   BMI 30.83 kg/m  Wt Readings from Last 3 Encounters:  02/27/22 185 lb 4 oz (84 kg)  12/13/21 173 lb (78.5 kg)  10/25/21 180 lb (81.6 kg)     Health Maintenance Due  Topic Date Due   Zoster Vaccines- Shingrix (1 of 2) Never done   COVID-19 Vaccine (3 -  Pfizer risk series) 06/30/2019   INFLUENZA VACCINE  09/26/2021    There are no preventive care reminders to display for this patient.  Lab Results  Component Value Date   TSH 1.44 09/03/2011   Lab Results  Component Value Date   WBC 5.7 10/22/2018   HGB 15.3 10/22/2018   HCT 45.1 10/22/2018   MCV 91.3 10/22/2018   PLT 276.0 10/22/2018   Lab Results  Component Value Date   NA 140 08/07/2021   K 4.3 08/07/2021   CO2 30 08/07/2021   GLUCOSE 91 08/07/2021   BUN 22 08/07/2021   CREATININE 1.03 08/07/2021   BILITOT 0.6 08/07/2021   ALKPHOS 52 08/07/2021   AST 16 08/07/2021   ALT 21 08/07/2021   PROT 6.7 08/07/2021   ALBUMIN 4.3 08/07/2021   CALCIUM 9.5 08/07/2021   ANIONGAP 8 01/04/2016   GFR 80.50 08/07/2021   Lab Results  Component Value Date   CHOL 188 08/07/2021   Lab Results  Component Value Date   HDL 44.00 08/07/2021   Lab Results  Component Value Date   LDLCALC 134 (H) 04/21/2018   Lab Results  Component Value Date   TRIG 348.0 (H) 08/07/2021   Lab Results  Component Value Date   CHOLHDL 4 08/07/2021   Lab Results  Component Value Date   HGBA1C 5.4 09/03/2011      Assessment & Plan:   Problem List Items Addressed This Visit       Other   HYPERCHOLESTEROLEMIA    Work on low cholesterol diet, exercise as tolerated. Take medication as prescribed.       Low testosterone    Lab ordered, pending results.      Cervical stenosis of spine - Primary    Chronic. H/o surgery.  Seen neurosurgeon in the past, and has done quite well on gabapentin.       Relevant Medications   gabapentin  (NEURONTIN) 300 MG capsule   S/P cervical spinal fusion   Relevant Medications   gabapentin (NEURONTIN) 300 MG capsule   Tension headache    Stable at current.  Seems to coincide with neck pain when it occurs.       Relevant Medications   gabapentin (NEURONTIN) 300 MG capsule   Chronic pain syndrome    Currently on gabapentin  300 mg once daily.  Stable on this doing well.       Relevant Medications   gabapentin (NEURONTIN) 300 MG capsule   Other Visit Diagnoses     Mixed hyperlipidemia       Relevant Orders   Lipid panel   On statin therapy       Relevant Orders   Comprehensive metabolic panel   Low testosterone in male       Relevant Orders   Testosterone   Skin lesion of face       Relevant Orders   Ambulatory referral to Dermatology       Meds ordered this encounter  Medications   gabapentin (NEURONTIN) 300 MG capsule    Sig: Take 1 capsule (300 mg total) by mouth daily.    Dispense:  90 capsule    Refill:  1    Follow-up: Return in about 6 months (around 08/28/2022) for f/u cholesterol.    Eugenia Pancoast, FNP

## 2022-02-27 NOTE — Assessment & Plan Note (Signed)
Currently on gabapentin  300 mg once daily.  Stable on this doing well.

## 2022-02-27 NOTE — Assessment & Plan Note (Signed)
Work on low cholesterol diet, exercise as tolerated. Take medication as prescribed.

## 2022-02-27 NOTE — Assessment & Plan Note (Signed)
Stable at current.  Seems to coincide with neck pain when it occurs.

## 2022-02-28 ENCOUNTER — Encounter: Payer: Self-pay | Admitting: Family

## 2022-02-28 DIAGNOSIS — Z981 Arthrodesis status: Secondary | ICD-10-CM

## 2022-02-28 DIAGNOSIS — M4802 Spinal stenosis, cervical region: Secondary | ICD-10-CM

## 2022-02-28 DIAGNOSIS — G894 Chronic pain syndrome: Secondary | ICD-10-CM

## 2022-03-01 MED ORDER — GABAPENTIN 300 MG PO CAPS: 300.0000 mg | ORAL_CAPSULE | Freq: Every day | ORAL | 0 refills | Status: DC

## 2022-03-07 ENCOUNTER — Other Ambulatory Visit (INDEPENDENT_AMBULATORY_CARE_PROVIDER_SITE_OTHER): Payer: BC Managed Care – PPO

## 2022-03-07 DIAGNOSIS — Z79899 Other long term (current) drug therapy: Secondary | ICD-10-CM

## 2022-03-07 DIAGNOSIS — R7989 Other specified abnormal findings of blood chemistry: Secondary | ICD-10-CM | POA: Diagnosis not present

## 2022-03-07 DIAGNOSIS — E782 Mixed hyperlipidemia: Secondary | ICD-10-CM | POA: Diagnosis not present

## 2022-03-07 LAB — LIPID PANEL
Cholesterol: 167 mg/dL (ref 0–200)
HDL: 45.9 mg/dL (ref >39.00)
LDL Cholesterol: 94 mg/dL (ref 0–99)
NonHDL: 121.01
Total CHOL/HDL Ratio: 4
Triglycerides: 134 mg/dL (ref 0.0–149.0)
VLDL: 26.8 mg/dL (ref 0.0–40.0)

## 2022-03-07 LAB — COMPREHENSIVE METABOLIC PANEL
ALT: 26 U/L (ref 0–53)
AST: 19 U/L (ref 0–37)
Albumin: 4.4 g/dL (ref 3.5–5.2)
Alkaline Phosphatase: 54 U/L (ref 39–117)
BUN: 19 mg/dL (ref 6–23)
CO2: 30 mEq/L (ref 19–32)
Calcium: 9.2 mg/dL (ref 8.4–10.5)
Chloride: 105 mEq/L (ref 96–112)
Creatinine, Ser: 0.92 mg/dL (ref 0.40–1.50)
GFR: 91.81 mL/min (ref >60.00)
Glucose, Bld: 96 mg/dL (ref 70–99)
Potassium: 4.5 mEq/L (ref 3.5–5.1)
Sodium: 143 mEq/L (ref 135–145)
Total Bilirubin: 0.6 mg/dL (ref 0.2–1.2)
Total Protein: 7 g/dL (ref 6.0–8.3)

## 2022-03-07 LAB — TESTOSTERONE: Testosterone: 293.66 ng/dL — ABNORMAL LOW (ref 300.00–890.00)

## 2022-03-07 NOTE — Progress Notes (Signed)
Tesosterone is on the lower end of normal, is pt wanting to see urology to see if supplement necessary?   Cholesterol looks great, LDL at 94. Continue to work on low cholesterol diet.

## 2022-03-14 ENCOUNTER — Other Ambulatory Visit: Payer: Self-pay | Admitting: Family

## 2022-03-14 DIAGNOSIS — R7989 Other specified abnormal findings of blood chemistry: Secondary | ICD-10-CM

## 2022-03-26 DIAGNOSIS — U071 COVID-19: Secondary | ICD-10-CM | POA: Diagnosis not present

## 2022-03-26 DIAGNOSIS — R030 Elevated blood-pressure reading, without diagnosis of hypertension: Secondary | ICD-10-CM | POA: Diagnosis not present

## 2022-03-26 DIAGNOSIS — R0981 Nasal congestion: Secondary | ICD-10-CM | POA: Diagnosis not present

## 2022-03-26 DIAGNOSIS — Z6829 Body mass index (BMI) 29.0-29.9, adult: Secondary | ICD-10-CM | POA: Diagnosis not present

## 2022-03-26 DIAGNOSIS — R509 Fever, unspecified: Secondary | ICD-10-CM | POA: Diagnosis not present

## 2022-03-26 DIAGNOSIS — R5383 Other fatigue: Secondary | ICD-10-CM | POA: Diagnosis not present

## 2022-03-26 DIAGNOSIS — Z789 Other specified health status: Secondary | ICD-10-CM | POA: Diagnosis not present

## 2022-04-02 ENCOUNTER — Encounter: Payer: Self-pay | Admitting: Family

## 2022-04-02 ENCOUNTER — Other Ambulatory Visit: Payer: Self-pay | Admitting: Family

## 2022-04-02 ENCOUNTER — Other Ambulatory Visit (HOSPITAL_COMMUNITY): Payer: Self-pay

## 2022-04-02 DIAGNOSIS — G894 Chronic pain syndrome: Secondary | ICD-10-CM

## 2022-04-02 DIAGNOSIS — M4802 Spinal stenosis, cervical region: Secondary | ICD-10-CM

## 2022-04-02 DIAGNOSIS — Z981 Arthrodesis status: Secondary | ICD-10-CM

## 2022-04-02 MED ORDER — GABAPENTIN 300 MG PO CAPS
300.0000 mg | ORAL_CAPSULE | Freq: Every day | ORAL | 1 refills | Status: DC
  Filled 2022-04-02: qty 30, 30d supply, fill #0

## 2022-04-03 ENCOUNTER — Other Ambulatory Visit: Payer: Self-pay

## 2022-04-03 MED ORDER — GABAPENTIN 300 MG PO CAPS: 300.0000 mg | ORAL_CAPSULE | Freq: Every day | ORAL | 1 refills | Status: DC

## 2022-04-03 NOTE — Telephone Encounter (Signed)
Pharmacy has been updated to send in rx to [facility].

## 2022-05-24 ENCOUNTER — Ambulatory Visit: Payer: Self-pay | Admitting: Urology

## 2022-06-20 ENCOUNTER — Encounter: Payer: Self-pay | Admitting: Family

## 2022-06-20 DIAGNOSIS — E7800 Pure hypercholesterolemia, unspecified: Secondary | ICD-10-CM

## 2022-06-20 DIAGNOSIS — Z981 Arthrodesis status: Secondary | ICD-10-CM

## 2022-06-20 DIAGNOSIS — G894 Chronic pain syndrome: Secondary | ICD-10-CM

## 2022-06-20 DIAGNOSIS — M4802 Spinal stenosis, cervical region: Secondary | ICD-10-CM

## 2022-06-20 MED ORDER — GABAPENTIN 300 MG PO CAPS: 300.0000 mg | ORAL_CAPSULE | Freq: Every day | ORAL | 1 refills | Status: DC

## 2022-06-20 MED ORDER — ATORVASTATIN CALCIUM 40 MG PO TABS: 40.0000 mg | ORAL_TABLET | Freq: Every day | ORAL | 3 refills | Status: DC

## 2022-06-20 NOTE — Telephone Encounter (Signed)
gabapentin (NEURONTIN) 300 MG capsule and atorvastatin (LIPITOR) 40 MG tablet refill request- send to [facility]  [facility]- 02/27/22 [facility]- 08/28/22 [facility]- 08/09/21 ( Lipitor 90 tabs/ 3 refills) ( Gabapentin 04/03/22- 90 caps/ 1 refill)

## 2022-08-09 ENCOUNTER — Encounter: Payer: BLUE CROSS/BLUE SHIELD | Admitting: Family Medicine

## 2022-08-28 ENCOUNTER — Ambulatory Visit: Payer: BC Managed Care – PPO | Admitting: Family

## 2022-08-28 ENCOUNTER — Encounter: Payer: Self-pay | Admitting: Family

## 2022-08-28 VITALS — BP 108/80 | HR 65 | Temp 97.8°F | Ht 65.0 in | Wt 183.0 lb

## 2022-08-28 DIAGNOSIS — Z872 Personal history of diseases of the skin and subcutaneous tissue: Secondary | ICD-10-CM | POA: Insufficient documentation

## 2022-08-28 DIAGNOSIS — E7800 Pure hypercholesterolemia, unspecified: Secondary | ICD-10-CM

## 2022-08-28 DIAGNOSIS — Z0000 Encounter for general adult medical examination without abnormal findings: Secondary | ICD-10-CM | POA: Diagnosis not present

## 2022-08-28 DIAGNOSIS — M47812 Spondylosis without myelopathy or radiculopathy, cervical region: Secondary | ICD-10-CM | POA: Diagnosis not present

## 2022-08-28 DIAGNOSIS — G44209 Tension-type headache, unspecified, not intractable: Secondary | ICD-10-CM

## 2022-08-28 DIAGNOSIS — G894 Chronic pain syndrome: Secondary | ICD-10-CM

## 2022-08-28 DIAGNOSIS — M5412 Radiculopathy, cervical region: Secondary | ICD-10-CM

## 2022-08-28 DIAGNOSIS — M4312 Spondylolisthesis, cervical region: Secondary | ICD-10-CM | POA: Diagnosis not present

## 2022-08-28 DIAGNOSIS — Z79899 Other long term (current) drug therapy: Secondary | ICD-10-CM

## 2022-08-28 LAB — COMPREHENSIVE METABOLIC PANEL
ALT: 22 U/L (ref 0–53)
AST: 20 U/L (ref 0–37)
Albumin: 4.5 g/dL (ref 3.5–5.2)
Alkaline Phosphatase: 54 U/L (ref 39–117)
BUN: 18 mg/dL (ref 6–23)
CO2: 28 mEq/L (ref 19–32)
Calcium: 9.9 mg/dL (ref 8.4–10.5)
Chloride: 104 mEq/L (ref 96–112)
Creatinine, Ser: 0.93 mg/dL (ref 0.40–1.50)
GFR: 90.32 mL/min (ref >60.00)
Glucose, Bld: 99 mg/dL (ref 70–99)
Potassium: 5.3 mEq/L — ABNORMAL HIGH (ref 3.5–5.1)
Sodium: 139 mEq/L (ref 135–145)
Total Bilirubin: 0.7 mg/dL (ref 0.2–1.2)
Total Protein: 7.4 g/dL (ref 6.0–8.3)

## 2022-08-28 LAB — LIPID PANEL
Cholesterol: 187 mg/dL (ref 0–200)
HDL: 38.1 mg/dL — ABNORMAL LOW (ref >39.00)
LDL Cholesterol: 114 mg/dL — ABNORMAL HIGH (ref 0–99)
NonHDL: 148.71
Total CHOL/HDL Ratio: 5
Triglycerides: 172 mg/dL — ABNORMAL HIGH (ref 0.0–149.0)
VLDL: 34.4 mg/dL (ref 0.0–40.0)

## 2022-08-28 MED ORDER — BUTALBITAL-APAP-CAFFEINE 50-325-40 MG PO TABS: 1.0000 | ORAL_TABLET | Freq: Four times a day (QID) | ORAL | 0 refills | Status: DC | PRN

## 2022-08-28 NOTE — Progress Notes (Signed)
Subjective:  Patient ID: Jeremiah Carpenter, male    DOB: Jun 30, 1965  Age: 57 y.o. MRN: [id number]  Patient Care Team: Mort Sawyers, FNP as PCP - General (Family Medicine)   CC:  Chief Complaint  Patient presents with   Annual Exam    H/O elevated cholesterol.    HPI Jeremiah Carpenter is a 57 y.o. male who presents today for an annual physical exam. He reports consuming a general diet.  On his feet a lot at work  He generally feels well. He reports sleeping well. He does not have additional problems to discuss today.   Vision:Within last year Dental:Receives regular dental care STD:The patient denies history of sexually transmitted disease.  Colonoscopy: 01/25/20 Shingles vaccine: declines  Pt is without acute concerns.   HLD: atorvastatin 40 mg nightly, tolerating well.   Chronic pain syndrome: on gabapentin. Still has headaches in neck as needed.  Has h/o of having dry needling and would like to restart if able.   Tension headaches: will take excedrin migraine and 800- ibuprofen with some relief of symptoms. Pt does not want muscle relaxer. Feels exhausted the next day after a tension headache. As them a few times a month.   Left sided detached retina, was seeing bright flashes of light. Was seen by ophthalmologist. Was told to f/u if worse.    Advanced Directives Patient does have advanced directives including living will. He does not have a copy in the electronic medical record.      08/28/2022    8:00 AM 10/25/2021    8:45 AM 08/07/2021    5:02 PM  PHQ9 SCORE ONLY  PHQ-9 Total Score 0 0 0      DEPRESSION SCREENING    08/28/2022    8:00 AM 10/25/2021    8:45 AM 08/07/2021    5:02 PM 12/03/2019    9:29 AM 04/21/2018   11:20 AM 06/03/2017    8:06 AM  PHQ 2/9 Scores  PHQ - 2 Score 0 0 0 0 0 0     ROS: Negative unless specifically indicated above in HPI.    Current Outpatient Medications:    atorvastatin (LIPITOR) 40 MG tablet, Take 1 tablet (40 mg total) by  mouth daily., Disp: 90 tablet, Rfl: 3   butalbital-acetaminophen-caffeine (FIORICET) 50-325-40 MG tablet, Take 1 tablet by mouth every 6 (six) hours as needed for headache., Disp: 14 tablet, Rfl: 0   gabapentin (NEURONTIN) 300 MG capsule, Take 1 capsule (300 mg total) by mouth daily., Disp: 90 capsule, Rfl: 1    Objective:    BP 108/80 (BP Location: Left Arm, Patient Position: Sitting, Cuff Size: Normal)   Pulse 65   Temp 97.8 F (36.6 C)   Ht 5\' 5"  (1.651 m)   Wt 183 lb (83 kg)   SpO2 96%   BMI 30.45 kg/m   BP Readings from Last 3 Encounters:  08/28/22 108/80  02/27/22 128/82  12/14/21 133/80      Physical Exam Vitals reviewed.  Constitutional:      General: He is not in acute distress.    Appearance: Normal appearance. He is obese. He is not ill-appearing, toxic-appearing or diaphoretic.  HENT:     Head: Normocephalic.     Right Ear: Tympanic membrane normal.     Left Ear: Tympanic membrane normal.     Nose: Nose normal.     Mouth/Throat:     Mouth: Mucous membranes are moist.  Eyes:     Pupils: Pupils  are equal, round, and reactive to light.  Cardiovascular:     Rate and Rhythm: Normal rate and regular rhythm.  Pulmonary:     Effort: Pulmonary effort is normal.     Breath sounds: Normal breath sounds.  Abdominal:     General: Abdomen is flat.     Tenderness: There is no abdominal tenderness.  Musculoskeletal:        General: Normal range of motion.  Skin:    General: Skin is warm.  Neurological:     General: No focal deficit present.     Mental Status: He is alert and oriented to person, place, and time. Mental status is at baseline.  Psychiatric:        Mood and Affect: Mood normal.        Behavior: Behavior normal.        Thought Content: Thought content normal.        Judgment: Judgment normal.          Assessment & Plan:  History of seborrheic keratosis  Anterolisthesis of cervical spine (C3-C4) -     Ambulatory referral to Physical  Therapy  Cervical spondylosis -     Ambulatory referral to Physical Therapy  Cervical radicular pain -     Ambulatory referral to Physical Therapy  Encounter for general adult medical examination without abnormal findings Assessment & Plan: Patient Counseling(The following topics were reviewed):  Preventative care handout given to pt  Health maintenance and immunizations reviewed. Please refer to Health maintenance section. Pt advised on safe sex, wearing seatbelts in car, and proper nutrition labwork ordered today for annual Dental health: Discussed importance of regular tooth brushing, flossing, and dental visits.   Orders: -     Lipid panel -     Comprehensive metabolic panel  HYPERCHOLESTEROLEMIA -     Lipid panel  On statin therapy -     Comprehensive metabolic panel  Chronic pain syndrome Assessment & Plan: Referral placed for dry needling to physical therapy as pt has had success with this before.  Continue gabapentin 300 mg once daily.    Tension headache Assessment & Plan: Pdmp reviewed  Trial fioricet  Pt declines muscle relaxer at this time.   Orders: -     Butalbital-APAP-Caffeine; Take 1 tablet by mouth every 6 (six) hours as needed for headache.  Dispense: 14 tablet; Refill: 0      Follow-up: Return in about 1 year (around 08/28/2023) for f/u CPE.   Mort Sawyers, FNP

## 2022-08-28 NOTE — Assessment & Plan Note (Signed)
Referral placed for dry needling to physical therapy as pt has had success with this before.  Continue gabapentin 300 mg once daily.

## 2022-08-28 NOTE — Assessment & Plan Note (Signed)
Pdmp reviewed  Trial fioricet  Pt declines muscle relaxer at this time.

## 2022-08-28 NOTE — Assessment & Plan Note (Signed)
Patient Counseling(The following topics were reviewed): ? Preventative care handout given to pt  ?Health maintenance and immunizations reviewed. Please refer to Health maintenance section. ?Pt advised on safe sex, wearing seatbelts in car, and proper nutrition ?labwork ordered today for annual ?Dental health: Discussed importance of regular tooth brushing, flossing, and dental visits. ? ? ?

## 2022-08-29 ENCOUNTER — Other Ambulatory Visit: Payer: Self-pay | Admitting: Family

## 2022-08-29 DIAGNOSIS — E7800 Pure hypercholesterolemia, unspecified: Secondary | ICD-10-CM

## 2022-08-29 MED ORDER — ATORVASTATIN CALCIUM 80 MG PO TABS: 80.0000 mg | ORAL_TABLET | Freq: Every day | ORAL | 3 refills | Status: DC

## 2022-09-01 ENCOUNTER — Encounter: Payer: Self-pay | Admitting: Family

## 2022-09-01 DIAGNOSIS — E875 Hyperkalemia: Secondary | ICD-10-CM

## 2022-09-01 DIAGNOSIS — E7800 Pure hypercholesterolemia, unspecified: Secondary | ICD-10-CM

## 2022-09-03 ENCOUNTER — Telehealth: Payer: Self-pay

## 2022-09-03 NOTE — Telephone Encounter (Signed)
Left detailed voicemail per [facility] notes) for patient to call the office back.

## 2022-09-03 NOTE — Telephone Encounter (Signed)
Received refill request from [facility] for Atorvastatin.   One year was sent to local pharmacy on 08/29/22. Need to call patient to confirm the [facility] being [facility] to [facility].   Melina Copa, CMA

## 2022-09-11 NOTE — Therapy (Signed)
OUTPATIENT PHYSICAL THERAPY CERVICAL EVALUATION   Patient Name: Jeremiah Carpenter MRN: [id number] DOB:1965/04/13, 57 y.o., male Today's Date: 09/12/2022  END OF SESSION:  PT End of Session - 09/12/22 1019     Visit Number 1    Number of Visits 16    Date for PT Re-Evaluation 11/07/22    PT Start Time 1016    PT Stop Time 1059    PT Time Calculation (min) 43 min    Activity Tolerance Patient tolerated treatment well    Behavior During Therapy WFL for tasks assessed/performed             Past Medical History:  Diagnosis Date   History of colon polyps    benign   History of kidney stones    History of migraine    last one about a month ago   Hyperlipidemia    takes Atorvastatin daily   Joint pain    Weakness    numbness mainly left hand and rarely on right   Past Surgical History:  Procedure Laterality Date   ANTERIOR CERVICAL DECOMP/DISCECTOMY FUSION N/A 01/11/2016   Procedure: C5-6, C6-7 Anterior Cervical Discectomy and Fusion, Allograft, Plate;  Surgeon: Eldred Manges, MD;  Location: [facility] OR;  Service: Orthopedics;  Laterality: N/A;   CARPAL TUNNEL RELEASE Bilateral    COLONOSCOPY     COLONOSCOPY WITH PROPOFOL N/A 01/25/2020   Procedure: COLONOSCOPY WITH PROPOFOL;  Surgeon: Wyline Mood, MD;  Location: [facility] ENDOSCOPY;  Service: Gastroenterology;  Laterality: N/A;   VASECTOMY     Patient Active Problem List   Diagnosis Date Noted   History of seborrheic keratosis 08/28/2022   Cervical spondylosis 09/06/2021   Anterolisthesis of cervical spine (C3-C4) 03/30/2021   Cervicalgia 03/07/2021   Chronic pain syndrome 03/07/2021   Tension headache 07/11/2020   S/P cervical spinal fusion 11/13/2017   Cervical stenosis of spine 01/11/2016   Hx of adenomatous polyp of colon 05/19/2014   Low testosterone 05/06/2012   HYPERCHOLESTEROLEMIA 06/21/2009    PCP: Mort Sawyers, FNP  REFERRING PROVIDER: Mort Sawyers, FNP  REFERRING DIAG:   M43.12 (ICD-10-CM) -  Anterolisthesis of cervical spine M47.812 (ICD-10-CM) - Cervical spondylosis M54.12 (ICD-10-CM) - Cervical radicular pain    THERAPY DIAG:  Cervicalgia  Abnormal posture  Pain in thoracic spine  Rationale for Evaluation and Treatment: Rehabilitation  ONSET DATE: ongoing > 6 months   SUBJECTIVE:  SUBJECTIVE STATEMENT: Pt is an Art gallery manager for [employer], 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 A couple days a week walk 3-5 miles. Pt reports the neck surgery has caused his muscles to overcompensate, pt reports his sleep is mostly interupted with infrequent distrurbances after higher work work.    Hand dominance: Right  PERTINENT HISTORY:  Pt is a 57 y.o. M with a PMH of HLD (medicated), joint pain, tension headaches, cervicalgia, and numbness of L hand. Pt has a surgical history of ACDF at two levels, C5-6, C6-7 in 2017, as well as B carpal tunnel release surgery. Pt has been a pt at [facility] pain clinic and PT clinic for chronic pain, and is requesting [name], DPT for dry needling.   PAIN:  Are you having pain? Yes: NPRS scale: 3/10 Pain location: head Pain description: dull, but progresses to severe migraine Aggravating factors: prolonged sitting, increased mobility  Relieving factors: lying down, standing up  Headache in the back of the head that is always there, and infrequently pain down into arm.  HA: worst 10/10, currently 3/10 (typical), best: 3/10  PRECAUTIONS: None  RED FLAGS: None     WEIGHT BEARING RESTRICTIONS: No  FALLS:  Has patient fallen in last 6 months? No  LIVING ENVIRONMENT: Lives with: lives with their family Lives in: House/apartment Stairs: Yes: Internal: 14  steps; can reach both and External: 3 steps; on right going up Has following equipment at home: None  OCCUPATION: Art gallery manager for [employer]  PLOF: Independent  PATIENT GOALS: "I want to get HA down to a 1/10, and never have to have surgery again"  NEXT MD VISIT: n/a  OBJECTIVE:   PATIENT SURVEYS:  NDI 11/50 FOTO TBD  COGNITION: Overall cognitive status: Within functional limits for tasks assessed  SENSATION: WFL  POSTURE: rounded shoulders, forward head, increased lumbar lordosis, increased thoracic kyphosis, and flexed trunk   PALPATION: B SCM tension and pain R > L B cervical paraspinal tension and pain R > L  B thoracic paraspinal tension and pain B intercostal tension and pain    CERVICAL ROM:   Active ROM A/PROM (deg) eval  Flexion 35  Extension 30  Right lateral flexion 35  Left lateral flexion 30  Right rotation 35  Left rotation 25   (Blank rows = not tested)  UPPER EXTREMITY ROM:  Not formally assessed with goniometer, but appeared [facility] for all GHJ mobility.    UPPER EXTREMITY MMT:  MMT Right eval Left eval  Shoulder flexion 5 5  Shoulder extension 5 5  Shoulder abduction 5 5  Shoulder adduction 5 5  Shoulder internal rotation    Shoulder external rotation    Middle trapezius    Lower trapezius 4- 4-  Elbow flexion    Elbow extension     (Blank rows = not tested)    ACCESSORY MOTIONS:  B Rib mobility: hypomobile and painful  C2-T2 PA's: hypomobile with no springing, muscle guarding  Scapular mobility: hypomobile, minimal scapular rotation with > 90 deg shoulder abduction, scapulae adherence to ribcage    FUNCTIONAL TESTS:  N/A  TODAY'S TREATMENT:  DATE: 09/12/22   Eval only    PATIENT EDUCATION:  Education details: POC, purpose of PT, HEP, goals  Person educated: Patient Education method: Explanation,  Demonstration, and Tactile cues Education comprehension: verbalized understanding, returned demonstration, tactile cues required, and needs further education  HOME EXERCISE PROGRAM: Formal HEP to be prescribed;  -bed mobility re: supine <> sidelying with bridge and elbows into bed with no cervical flexion / sidelying <> EOB with no cervical flexion   ASSESSMENT:  CLINICAL IMPRESSION:  Patient is a very pleasant 57 y.o. M who was seen today for physical therapy evaluation and treatment for cervicalgia. Patient has been a patient in this clinic in the past for R shoulder pain and radicular symptoms, which patient reports are no longer an issue. Patient has significant forward head posture and inability to extend thoracic spine in sitting and standing, and experienced pain upon palpation to ribcage, thoracic paraspinals, upper traps, cervical paraspinals, and B SCM, R > L. Bed mobility was addressed today, instructing patient to eliminate rapid cervical flexion with sit up motion by utilizing bridge and elbow extension into bed during supine <>sidelying <> EOB. Plans to address pain and resting muscle tension next session as introduction to postural re-education. Patient will benefit from skilled PT to address mobility, posture, flexibility, and strengthening to improve chronic headaches, QoL, and improve PLOF.   OBJECTIVE IMPAIRMENTS: Abnormal gait, decreased activity tolerance, decreased endurance, decreased mobility, decreased ROM, decreased strength, hypomobility, increased fascial restrictions, increased muscle spasms, impaired flexibility, impaired UE functional use, improper body mechanics, postural dysfunction, and pain.   ACTIVITY LIMITATIONS: carrying, lifting, bending, sitting, standing, squatting, sleeping, bed mobility, reach over head, and locomotion level  PARTICIPATION LIMITATIONS: community activity, occupation, and yard work  PERSONAL FACTORS: Behavior pattern, Fitness, Past/current  experiences, Profession, Time since onset of injury/illness/exacerbation, and 1-2 comorbidities: HLD and cervicalgia  are also affecting patient's functional outcome.   REHAB POTENTIAL: Good  CLINICAL DECISION MAKING: Stable/uncomplicated  EVALUATION COMPLEXITY: Low   GOALS: Goals reviewed with patient? Yes  SHORT TERM GOALS: Target date: 10/10/2022  Patient will be independent in home exercise program to improve   strength/mobility for better functional independence with ADLs. Baseline: To be given. Goal status: INITIAL  2. Patient will demonstrate sidelying <> EOB bed mobility without cervical flexion to demonstrate decrease cervical strain and improved functional mobility.  Baseline: rapid cervical flexion and sit up motion with holding of breath to sit up Goal status: INITIAL   LONG TERM GOALS: Target date: 11/07/2022   Patient will increase FOTO score to equal to or greater than ___ to demonstrate statistically significant improvement in mobility and quality of life.  Baseline: TBD Goal status: INITIAL  2.   Patient will report a worst pain of 3/10 on NPRS in headaches to improve tolerance with ADLs and reduced symptoms with activities.   Baseline: worst: 10/10 Goal status: INITIAL  3.  Patient will report less < 2 / 5 on section 5 on NDI to indicate significant decrease in occurrence of headaches.  Baseline: 5/5 Goal status: INITIAL  4.  Patient will demonstrate upright posture in sitting and standing to decrease cervical musculature and upper trapezius resting tension.  Baseline: significant forward head posture, lumbar lordosis, unable to achieve thoracic extension to neutral  Goal status: INITIAL  5.  Patient will achieve full scapular range of motion for increased functional mobility to reach overhead with decrease compensatory muscle activation.  Baseline: no upward rotation past 90 degrees of shoulder abduction  Goal status: INITIAL     PLAN:  PT  FREQUENCY: 1-2x/week  PT DURATION: 8 weeks  PLANNED INTERVENTIONS: Therapeutic exercises, Therapeutic activity, Neuromuscular re-education, Balance training, Gait training, Patient/Family education, Self Care, Joint mobilization, Joint manipulation, Vestibular training, Canalith repositioning, Orthotic/Fit training, Dry Needling, Electrical stimulation, Spinal manipulation, Spinal mobilization, Cryotherapy, Moist heat, scar mobilization, Splintting, Taping, Traction, Biofeedback, Manual therapy, and Re-evaluation  PLAN FOR NEXT SESSION: FOTO For cervical (not lumbar), manual therapy to B posterior / lateral ribcage and thoracic paraspinals, dry needling, postural re-ed & strengthening of thoracic extensors and cervical flexors     [place] Jaeson Molstad, Student-PT 09/12/2022, 1:42 PM   This entire session was performed under direct supervision and direction of a licensed therapist/therapist assistant . I have personally read, edited and approve of the note as written.    This licensed clinician was present and actively directing care throughout the session at all times.  Nolon Bussing, PT, DPT Physical Therapist - [facility]  09/12/22, 2:23 PM

## 2022-09-12 ENCOUNTER — Ambulatory Visit: Payer: BC Managed Care – PPO | Attending: Family

## 2022-09-12 DIAGNOSIS — M25511 Pain in right shoulder: Secondary | ICD-10-CM | POA: Insufficient documentation

## 2022-09-12 DIAGNOSIS — R293 Abnormal posture: Secondary | ICD-10-CM

## 2022-09-12 DIAGNOSIS — M47812 Spondylosis without myelopathy or radiculopathy, cervical region: Secondary | ICD-10-CM | POA: Diagnosis not present

## 2022-09-12 DIAGNOSIS — M542 Cervicalgia: Secondary | ICD-10-CM

## 2022-09-12 DIAGNOSIS — G8929 Other chronic pain: Secondary | ICD-10-CM | POA: Diagnosis not present

## 2022-09-12 DIAGNOSIS — M5412 Radiculopathy, cervical region: Secondary | ICD-10-CM | POA: Diagnosis not present

## 2022-09-12 DIAGNOSIS — M546 Pain in thoracic spine: Secondary | ICD-10-CM | POA: Diagnosis not present

## 2022-09-12 DIAGNOSIS — M4312 Spondylolisthesis, cervical region: Secondary | ICD-10-CM | POA: Insufficient documentation

## 2022-09-13 NOTE — Therapy (Signed)
OUTPATIENT PHYSICAL THERAPY CERVICAL TREATMENT   Patient Name: Jeremiah Carpenter MRN: [id number] DOB:Jun 30, 1965, 57 y.o., male Today's Date: 09/17/2022  END OF SESSION:  PT End of Session - 09/17/22 0801     Visit Number 2    Number of Visits 16    Date for PT Re-Evaluation 11/07/22    PT Start Time 0800    PT Stop Time 0844    PT Time Calculation (min) 44 min    Activity Tolerance Patient tolerated treatment well    Behavior During Therapy [facility] for tasks assessed/performed              Past Medical History:  Diagnosis Date   History of colon polyps    benign   History of kidney stones    History of migraine    last one about a month ago   Hyperlipidemia    takes Atorvastatin daily   Joint pain    Weakness    numbness mainly left hand and rarely on right   Past Surgical History:  Procedure Laterality Date   ANTERIOR CERVICAL DECOMP/DISCECTOMY FUSION N/A 01/11/2016   Procedure: C5-6, C6-7 Anterior Cervical Discectomy and Fusion, Allograft, Plate;  Surgeon: Eldred Manges, MD;  Location: [facility] OR;  Service: Orthopedics;  Laterality: N/A;   CARPAL TUNNEL RELEASE Bilateral    COLONOSCOPY     COLONOSCOPY WITH PROPOFOL N/A 01/25/2020   Procedure: COLONOSCOPY WITH PROPOFOL;  Surgeon: Wyline Mood, MD;  Location: [facility] ENDOSCOPY;  Service: Gastroenterology;  Laterality: N/A;   VASECTOMY     Patient Active Problem List   Diagnosis Date Noted   History of seborrheic keratosis 08/28/2022   Cervical spondylosis 09/06/2021   Anterolisthesis of cervical spine (C3-C4) 03/30/2021   Cervicalgia 03/07/2021   Chronic pain syndrome 03/07/2021   Tension headache 07/11/2020   S/P cervical spinal fusion 11/13/2017   Cervical stenosis of spine 01/11/2016   Hx of adenomatous polyp of colon 05/19/2014   Low testosterone 05/06/2012   HYPERCHOLESTEROLEMIA 06/21/2009    PCP: Mort Sawyers, FNP  REFERRING PROVIDER: Mort Sawyers, FNP  REFERRING DIAG:   M43.12 (ICD-10-CM) -  Anterolisthesis of cervical spine M47.812 (ICD-10-CM) - Cervical spondylosis M54.12 (ICD-10-CM) - Cervical radicular pain    THERAPY DIAG:  Cervicalgia  Abnormal posture  Pain in thoracic spine  Chronic right shoulder pain  Rationale for Evaluation and Treatment: Rehabilitation  ONSET DATE: ongoing > 6 months   SUBJECTIVE:  SUBJECTIVE STATEMENT: Patient reports he is having bilateral hand tingling and had a migraine yesterday.    Hand dominance: Right  PERTINENT HISTORY:  Pt is a 57 y.o. M with a PMH of HLD (medicated), joint pain, tension headaches, cervicalgia, and numbness of L hand. Pt has a surgical history of ACDF at two levels, C5-6, C6-7 in 2017, as well as B carpal tunnel release surgery. Pt has been a pt at [facility] pain clinic and PT clinic for chronic pain, and is requesting [name], DPT for dry needling.   PAIN:  Are you having pain? Yes: NPRS scale: 3/10 Pain location: head Pain description: dull, but progresses to severe migraine Aggravating factors: prolonged sitting, increased mobility  Relieving factors: lying down, standing up  Headache in the back of the head that is always there, and infrequently pain down into arm.  HA: worst 10/10, currently 3/10 (typical), best: 3/10  PRECAUTIONS: None  RED FLAGS: None     WEIGHT BEARING RESTRICTIONS: No  FALLS:  Has patient fallen in last 6 months? No  LIVING ENVIRONMENT: Lives with: lives with their family Lives in: House/apartment Stairs: Yes: Internal: 14 steps; can reach both and External: 3 steps; on right going up Has following equipment at home: None  OCCUPATION: Art gallery manager for [employer]  PLOF: Independent  PATIENT GOALS: "I want to get HA down to a 1/10, and never have to have surgery again"  NEXT  MD VISIT: n/a  OBJECTIVE:   PATIENT SURVEYS:  NDI 11/50 FOTO TBD  COGNITION: Overall cognitive status: Within functional limits for tasks assessed  SENSATION: WFL  POSTURE: rounded shoulders, forward head, increased lumbar lordosis, increased thoracic kyphosis, and flexed trunk   PALPATION: B SCM tension and pain R > L B cervical paraspinal tension and pain R > L  B thoracic paraspinal tension and pain B intercostal tension and pain    CERVICAL ROM:   Active ROM A/PROM (deg) eval  Flexion 35  Extension 30  Right lateral flexion 35  Left lateral flexion 30  Right rotation 35  Left rotation 25   (Blank rows = not tested)  UPPER EXTREMITY ROM:  Not formally assessed with goniometer, but appeared [facility] for all GHJ mobility.    UPPER EXTREMITY MMT:  MMT Right eval Left eval  Shoulder flexion 5 5  Shoulder extension 5 5  Shoulder abduction 5 5  Shoulder adduction 5 5  Shoulder internal rotation    Shoulder external rotation    Middle trapezius    Lower trapezius 4- 4-  Elbow flexion    Elbow extension     (Blank rows = not tested)    ACCESSORY MOTIONS:  B Rib mobility: hypomobile and painful  C2-T2 PA's: hypomobile with no springing, muscle guarding  Scapular mobility: hypomobile, minimal scapular rotation with > 90 deg shoulder abduction, scapulae adherence to ribcage    FUNCTIONAL TESTS:  N/A  TODAY'S TREATMENT:  DATE: 09/17/22   Manual: Grade II mobilizations cervical to mid thoracic spine UPA and CPA.  Suboccipital release 4x30 seconds Cervical side bend with overpressure 2x30 seconds each side J mobilizations 3x30 seconds  TherEx: Scapular retractions 10x Chin tucks 10x  Cervical extension with towel SnAG 10x Upper trap stretch 30 seconds each LE Postural education for home and workplace set up  Trigger Point Dry  Needling (TDN), unbilled Education performed with patient regarding potential benefit of TDN. Reviewed precautions and risks with patient. Reviewed special precautions/risks over lung fields which include pneumothorax. Reviewed signs and symptoms of pneumothorax and advised pt to go to ER immediately if these symptoms develop advise them of dry needling treatment. Extensive time spent with pt to ensure full understanding of TDN risks. Pt provided verbal consent to treatment. TDN performed to  with 0.25 x 40 single needle placements with local twitch response (LTR). Pistoning technique utilized. Improved pain-free motion following intervention. Bilateral upper trap in supine position L forearm, L temporal region x 5 minutes    PATIENT EDUCATION:  Education details: POC, purpose of PT, HEP, goals  Person educated: Patient Education method: Explanation, Demonstration, and Tactile cues Education comprehension: verbalized understanding, returned demonstration, tactile cues required, and needs further education  HOME EXERCISE PROGRAM: Formal HEP to be prescribed;  -bed mobility re: supine <> sidelying with bridge and elbows into bed with no cervical flexion / sidelying <> EOB with no cervical flexion   Access Code: [email or web address] URL: [email or web address] Date: 09/17/2022 Prepared by: [name]  Exercises - Seated Scapular Retraction  - 1 x daily - 7 x weekly - 2 sets - 10 reps - 5 hold - Supine Chin Tuck  - 1 x daily - 7 x weekly - 2 sets - 10 reps - 5 hold - Seated Upper Trapezius Stretch  - 1 x daily - 7 x weekly - 2 sets - 2 reps - 30 hold  ASSESSMENT:  CLINICAL IMPRESSION:  Patient educated on posture at work and when in car, will bring in picture of work station for focused training and positioning. HEP introduced with patient tolerating strengthening and stretching interventions well. TDN tolerated well with patient having significant reduction of shoulder elevation by end  of session.  Patient will benefit from skilled PT to address mobility, posture, flexibility, and strengthening to improve chronic headaches, QoL, and improve PLOF.   OBJECTIVE IMPAIRMENTS: Abnormal gait, decreased activity tolerance, decreased endurance, decreased mobility, decreased ROM, decreased strength, hypomobility, increased fascial restrictions, increased muscle spasms, impaired flexibility, impaired UE functional use, improper body mechanics, postural dysfunction, and pain.   ACTIVITY LIMITATIONS: carrying, lifting, bending, sitting, standing, squatting, sleeping, bed mobility, reach over head, and locomotion level  PARTICIPATION LIMITATIONS: community activity, occupation, and yard work  PERSONAL FACTORS: Behavior pattern, Fitness, Past/current experiences, Profession, Time since onset of injury/illness/exacerbation, and 1-2 comorbidities: HLD and cervicalgia  are also affecting patient's functional outcome.   REHAB POTENTIAL: Good  CLINICAL DECISION MAKING: Stable/uncomplicated  EVALUATION COMPLEXITY: Low   GOALS: Goals reviewed with patient? Yes  SHORT TERM GOALS: Target date: 10/10/2022  Patient will be independent in home exercise program to improve   strength/mobility for better functional independence with ADLs. Baseline: To be given. Goal status: INITIAL  2. Patient will demonstrate sidelying <> EOB bed mobility without cervical flexion to demonstrate decrease cervical strain and improved functional mobility.  Baseline: rapid cervical flexion and sit up motion with holding of breath to sit up Goal status: INITIAL   LONG TERM GOALS:  Target date: 11/07/2022   Patient will increase FOTO score to equal to or greater than ___ to demonstrate statistically significant improvement in mobility and quality of life.  Baseline: TBD Goal status: INITIAL  2.   Patient will report a worst pain of 3/10 on NPRS in headaches to improve tolerance with ADLs and reduced symptoms with  activities.   Baseline: worst: 10/10 Goal status: INITIAL  3.  Patient will report less < 2 / 5 on section 5 on NDI to indicate significant decrease in occurrence of headaches.  Baseline: 5/5 Goal status: INITIAL  4.  Patient will demonstrate upright posture in sitting and standing to decrease cervical musculature and upper trapezius resting tension.  Baseline: significant forward head posture, lumbar lordosis, unable to achieve thoracic extension to neutral  Goal status: INITIAL  5.  Patient will achieve full scapular range of motion for increased functional mobility to reach overhead with decrease compensatory muscle activation.  Baseline: no upward rotation past 90 degrees of shoulder abduction  Goal status: INITIAL     PLAN:  PT FREQUENCY: 1-2x/week  PT DURATION: 8 weeks  PLANNED INTERVENTIONS: Therapeutic exercises, Therapeutic activity, Neuromuscular re-education, Balance training, Gait training, Patient/Family education, Self Care, Joint mobilization, Joint manipulation, Vestibular training, Canalith repositioning, Orthotic/Fit training, Dry Needling, Electrical stimulation, Spinal manipulation, Spinal mobilization, Cryotherapy, Moist heat, scar mobilization, Splintting, Taping, Traction, Biofeedback, Manual therapy, and Re-evaluation  PLAN FOR NEXT SESSION: FOTO For cervical (not lumbar), manual therapy to B posterior / lateral ribcage and thoracic paraspinals, dry needling, postural re-ed & strengthening of thoracic extensors and cervical flexors     [name], PT 09/17/2022, 9:02 AM

## 2022-09-17 ENCOUNTER — Ambulatory Visit: Payer: BC Managed Care – PPO

## 2022-09-17 DIAGNOSIS — M25511 Pain in right shoulder: Secondary | ICD-10-CM | POA: Diagnosis not present

## 2022-09-17 DIAGNOSIS — R293 Abnormal posture: Secondary | ICD-10-CM

## 2022-09-17 DIAGNOSIS — M546 Pain in thoracic spine: Secondary | ICD-10-CM

## 2022-09-17 DIAGNOSIS — G8929 Other chronic pain: Secondary | ICD-10-CM | POA: Diagnosis not present

## 2022-09-17 DIAGNOSIS — M47812 Spondylosis without myelopathy or radiculopathy, cervical region: Secondary | ICD-10-CM | POA: Diagnosis not present

## 2022-09-17 DIAGNOSIS — M542 Cervicalgia: Secondary | ICD-10-CM

## 2022-09-17 DIAGNOSIS — M4312 Spondylolisthesis, cervical region: Secondary | ICD-10-CM | POA: Diagnosis not present

## 2022-09-17 DIAGNOSIS — M5412 Radiculopathy, cervical region: Secondary | ICD-10-CM | POA: Diagnosis not present

## 2022-09-18 NOTE — Therapy (Signed)
OUTPATIENT PHYSICAL THERAPY CERVICAL TREATMENT   Patient Name: Jeremiah Carpenter MRN: [id number] DOB:03/26/1965, 57 y.o., male Today's Date: 09/19/2022  END OF SESSION:  PT End of Session - 09/19/22 1015     Visit Number 3    Number of Visits 16    Date for PT Re-Evaluation 11/07/22    PT Start Time 1015    PT Stop Time 1059    PT Time Calculation (min) 44 min    Activity Tolerance Patient tolerated treatment well    Behavior During Therapy WFL for tasks assessed/performed               Past Medical History:  Diagnosis Date   History of colon polyps    benign   History of kidney stones    History of migraine    last one about a month ago   Hyperlipidemia    takes Atorvastatin daily   Joint pain    Weakness    numbness mainly left hand and rarely on right   Past Surgical History:  Procedure Laterality Date   ANTERIOR CERVICAL DECOMP/DISCECTOMY FUSION N/A 01/11/2016   Procedure: C5-6, C6-7 Anterior Cervical Discectomy and Fusion, Allograft, Plate;  Surgeon: Eldred Manges, MD;  Location: [facility] OR;  Service: Orthopedics;  Laterality: N/A;   CARPAL TUNNEL RELEASE Bilateral    COLONOSCOPY     COLONOSCOPY WITH PROPOFOL N/A 01/25/2020   Procedure: COLONOSCOPY WITH PROPOFOL;  Surgeon: Wyline Mood, MD;  Location: [facility] ENDOSCOPY;  Service: Gastroenterology;  Laterality: N/A;   VASECTOMY     Patient Active Problem List   Diagnosis Date Noted   History of seborrheic keratosis 08/28/2022   Cervical spondylosis 09/06/2021   Anterolisthesis of cervical spine (C3-C4) 03/30/2021   Cervicalgia 03/07/2021   Chronic pain syndrome 03/07/2021   Tension headache 07/11/2020   S/P cervical spinal fusion 11/13/2017   Cervical stenosis of spine 01/11/2016   Hx of adenomatous polyp of colon 05/19/2014   Low testosterone 05/06/2012   HYPERCHOLESTEROLEMIA 06/21/2009    PCP: Mort Sawyers, FNP  REFERRING PROVIDER: Mort Sawyers, FNP  REFERRING DIAG:   M43.12 (ICD-10-CM) -  Anterolisthesis of cervical spine M47.812 (ICD-10-CM) - Cervical spondylosis M54.12 (ICD-10-CM) - Cervical radicular pain    THERAPY DIAG:  Cervicalgia  Abnormal posture  Pain in thoracic spine  Chronic right shoulder pain  Rationale for Evaluation and Treatment: Rehabilitation  ONSET DATE: ongoing > 6 months   SUBJECTIVE:  SUBJECTIVE STATEMENT: Patient reports soreness after last session, feels it is improving.    Hand dominance: Right  PERTINENT HISTORY:  Pt is a 57 y.o. M with a PMH of HLD (medicated), joint pain, tension headaches, cervicalgia, and numbness of L hand. Pt has a surgical history of ACDF at two levels, C5-6, C6-7 in 2017, as well as B carpal tunnel release surgery. Pt has been a pt at [facility] pain clinic and PT clinic for chronic pain, and is requesting [name], DPT for dry needling.   PAIN:  Are you having pain? Yes: NPRS scale: 3/10 Pain location: head Pain description: dull, but progresses to severe migraine Aggravating factors: prolonged sitting, increased mobility  Relieving factors: lying down, standing up  Headache in the back of the head that is always there, and infrequently pain down into arm.  HA: worst 10/10, currently 3/10 (typical), best: 3/10  PRECAUTIONS: None  RED FLAGS: None     WEIGHT BEARING RESTRICTIONS: No  FALLS:  Has patient fallen in last 6 months? No  LIVING ENVIRONMENT: Lives with: lives with their family Lives in: House/apartment Stairs: Yes: Internal: 14 steps; can reach both and External: 3 steps; on right going up Has following equipment at home: None  OCCUPATION: Art gallery manager for [employer]  PLOF: Independent  PATIENT GOALS: "I want to get HA down to a 1/10, and never have to have surgery again"  NEXT MD VISIT:  n/a  OBJECTIVE:   PATIENT SURVEYS:  NDI 11/50 FOTO TBD  COGNITION: Overall cognitive status: Within functional limits for tasks assessed  SENSATION: WFL  POSTURE: rounded shoulders, forward head, increased lumbar lordosis, increased thoracic kyphosis, and flexed trunk   PALPATION: B SCM tension and pain R > L B cervical paraspinal tension and pain R > L  B thoracic paraspinal tension and pain B intercostal tension and pain    CERVICAL ROM:   Active ROM A/PROM (deg) eval  Flexion 35  Extension 30  Right lateral flexion 35  Left lateral flexion 30  Right rotation 35  Left rotation 25   (Blank rows = not tested)  UPPER EXTREMITY ROM:  Not formally assessed with goniometer, but appeared [facility] for all GHJ mobility.    UPPER EXTREMITY MMT:  MMT Right eval Left eval  Shoulder flexion 5 5  Shoulder extension 5 5  Shoulder abduction 5 5  Shoulder adduction 5 5  Shoulder internal rotation    Shoulder external rotation    Middle trapezius    Lower trapezius 4- 4-  Elbow flexion    Elbow extension     (Blank rows = not tested)    ACCESSORY MOTIONS:  B Rib mobility: hypomobile and painful  C2-T2 PA's: hypomobile with no springing, muscle guarding  Scapular mobility: hypomobile, minimal scapular rotation with > 90 deg shoulder abduction, scapulae adherence to ribcage    FUNCTIONAL TESTS:  N/A  TODAY'S TREATMENT:  DATE: 09/19/22   Manual: Grade II mobilizations cervical to mid thoracic spine UPA and CPA.  Suboccipital release 4x30 seconds Cervical side bend with overpressure 2x30 seconds each side J mobilizations 3x30 seconds  TherEx: GTP Y overhread 15x GTB ER 15x  GTB row 15x  Scapular retractions 10x Chin tucks 10x  Cervical extension with towel SnAG 10x Upper trap stretch 30 seconds each LE   Trigger Point Dry Needling  (TDN), unbilled Education performed with patient regarding potential benefit of TDN. Reviewed precautions and risks with patient. Reviewed special precautions/risks over lung fields which include pneumothorax. Reviewed signs and symptoms of pneumothorax and advised pt to go to ER immediately if these symptoms develop advise them of dry needling treatment. Extensive time spent with pt to ensure full understanding of TDN risks. Pt provided verbal consent to treatment. TDN performed to  with 0.25 x 40 single needle placements with local twitch response (LTR). Pistoning technique utilized. Improved pain-free motion following intervention. Bilateral upper trap in prone position n x 5 minutes    PATIENT EDUCATION:  Education details: POC, purpose of PT, HEP, goals  Person educated: Patient Education method: Explanation, Demonstration, and Tactile cues Education comprehension: verbalized understanding, returned demonstration, tactile cues required, and needs further education  HOME EXERCISE PROGRAM: Formal HEP to be prescribed;  -bed mobility re: supine <> sidelying with bridge and elbows into bed with no cervical flexion / sidelying <> EOB with no cervical flexion   Access Code: [email or web address] URL: [email or web address] Date: 09/17/2022 Prepared by: [name]  Exercises - Seated Scapular Retraction  - 1 x daily - 7 x weekly - 2 sets - 10 reps - 5 hold - Supine Chin Tuck  - 1 x daily - 7 x weekly - 2 sets - 10 reps - 5 hold - Seated Upper Trapezius Stretch  - 1 x daily - 7 x weekly - 2 sets - 2 reps - 30 hold  ASSESSMENT:  CLINICAL IMPRESSION: Patient tolerated TDN in prone position today well with multiple large trigger points released. Posterior chain strengthening tolerated well with no pain increase. Focus on scapular stabilization in a retracted and depressed position performed.  Patient will benefit from skilled PT to address mobility, posture, flexibility, and strengthening to  improve chronic headaches, QoL, and improve PLOF.   OBJECTIVE IMPAIRMENTS: Abnormal gait, decreased activity tolerance, decreased endurance, decreased mobility, decreased ROM, decreased strength, hypomobility, increased fascial restrictions, increased muscle spasms, impaired flexibility, impaired UE functional use, improper body mechanics, postural dysfunction, and pain.   ACTIVITY LIMITATIONS: carrying, lifting, bending, sitting, standing, squatting, sleeping, bed mobility, reach over head, and locomotion level  PARTICIPATION LIMITATIONS: community activity, occupation, and yard work  PERSONAL FACTORS: Behavior pattern, Fitness, Past/current experiences, Profession, Time since onset of injury/illness/exacerbation, and 1-2 comorbidities: HLD and cervicalgia  are also affecting patient's functional outcome.   REHAB POTENTIAL: Good  CLINICAL DECISION MAKING: Stable/uncomplicated  EVALUATION COMPLEXITY: Low   GOALS: Goals reviewed with patient? Yes  SHORT TERM GOALS: Target date: 10/10/2022  Patient will be independent in home exercise program to improve   strength/mobility for better functional independence with ADLs. Baseline: To be given. Goal status: INITIAL  2. Patient will demonstrate sidelying <> EOB bed mobility without cervical flexion to demonstrate decrease cervical strain and improved functional mobility.  Baseline: rapid cervical flexion and sit up motion with holding of breath to sit up Goal status: INITIAL   LONG TERM GOALS: Target date: 11/07/2022   Patient will increase FOTO score to equal to  or greater than ___ to demonstrate statistically significant improvement in mobility and quality of life.  Baseline: TBD Goal status: INITIAL  2.   Patient will report a worst pain of 3/10 on NPRS in headaches to improve tolerance with ADLs and reduced symptoms with activities.   Baseline: worst: 10/10 Goal status: INITIAL  3.  Patient will report less < 2 / 5 on section 5  on NDI to indicate significant decrease in occurrence of headaches.  Baseline: 5/5 Goal status: INITIAL  4.  Patient will demonstrate upright posture in sitting and standing to decrease cervical musculature and upper trapezius resting tension.  Baseline: significant forward head posture, lumbar lordosis, unable to achieve thoracic extension to neutral  Goal status: INITIAL  5.  Patient will achieve full scapular range of motion for increased functional mobility to reach overhead with decrease compensatory muscle activation.  Baseline: no upward rotation past 90 degrees of shoulder abduction  Goal status: INITIAL     PLAN:  PT FREQUENCY: 1-2x/week  PT DURATION: 8 weeks  PLANNED INTERVENTIONS: Therapeutic exercises, Therapeutic activity, Neuromuscular re-education, Balance training, Gait training, Patient/Family education, Self Care, Joint mobilization, Joint manipulation, Vestibular training, Canalith repositioning, Orthotic/Fit training, Dry Needling, Electrical stimulation, Spinal manipulation, Spinal mobilization, Cryotherapy, Moist heat, scar mobilization, Splintting, Taping, Traction, Biofeedback, Manual therapy, and Re-evaluation  PLAN FOR NEXT SESSION: FOTO For cervical (not lumbar), manual therapy to B posterior / lateral ribcage and thoracic paraspinals, dry needling, postural re-ed & strengthening of thoracic extensors and cervical flexors     [name], PT 09/19/2022, 12:47 PM

## 2022-09-19 ENCOUNTER — Ambulatory Visit: Payer: BC Managed Care – PPO

## 2022-09-19 DIAGNOSIS — M5412 Radiculopathy, cervical region: Secondary | ICD-10-CM | POA: Diagnosis not present

## 2022-09-19 DIAGNOSIS — M4312 Spondylolisthesis, cervical region: Secondary | ICD-10-CM | POA: Diagnosis not present

## 2022-09-19 DIAGNOSIS — M25511 Pain in right shoulder: Secondary | ICD-10-CM | POA: Diagnosis not present

## 2022-09-19 DIAGNOSIS — R293 Abnormal posture: Secondary | ICD-10-CM

## 2022-09-19 DIAGNOSIS — M542 Cervicalgia: Secondary | ICD-10-CM | POA: Diagnosis not present

## 2022-09-19 DIAGNOSIS — G8929 Other chronic pain: Secondary | ICD-10-CM

## 2022-09-19 DIAGNOSIS — M546 Pain in thoracic spine: Secondary | ICD-10-CM

## 2022-09-19 DIAGNOSIS — M47812 Spondylosis without myelopathy or radiculopathy, cervical region: Secondary | ICD-10-CM | POA: Diagnosis not present

## 2022-09-19 NOTE — Telephone Encounter (Signed)
Since this is new, pt would need to be seen here to evaluate where to go . To make sure no sinus issues and or is it bone. we will determine and then refer to appropriate provider if needed.

## 2022-09-24 NOTE — Therapy (Signed)
OUTPATIENT PHYSICAL THERAPY CERVICAL TREATMENT   Patient Name: Jeremiah Carpenter MRN: [id number] DOB:1965/12/24, 57 y.o., male Today's Date: 09/25/2022  END OF SESSION:  PT End of Session - 09/25/22 0714     Visit Number 4    Number of Visits 16    Date for PT Re-Evaluation 11/07/22    PT Start Time 0715    PT Stop Time 0759    PT Time Calculation (min) 44 min    Activity Tolerance Patient tolerated treatment well    Behavior During Therapy [facility] for tasks assessed/performed                Past Medical History:  Diagnosis Date   History of colon polyps    benign   History of kidney stones    History of migraine    last one about a month ago   Hyperlipidemia    takes Atorvastatin daily   Joint pain    Weakness    numbness mainly left hand and rarely on right   Past Surgical History:  Procedure Laterality Date   ANTERIOR CERVICAL DECOMP/DISCECTOMY FUSION N/A 01/11/2016   Procedure: C5-6, C6-7 Anterior Cervical Discectomy and Fusion, Allograft, Plate;  Surgeon: Eldred Manges, MD;  Location: [facility] OR;  Service: Orthopedics;  Laterality: N/A;   CARPAL TUNNEL RELEASE Bilateral    COLONOSCOPY     COLONOSCOPY WITH PROPOFOL N/A 01/25/2020   Procedure: COLONOSCOPY WITH PROPOFOL;  Surgeon: Wyline Mood, MD;  Location: [facility] ENDOSCOPY;  Service: Gastroenterology;  Laterality: N/A;   VASECTOMY     Patient Active Problem List   Diagnosis Date Noted   History of seborrheic keratosis 08/28/2022   Cervical spondylosis 09/06/2021   Anterolisthesis of cervical spine (C3-C4) 03/30/2021   Cervicalgia 03/07/2021   Chronic pain syndrome 03/07/2021   Tension headache 07/11/2020   S/P cervical spinal fusion 11/13/2017   Cervical stenosis of spine 01/11/2016   Hx of adenomatous polyp of colon 05/19/2014   Low testosterone 05/06/2012   HYPERCHOLESTEROLEMIA 06/21/2009    PCP: Mort Sawyers, FNP  REFERRING PROVIDER: Mort Sawyers, FNP  REFERRING DIAG:   M43.12 (ICD-10-CM) -  Anterolisthesis of cervical spine M47.812 (ICD-10-CM) - Cervical spondylosis M54.12 (ICD-10-CM) - Cervical radicular pain    THERAPY DIAG:  Cervicalgia  Abnormal posture  Pain in thoracic spine  Rationale for Evaluation and Treatment: Rehabilitation  ONSET DATE: ongoing > 6 months   SUBJECTIVE:  SUBJECTIVE STATEMENT: Patient brings in picture of his work place set up. Has had intermittent compliance with HEP.     Hand dominance: Right  PERTINENT HISTORY:  Pt is a 57 y.o. M with a PMH of HLD (medicated), joint pain, tension headaches, cervicalgia, and numbness of L hand. Pt has a surgical history of ACDF at two levels, C5-6, C6-7 in 2017, as well as B carpal tunnel release surgery. Pt has been a pt at [facility] pain clinic and PT clinic for chronic pain, and is requesting [name], DPT for dry needling.   PAIN:  Are you having pain? Yes: NPRS scale: 3/10 Pain location: head Pain description: dull, but progresses to severe migraine Aggravating factors: prolonged sitting, increased mobility  Relieving factors: lying down, standing up  Headache in the back of the head that is always there, and infrequently pain down into arm.  HA: worst 10/10, currently 3/10 (typical), best: 3/10  PRECAUTIONS: None  RED FLAGS: None     WEIGHT BEARING RESTRICTIONS: No  FALLS:  Has patient fallen in last 6 months? No  LIVING ENVIRONMENT: Lives with: lives with their family Lives in: House/apartment Stairs: Yes: Internal: 14 steps; can reach both and External: 3 steps; on right going up Has following equipment at home: None  OCCUPATION: Art gallery manager for [employer]  PLOF: Independent  PATIENT GOALS: "I want to get HA down to a 1/10, and never have to have surgery again"  NEXT MD VISIT:  n/a  OBJECTIVE:   PATIENT SURVEYS:  NDI 11/50 FOTO TBD  COGNITION: Overall cognitive status: Within functional limits for tasks assessed  SENSATION: WFL  POSTURE: rounded shoulders, forward head, increased lumbar lordosis, increased thoracic kyphosis, and flexed trunk   PALPATION: B SCM tension and pain R > L B cervical paraspinal tension and pain R > L  B thoracic paraspinal tension and pain B intercostal tension and pain    CERVICAL ROM:   Active ROM A/PROM (deg) eval  Flexion 35  Extension 30  Right lateral flexion 35  Left lateral flexion 30  Right rotation 35  Left rotation 25   (Blank rows = not tested)  UPPER EXTREMITY ROM:  Not formally assessed with goniometer, but appeared [facility] for all GHJ mobility.    UPPER EXTREMITY MMT:  MMT Right eval Left eval  Shoulder flexion 5 5  Shoulder extension 5 5  Shoulder abduction 5 5  Shoulder adduction 5 5  Shoulder internal rotation    Shoulder external rotation    Middle trapezius    Lower trapezius 4- 4-  Elbow flexion    Elbow extension     (Blank rows = not tested)    ACCESSORY MOTIONS:  B Rib mobility: hypomobile and painful  C2-T2 PA's: hypomobile with no springing, muscle guarding  Scapular mobility: hypomobile, minimal scapular rotation with > 90 deg shoulder abduction, scapulae adherence to ribcage    FUNCTIONAL TESTS:  N/A  TODAY'S TREATMENT:  DATE: 09/25/22   Manual: Grade II mobilizations cervical to mid thoracic spine UPA and CPA.  Suboccipital release 4x30 seconds Cervical side bend with overpressure 2x30 seconds each side J mobilizations 3x30 seconds  TherEx: GTP Y overhead 15x GTB ER 15x  GTB row 15x  Scapular retractions/circles 10x Chin tucks 10x 5 second holds: cues for body mechanics Scapular retraction with cervical rotation 10x 3-5 second holds    Workplace set up: educated on bringing laptop to height of monitors. Educated on posture of head with monitors and shoulders with keyboards.   Trigger Point Dry Needling (TDN), unbilled Education performed with patient regarding potential benefit of TDN. Reviewed precautions and risks with patient. Reviewed special precautions/risks over lung fields which include pneumothorax. Reviewed signs and symptoms of pneumothorax and advised pt to go to ER immediately if these symptoms develop advise them of dry needling treatment. Extensive time spent with pt to ensure full understanding of TDN risks. Pt provided verbal consent to treatment. TDN performed to  with 0.25 x 40 single needle placements with local twitch response (LTR). Pistoning technique utilized. Improved pain-free motion following intervention. Bilateral upper trap in prone position n x 5 minutes    PATIENT EDUCATION:  Education details: POC, purpose of PT, HEP, goals  Person educated: Patient Education method: Explanation, Demonstration, and Tactile cues Education comprehension: verbalized understanding, returned demonstration, tactile cues required, and needs further education  HOME EXERCISE PROGRAM: Formal HEP to be prescribed;  -bed mobility re: supine <> sidelying with bridge and elbows into bed with no cervical flexion / sidelying <> EOB with no cervical flexion   Access Code: [email or web address] URL: [email or web address] Date: 09/17/2022 Prepared by: [name]  Exercises - Seated Scapular Retraction  - 1 x daily - 7 x weekly - 2 sets - 10 reps - 5 hold - Supine Chin Tuck  - 1 x daily - 7 x weekly - 2 sets - 10 reps - 5 hold - Seated Upper Trapezius Stretch  - 1 x daily - 7 x weekly - 2 sets - 2 reps - 30 hold  ASSESSMENT:  CLINICAL IMPRESSION: Patient presents with excellent motivation throughout session. He has significant trigger point of R Upper trap this session.  Workplace set up educated on with focus on  correct posture alignment and retracted and depressed scapula for decreased aggravation at work place set .  Patient will benefit from skilled PT to address mobility, posture, flexibility, and strengthening to improve chronic headaches, QoL, and improve PLOF.   OBJECTIVE IMPAIRMENTS: Abnormal gait, decreased activity tolerance, decreased endurance, decreased mobility, decreased ROM, decreased strength, hypomobility, increased fascial restrictions, increased muscle spasms, impaired flexibility, impaired UE functional use, improper body mechanics, postural dysfunction, and pain.   ACTIVITY LIMITATIONS: carrying, lifting, bending, sitting, standing, squatting, sleeping, bed mobility, reach over head, and locomotion level  PARTICIPATION LIMITATIONS: community activity, occupation, and yard work  PERSONAL FACTORS: Behavior pattern, Fitness, Past/current experiences, Profession, Time since onset of injury/illness/exacerbation, and 1-2 comorbidities: HLD and cervicalgia  are also affecting patient's functional outcome.   REHAB POTENTIAL: Good  CLINICAL DECISION MAKING: Stable/uncomplicated  EVALUATION COMPLEXITY: Low   GOALS: Goals reviewed with patient? Yes  SHORT TERM GOALS: Target date: 10/10/2022  Patient will be independent in home exercise program to improve   strength/mobility for better functional independence with ADLs. Baseline: To be given. Goal status: INITIAL  2. Patient will demonstrate sidelying <> EOB bed mobility without cervical flexion to demonstrate decrease cervical strain and improved functional mobility.  Baseline:  rapid cervical flexion and sit up motion with holding of breath to sit up Goal status: INITIAL   LONG TERM GOALS: Target date: 11/07/2022   Patient will increase FOTO score to equal to or greater than ___ to demonstrate statistically significant improvement in mobility and quality of life.  Baseline: TBD Goal status: INITIAL  2.   Patient will report a  worst pain of 3/10 on NPRS in headaches to improve tolerance with ADLs and reduced symptoms with activities.   Baseline: worst: 10/10 Goal status: INITIAL  3.  Patient will report less < 2 / 5 on section 5 on NDI to indicate significant decrease in occurrence of headaches.  Baseline: 5/5 Goal status: INITIAL  4.  Patient will demonstrate upright posture in sitting and standing to decrease cervical musculature and upper trapezius resting tension.  Baseline: significant forward head posture, lumbar lordosis, unable to achieve thoracic extension to neutral  Goal status: INITIAL  5.  Patient will achieve full scapular range of motion for increased functional mobility to reach overhead with decrease compensatory muscle activation.  Baseline: no upward rotation past 90 degrees of shoulder abduction  Goal status: INITIAL     PLAN:  PT FREQUENCY: 1-2x/week  PT DURATION: 8 weeks  PLANNED INTERVENTIONS: Therapeutic exercises, Therapeutic activity, Neuromuscular re-education, Balance training, Gait training, Patient/Family education, Self Care, Joint mobilization, Joint manipulation, Vestibular training, Canalith repositioning, Orthotic/Fit training, Dry Needling, Electrical stimulation, Spinal manipulation, Spinal mobilization, Cryotherapy, Moist heat, scar mobilization, Splintting, Taping, Traction, Biofeedback, Manual therapy, and Re-evaluation  PLAN FOR NEXT SESSION: FOTO For cervical (not lumbar), manual therapy to B posterior / lateral ribcage and thoracic paraspinals, dry needling, postural re-ed & strengthening of thoracic extensors and cervical flexors     [name], PT 09/25/2022, 8:00 AM

## 2022-09-25 ENCOUNTER — Ambulatory Visit: Payer: BC Managed Care – PPO

## 2022-09-25 DIAGNOSIS — M546 Pain in thoracic spine: Secondary | ICD-10-CM | POA: Diagnosis not present

## 2022-09-25 DIAGNOSIS — M47812 Spondylosis without myelopathy or radiculopathy, cervical region: Secondary | ICD-10-CM | POA: Diagnosis not present

## 2022-09-25 DIAGNOSIS — M5412 Radiculopathy, cervical region: Secondary | ICD-10-CM | POA: Diagnosis not present

## 2022-09-25 DIAGNOSIS — M25511 Pain in right shoulder: Secondary | ICD-10-CM | POA: Diagnosis not present

## 2022-09-25 DIAGNOSIS — R293 Abnormal posture: Secondary | ICD-10-CM

## 2022-09-25 DIAGNOSIS — M4312 Spondylolisthesis, cervical region: Secondary | ICD-10-CM | POA: Diagnosis not present

## 2022-09-25 DIAGNOSIS — M542 Cervicalgia: Secondary | ICD-10-CM

## 2022-09-25 DIAGNOSIS — G8929 Other chronic pain: Secondary | ICD-10-CM | POA: Diagnosis not present

## 2022-09-25 MED ORDER — ATORVASTATIN CALCIUM 80 MG PO TABS: 80.0000 mg | ORAL_TABLET | Freq: Every day | ORAL | 3 refills | Status: DC

## 2022-09-25 NOTE — Addendum Note (Signed)
Addended by: Mort Sawyers on: 09/25/2022 11:43 AM   Modules accepted: Orders

## 2022-09-26 ENCOUNTER — Encounter: Payer: Self-pay | Admitting: Family

## 2022-09-26 ENCOUNTER — Ambulatory Visit: Payer: BC Managed Care – PPO | Admitting: Family

## 2022-09-26 VITALS — BP 128/84 | HR 58 | Temp 97.5°F | Ht 65.0 in | Wt 184.6 lb

## 2022-09-26 DIAGNOSIS — S0993XD Unspecified injury of face, subsequent encounter: Secondary | ICD-10-CM

## 2022-09-26 DIAGNOSIS — M47812 Spondylosis without myelopathy or radiculopathy, cervical region: Secondary | ICD-10-CM

## 2022-09-26 DIAGNOSIS — S0993XA Unspecified injury of face, initial encounter: Secondary | ICD-10-CM | POA: Insufficient documentation

## 2022-09-26 DIAGNOSIS — R519 Headache, unspecified: Secondary | ICD-10-CM | POA: Diagnosis not present

## 2022-09-26 DIAGNOSIS — R200 Anesthesia of skin: Secondary | ICD-10-CM | POA: Diagnosis not present

## 2022-09-26 NOTE — Assessment & Plan Note (Signed)
Chronic since fall 2023. Ongoing pain and numbness pt requesting further evaluation.  Dental workup negative.  Referral placed for oral surgeon. I do suspect this is result of the injury and may be chronic moving forward.  Sinusitis unlikely, reviewed CT maxillofacial from ER visit and unremarkable.  Did advise when flares can take ibuprofen and apply heat to face which may provide some relief.
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
## 2022-09-27 ENCOUNTER — Ambulatory Visit: Payer: BC Managed Care – PPO | Attending: Family

## 2022-09-27 DIAGNOSIS — G8929 Other chronic pain: Secondary | ICD-10-CM | POA: Insufficient documentation

## 2022-09-27 DIAGNOSIS — M542 Cervicalgia: Secondary | ICD-10-CM | POA: Diagnosis not present

## 2022-09-27 DIAGNOSIS — M546 Pain in thoracic spine: Secondary | ICD-10-CM | POA: Insufficient documentation

## 2022-09-27 DIAGNOSIS — R293 Abnormal posture: Secondary | ICD-10-CM | POA: Diagnosis not present

## 2022-09-27 DIAGNOSIS — M25511 Pain in right shoulder: Secondary | ICD-10-CM | POA: Insufficient documentation

## 2022-10-01 ENCOUNTER — Encounter: Payer: Self-pay | Admitting: *Deleted

## 2022-10-01 NOTE — Therapy (Signed)
OUTPATIENT PHYSICAL THERAPY CERVICAL TREATMENT   Patient Name: Jeremiah Carpenter MRN: [id number] DOB:1965/06/09, 57 y.o., male Today's Date: 10/02/2022  END OF SESSION:  PT End of Session - 10/02/22 0712     Visit Number 6    Number of Visits 16    Date for PT Re-Evaluation 11/07/22    PT Start Time 0714    PT Stop Time 0759    PT Time Calculation (min) 45 min    Activity Tolerance Patient tolerated treatment well    Behavior During Therapy [facility] for tasks assessed/performed                  Past Medical History:  Diagnosis Date   History of colon polyps    benign   History of kidney stones    History of migraine    last one about a month ago   Hyperlipidemia    takes Atorvastatin daily   Joint pain    Weakness    numbness mainly left hand and rarely on right   Past Surgical History:  Procedure Laterality Date   ANTERIOR CERVICAL DECOMP/DISCECTOMY FUSION N/A 01/11/2016   Procedure: C5-6, C6-7 Anterior Cervical Discectomy and Fusion, Allograft, Plate;  Surgeon: Eldred Manges, MD;  Location: [facility] OR;  Service: Orthopedics;  Laterality: N/A;   CARPAL TUNNEL RELEASE Bilateral    COLONOSCOPY     COLONOSCOPY WITH PROPOFOL N/A 01/25/2020   Procedure: COLONOSCOPY WITH PROPOFOL;  Surgeon: Wyline Mood, MD;  Location: [facility] ENDOSCOPY;  Service: Gastroenterology;  Laterality: N/A;   VASECTOMY     Patient Active Problem List   Diagnosis Date Noted   Right facial numbness 09/26/2022   Injury of face 09/26/2022   History of seborrheic keratosis 08/28/2022   Cervical spondylosis 09/06/2021   Anterolisthesis of cervical spine (C3-C4) 03/30/2021   Cervicalgia 03/07/2021   Chronic pain syndrome 03/07/2021   Tension headache 07/11/2020   S/P cervical spinal fusion 11/13/2017   Cervical stenosis of spine 01/11/2016   Hx of adenomatous polyp of colon 05/19/2014   Low testosterone 05/06/2012   HYPERCHOLESTEROLEMIA 06/21/2009    PCP: Mort Sawyers, FNP  REFERRING PROVIDER:  Mort Sawyers, FNP  REFERRING DIAG:   M43.12 (ICD-10-CM) - Anterolisthesis of cervical spine M47.812 (ICD-10-CM) - Cervical spondylosis M54.12 (ICD-10-CM) - Cervical radicular pain    THERAPY DIAG:  Cervicalgia  Abnormal posture  Pain in thoracic spine  Chronic right shoulder pain  Rationale for Evaluation and Treatment: Rehabilitation  ONSET DATE: ongoing > 6 months   SUBJECTIVE:  SUBJECTIVE STATEMENT: Patient reports soreness in neck for two days s/p next session.    Hand dominance: Right  PERTINENT HISTORY:  Pt is a 57 y.o. M with a PMH of HLD (medicated), joint pain, tension headaches, cervicalgia, and numbness of L hand. Pt has a surgical history of ACDF at two levels, C5-6, C6-7 in 2017, as well as B carpal tunnel release surgery. Pt has been a pt at [facility] pain clinic and PT clinic for chronic pain, and is requesting [name], DPT for dry needling.   PAIN:  Are you having pain? Yes: NPRS scale: 3/10 Pain location: head Pain description: dull, but progresses to severe migraine Aggravating factors: prolonged sitting, increased mobility  Relieving factors: lying down, standing up  Headache in the back of the head that is always there, and infrequently pain down into arm.  HA: worst 10/10, currently 3/10 (typical), best: 3/10  PRECAUTIONS: None  RED FLAGS: None     WEIGHT BEARING RESTRICTIONS: No  FALLS:  Has patient fallen in last 6 months? No  LIVING ENVIRONMENT: Lives with: lives with their family Lives in: House/apartment Stairs: Yes: Internal: 14 steps; can reach both and External: 3 steps; on right going up Has following equipment at home: None  OCCUPATION: Art gallery manager for [employer]  PLOF: Independent  PATIENT GOALS: "I want to get HA down to a 1/10,  and never have to have surgery again"  NEXT MD VISIT: n/a  OBJECTIVE:   PATIENT SURVEYS:  NDI 11/50 FOTO TBD  COGNITION: Overall cognitive status: Within functional limits for tasks assessed  SENSATION: WFL  POSTURE: rounded shoulders, forward head, increased lumbar lordosis, increased thoracic kyphosis, and flexed trunk   PALPATION: B SCM tension and pain R > L B cervical paraspinal tension and pain R > L  B thoracic paraspinal tension and pain B intercostal tension and pain    CERVICAL ROM:   Active ROM A/PROM (deg) eval  Flexion 35  Extension 30  Right lateral flexion 35  Left lateral flexion 30  Right rotation 35  Left rotation 25   (Blank rows = not tested)  UPPER EXTREMITY ROM:  Not formally assessed with goniometer, but appeared [facility] for all GHJ mobility.    UPPER EXTREMITY MMT:  MMT Right eval Left eval  Shoulder flexion 5 5  Shoulder extension 5 5  Shoulder abduction 5 5  Shoulder adduction 5 5  Shoulder internal rotation    Shoulder external rotation    Middle trapezius    Lower trapezius 4- 4-  Elbow flexion    Elbow extension     (Blank rows = not tested)    ACCESSORY MOTIONS:  B Rib mobility: hypomobile and painful  C2-T2 PA's: hypomobile with no springing, muscle guarding  Scapular mobility: hypomobile, minimal scapular rotation with > 90 deg shoulder abduction, scapulae adherence to ribcage    FUNCTIONAL TESTS:  N/A  TODAY'S TREATMENT:  DATE: 10/02/22   Manual: Grade II mobilizations cervical to mid thoracic spine UPA and CPA.  Suboccipital release 4x30 seconds Cervical side bend with overpressure 2x30 seconds each side J mobilizations 3x30 seconds Median nerve glide LUE 10x  LUE distraction 4x20 seconds  TherEx: GTB Y 15x GTB ER 15x  Row with cable: 10x ; #17.5; 2 sets semitandem stance Wall  abduction 12x L stretch 5x20 seconds    Trigger Point Dry Needling (TDN), unbilled Education performed with patient regarding potential benefit of TDN. Reviewed precautions and risks with patient. Reviewed special precautions/risks over lung fields which include pneumothorax. Reviewed signs and symptoms of pneumothorax and advised pt to go to ER immediately if these symptoms develop advise them of dry needling treatment. Extensive time spent with pt to ensure full understanding of TDN risks. Pt provided verbal consent to treatment. TDN performed to  with 0.25 x 40 single needle placements with local twitch response (LTR). Pistoning technique utilized. Improved pain-free motion following intervention. Bilateral upper trap, suboccipital in prone position  x 5 minutes    PATIENT EDUCATION:  Education details: POC, purpose of PT, HEP, goals  Person educated: Patient Education method: Explanation, Demonstration, and Tactile cues Education comprehension: verbalized understanding, returned demonstration, tactile cues required, and needs further education  HOME EXERCISE PROGRAM: Formal HEP to be prescribed;  -bed mobility re: supine <> sidelying with bridge and elbows into bed with no cervical flexion / sidelying <> EOB with no cervical flexion   Access Code: [email or web address] URL: [email or web address] Date: 09/17/2022 Prepared by: [name]  Exercises - Seated Scapular Retraction  - 1 x daily - 7 x weekly - 2 sets - 10 reps - 5 hold - Supine Chin Tuck  - 1 x daily - 7 x weekly - 2 sets - 10 reps - 5 hold - Seated Upper Trapezius Stretch  - 1 x daily - 7 x weekly - 2 sets - 2 reps - 30 hold  ASSESSMENT:  CLINICAL IMPRESSION:  Patient presents with good motivation. Patient requires cueing for postural alinement. Large trigger points in bilateral upper traps released with TDN this session. Patient will benefit from skilled PT to address mobility, posture, flexibility, and strengthening  to improve chronic headaches, QoL, and improve PLOF.   OBJECTIVE IMPAIRMENTS: Abnormal gait, decreased activity tolerance, decreased endurance, decreased mobility, decreased ROM, decreased strength, hypomobility, increased fascial restrictions, increased muscle spasms, impaired flexibility, impaired UE functional use, improper body mechanics, postural dysfunction, and pain.   ACTIVITY LIMITATIONS: carrying, lifting, bending, sitting, standing, squatting, sleeping, bed mobility, reach over head, and locomotion level  PARTICIPATION LIMITATIONS: community activity, occupation, and yard work  PERSONAL FACTORS: Behavior pattern, Fitness, Past/current experiences, Profession, Time since onset of injury/illness/exacerbation, and 1-2 comorbidities: HLD and cervicalgia  are also affecting patient's functional outcome.   REHAB POTENTIAL: Good  CLINICAL DECISION MAKING: Stable/uncomplicated  EVALUATION COMPLEXITY: Low   GOALS: Goals reviewed with patient? Yes  SHORT TERM GOALS: Target date: 10/10/2022  Patient will be independent in home exercise program to improve   strength/mobility for better functional independence with ADLs. Baseline: To be given. Goal status: INITIAL  2. Patient will demonstrate sidelying <> EOB bed mobility without cervical flexion to demonstrate decrease cervical strain and improved functional mobility.  Baseline: rapid cervical flexion and sit up motion with holding of breath to sit up Goal status: INITIAL   LONG TERM GOALS: Target date: 11/07/2022   Patient will increase FOTO score to equal to or greater than ___ to demonstrate statistically  significant improvement in mobility and quality of life.  Baseline: TBD Goal status: INITIAL  2.   Patient will report a worst pain of 3/10 on NPRS in headaches to improve tolerance with ADLs and reduced symptoms with activities.   Baseline: worst: 10/10 Goal status: INITIAL  3.  Patient will report less < 2 / 5 on section  5 on NDI to indicate significant decrease in occurrence of headaches.  Baseline: 5/5 Goal status: INITIAL  4.  Patient will demonstrate upright posture in sitting and standing to decrease cervical musculature and upper trapezius resting tension.  Baseline: significant forward head posture, lumbar lordosis, unable to achieve thoracic extension to neutral  Goal status: INITIAL  5.  Patient will achieve full scapular range of motion for increased functional mobility to reach overhead with decrease compensatory muscle activation.  Baseline: no upward rotation past 90 degrees of shoulder abduction  Goal status: INITIAL     PLAN:  PT FREQUENCY: 1-2x/week  PT DURATION: 8 weeks  PLANNED INTERVENTIONS: Therapeutic exercises, Therapeutic activity, Neuromuscular re-education, Balance training, Gait training, Patient/Family education, Self Care, Joint mobilization, Joint manipulation, Vestibular training, Canalith repositioning, Orthotic/Fit training, Dry Needling, Electrical stimulation, Spinal manipulation, Spinal mobilization, Cryotherapy, Moist heat, scar mobilization, Splintting, Taping, Traction, Biofeedback, Manual therapy, and Re-evaluation  PLAN FOR NEXT SESSION: FOTO For cervical (not lumbar), manual therapy to B posterior / lateral ribcage and thoracic paraspinals, dry needling, postural re-ed & strengthening of thoracic extensors and cervical flexors     [name], PT 10/02/2022, 7:59 AM

## 2022-10-02 ENCOUNTER — Ambulatory Visit: Payer: BC Managed Care – PPO

## 2022-10-02 DIAGNOSIS — G8929 Other chronic pain: Secondary | ICD-10-CM | POA: Diagnosis not present

## 2022-10-02 DIAGNOSIS — M546 Pain in thoracic spine: Secondary | ICD-10-CM | POA: Diagnosis not present

## 2022-10-02 DIAGNOSIS — M542 Cervicalgia: Secondary | ICD-10-CM | POA: Diagnosis not present

## 2022-10-02 DIAGNOSIS — R293 Abnormal posture: Secondary | ICD-10-CM | POA: Diagnosis not present

## 2022-10-02 DIAGNOSIS — M25511 Pain in right shoulder: Secondary | ICD-10-CM | POA: Diagnosis not present

## 2022-10-03 NOTE — Therapy (Unsigned)
OUTPATIENT PHYSICAL THERAPY CERVICAL TREATMENT   Patient Name: Jeremiah Carpenter MRN: [id number] DOB:1965/12/02, 57 y.o., male Today's Date: 10/03/2022  END OF SESSION:         Past Medical History:  Diagnosis Date   History of colon polyps    benign   History of kidney stones    History of migraine    last one about a month ago   Hyperlipidemia    takes Atorvastatin daily   Joint pain    Weakness    numbness mainly left hand and rarely on right   Past Surgical History:  Procedure Laterality Date   ANTERIOR CERVICAL DECOMP/DISCECTOMY FUSION N/A 01/11/2016   Procedure: C5-6, C6-7 Anterior Cervical Discectomy and Fusion, Allograft, Plate;  Surgeon: Eldred Manges, MD;  Location: [facility] OR;  Service: Orthopedics;  Laterality: N/A;   CARPAL TUNNEL RELEASE Bilateral    COLONOSCOPY     COLONOSCOPY WITH PROPOFOL N/A 01/25/2020   Procedure: COLONOSCOPY WITH PROPOFOL;  Surgeon: Wyline Mood, MD;  Location: [facility] ENDOSCOPY;  Service: Gastroenterology;  Laterality: N/A;   VASECTOMY     Patient Active Problem List   Diagnosis Date Noted   Right facial numbness 09/26/2022   Injury of face 09/26/2022   History of seborrheic keratosis 08/28/2022   Cervical spondylosis 09/06/2021   Anterolisthesis of cervical spine (C3-C4) 03/30/2021   Cervicalgia 03/07/2021   Chronic pain syndrome 03/07/2021   Tension headache 07/11/2020   S/P cervical spinal fusion 11/13/2017   Cervical stenosis of spine 01/11/2016   Hx of adenomatous polyp of colon 05/19/2014   Low testosterone 05/06/2012   HYPERCHOLESTEROLEMIA 06/21/2009    PCP: Mort Sawyers, FNP  REFERRING PROVIDER: Mort Sawyers, FNP  REFERRING DIAG:   M43.12 (ICD-10-CM) - Anterolisthesis of cervical spine M47.812 (ICD-10-CM) - Cervical spondylosis M54.12 (ICD-10-CM) - Cervical radicular pain    THERAPY DIAG:  No diagnosis found.  Rationale for Evaluation and Treatment: Rehabilitation  ONSET DATE: ongoing > 6 months    SUBJECTIVE:                                                                                                                                                                                                         SUBJECTIVE STATEMENT: Patient reports soreness in neck for two days s/p next session.    Hand dominance: Right  PERTINENT HISTORY:  Pt is a 57 y.o. M with a PMH of HLD (medicated), joint pain, tension headaches, cervicalgia, and numbness of L hand. Pt has a surgical history of ACDF at two levels, C5-6, C6-7 in 2017,  as well as B carpal tunnel release surgery. Pt has been a pt at [facility] pain clinic and PT clinic for chronic pain, and is requesting [name], DPT for dry needling.   PAIN:  Are you having pain? Yes: NPRS scale: 3/10 Pain location: head Pain description: dull, but progresses to severe migraine Aggravating factors: prolonged sitting, increased mobility  Relieving factors: lying down, standing up  Headache in the back of the head that is always there, and infrequently pain down into arm.  HA: worst 10/10, currently 3/10 (typical), best: 3/10  PRECAUTIONS: None  RED FLAGS: None     WEIGHT BEARING RESTRICTIONS: No  FALLS:  Has patient fallen in last 6 months? No  LIVING ENVIRONMENT: Lives with: lives with their family Lives in: House/apartment Stairs: Yes: Internal: 14 steps; can reach both and External: 3 steps; on right going up Has following equipment at home: None  OCCUPATION: Art gallery manager for [employer]  PLOF: Independent  PATIENT GOALS: "I want to get HA down to a 1/10, and never have to have surgery again"  NEXT MD VISIT: n/a  OBJECTIVE:   PATIENT SURVEYS:  NDI 11/50 FOTO TBD  COGNITION: Overall cognitive status: Within functional limits for tasks assessed  SENSATION: WFL  POSTURE: rounded shoulders, forward head, increased lumbar lordosis, increased thoracic kyphosis, and flexed trunk   PALPATION: B SCM tension and pain R > L B  cervical paraspinal tension and pain R > L  B thoracic paraspinal tension and pain B intercostal tension and pain    CERVICAL ROM:   Active ROM A/PROM (deg) eval  Flexion 35  Extension 30  Right lateral flexion 35  Left lateral flexion 30  Right rotation 35  Left rotation 25   (Blank rows = not tested)  UPPER EXTREMITY ROM:  Not formally assessed with goniometer, but appeared [facility] for all GHJ mobility.    UPPER EXTREMITY MMT:  MMT Right eval Left eval  Shoulder flexion 5 5  Shoulder extension 5 5  Shoulder abduction 5 5  Shoulder adduction 5 5  Shoulder internal rotation    Shoulder external rotation    Middle trapezius    Lower trapezius 4- 4-  Elbow flexion    Elbow extension     (Blank rows = not tested)    ACCESSORY MOTIONS:  B Rib mobility: hypomobile and painful  C2-T2 PA's: hypomobile with no springing, muscle guarding  Scapular mobility: hypomobile, minimal scapular rotation with > 90 deg shoulder abduction, scapulae adherence to ribcage    FUNCTIONAL TESTS:  N/A  TODAY'S TREATMENT:                                                                                                                              DATE: 10/03/22   FOTO ***  Manual: Grade II mobilizations cervical to mid thoracic spine UPA and CPA.  Suboccipital release 4x30 seconds Cervical side bend with overpressure 2x30 seconds each side J mobilizations 3x30  seconds Median nerve glide LUE 10x  LUE distraction 4x20 seconds  TherEx: GTB Y 15x GTB ER 15x  Row with cable: 10x ; #17.5; 2 sets semitandem stance Wall abduction 12x L stretch 5x20 seconds      PATIENT EDUCATION:  Education details: POC, purpose of PT, HEP, goals  Person educated: Patient Education method: Explanation, Demonstration, and Tactile cues Education comprehension: verbalized understanding, returned demonstration, tactile cues required, and needs further education  HOME EXERCISE PROGRAM: Formal HEP to be  prescribed;  -bed mobility re: supine <> sidelying with bridge and elbows into bed with no cervical flexion / sidelying <> EOB with no cervical flexion   Access Code: [email or web address] URL: [email or web address] Date: 09/17/2022 Prepared by: [name]  Exercises - Seated Scapular Retraction  - 1 x daily - 7 x weekly - 2 sets - 10 reps - 5 hold - Supine Chin Tuck  - 1 x daily - 7 x weekly - 2 sets - 10 reps - 5 hold - Seated Upper Trapezius Stretch  - 1 x daily - 7 x weekly - 2 sets - 2 reps - 30 hold  ASSESSMENT:  CLINICAL IMPRESSION:  Patient presents with good motivation. Patient requires cueing for postural alinement. Large trigger points in bilateral upper traps released with TDN this session. Patient will benefit from skilled PT to address mobility, posture, flexibility, and strengthening to improve chronic headaches, QoL, and improve PLOF.   OBJECTIVE IMPAIRMENTS: Abnormal gait, decreased activity tolerance, decreased endurance, decreased mobility, decreased ROM, decreased strength, hypomobility, increased fascial restrictions, increased muscle spasms, impaired flexibility, impaired UE functional use, improper body mechanics, postural dysfunction, and pain.   ACTIVITY LIMITATIONS: carrying, lifting, bending, sitting, standing, squatting, sleeping, bed mobility, reach over head, and locomotion level  PARTICIPATION LIMITATIONS: community activity, occupation, and yard work  PERSONAL FACTORS: Behavior pattern, Fitness, Past/current experiences, Profession, Time since onset of injury/illness/exacerbation, and 1-2 comorbidities: HLD and cervicalgia  are also affecting patient's functional outcome.   REHAB POTENTIAL: Good  CLINICAL DECISION MAKING: Stable/uncomplicated  EVALUATION COMPLEXITY: Low   GOALS: Goals reviewed with patient? Yes  SHORT TERM GOALS: Target date: 10/10/2022  Patient will be independent in home exercise program to improve   strength/mobility for  better functional independence with ADLs. Baseline: To be given. Goal status: INITIAL  2. Patient will demonstrate sidelying <> EOB bed mobility without cervical flexion to demonstrate decrease cervical strain and improved functional mobility.  Baseline: rapid cervical flexion and sit up motion with holding of breath to sit up Goal status: INITIAL   LONG TERM GOALS: Target date: 11/07/2022   Patient will increase FOTO score to equal to or greater than ___ to demonstrate statistically significant improvement in mobility and quality of life.  Baseline: TBD Goal status: INITIAL  2.   Patient will report a worst pain of 3/10 on NPRS in headaches to improve tolerance with ADLs and reduced symptoms with activities.   Baseline: worst: 10/10 Goal status: INITIAL  3.  Patient will report less < 2 / 5 on section 5 on NDI to indicate significant decrease in occurrence of headaches.  Baseline: 5/5 Goal status: INITIAL  4.  Patient will demonstrate upright posture in sitting and standing to decrease cervical musculature and upper trapezius resting tension.  Baseline: significant forward head posture, lumbar lordosis, unable to achieve thoracic extension to neutral  Goal status: INITIAL  5.  Patient will achieve full scapular range of motion for increased functional mobility to reach overhead with decrease compensatory muscle activation.  Baseline: no upward rotation past 90 degrees of shoulder abduction  Goal status: INITIAL     PLAN:  PT FREQUENCY: 1-2x/week  PT DURATION: 8 weeks  PLANNED INTERVENTIONS: Therapeutic exercises, Therapeutic activity, Neuromuscular re-education, Balance training, Gait training, Patient/Family education, Self Care, Joint mobilization, Joint manipulation, Vestibular training, Canalith repositioning, Orthotic/Fit training, Dry Needling, Electrical stimulation, Spinal manipulation, Spinal mobilization, Cryotherapy, Moist heat, scar mobilization, Splintting,  Taping, Traction, Biofeedback, Manual therapy, and Re-evaluation  PLAN FOR NEXT SESSION: FOTO For cervical (not lumbar), manual therapy to B posterior / lateral ribcage and thoracic paraspinals, dry needling, postural re-ed & strengthening of thoracic extensors and cervical flexors     Norman Herrlich, PT 10/03/2022, 12:58 PM

## 2022-10-04 ENCOUNTER — Ambulatory Visit: Payer: BC Managed Care – PPO | Admitting: Physical Therapy

## 2022-10-04 DIAGNOSIS — M546 Pain in thoracic spine: Secondary | ICD-10-CM

## 2022-10-04 DIAGNOSIS — G8929 Other chronic pain: Secondary | ICD-10-CM | POA: Diagnosis not present

## 2022-10-04 DIAGNOSIS — M25511 Pain in right shoulder: Secondary | ICD-10-CM | POA: Diagnosis not present

## 2022-10-04 DIAGNOSIS — M542 Cervicalgia: Secondary | ICD-10-CM

## 2022-10-04 DIAGNOSIS — R293 Abnormal posture: Secondary | ICD-10-CM

## 2022-10-04 NOTE — Therapy (Signed)
OUTPATIENT PHYSICAL THERAPY CERVICAL TREATMENT   Patient Name: Jeremiah Carpenter MRN: [id number] DOB:Mar 23, 1965, 57 y.o., male Today's Date: 10/08/2022  END OF SESSION:  PT End of Session - 10/08/22 0713     Visit Number 8    Number of Visits 16    Date for PT Re-Evaluation 11/07/22    Progress Note Due on Visit 10    PT Start Time 0714    PT Stop Time 0758    PT Time Calculation (min) 44 min    Activity Tolerance Patient tolerated treatment well    Behavior During Therapy [facility] for tasks assessed/performed                    Past Medical History:  Diagnosis Date   History of colon polyps    benign   History of kidney stones    History of migraine    last one about a month ago   Hyperlipidemia    takes Atorvastatin daily   Joint pain    Weakness    numbness mainly left hand and rarely on right   Past Surgical History:  Procedure Laterality Date   ANTERIOR CERVICAL DECOMP/DISCECTOMY FUSION N/A 01/11/2016   Procedure: C5-6, C6-7 Anterior Cervical Discectomy and Fusion, Allograft, Plate;  Surgeon: Eldred Manges, MD;  Location: [facility] OR;  Service: Orthopedics;  Laterality: N/A;   CARPAL TUNNEL RELEASE Bilateral    COLONOSCOPY     COLONOSCOPY WITH PROPOFOL N/A 01/25/2020   Procedure: COLONOSCOPY WITH PROPOFOL;  Surgeon: Wyline Mood, MD;  Location: [facility] ENDOSCOPY;  Service: Gastroenterology;  Laterality: N/A;   VASECTOMY     Patient Active Problem List   Diagnosis Date Noted   Right facial numbness 09/26/2022   Injury of face 09/26/2022   History of seborrheic keratosis 08/28/2022   Cervical spondylosis 09/06/2021   Anterolisthesis of cervical spine (C3-C4) 03/30/2021   Cervicalgia 03/07/2021   Chronic pain syndrome 03/07/2021   Tension headache 07/11/2020   S/P cervical spinal fusion 11/13/2017   Cervical stenosis of spine 01/11/2016   Hx of adenomatous polyp of colon 05/19/2014   Low testosterone 05/06/2012   HYPERCHOLESTEROLEMIA 06/21/2009    PCP:  Mort Sawyers, FNP  REFERRING PROVIDER: Mort Sawyers, FNP  REFERRING DIAG:   M43.12 (ICD-10-CM) - Anterolisthesis of cervical spine M47.812 (ICD-10-CM) - Cervical spondylosis M54.12 (ICD-10-CM) - Cervical radicular pain    THERAPY DIAG:  Cervicalgia  Abnormal posture  Pain in thoracic spine  Rationale for Evaluation and Treatment: Rehabilitation  ONSET DATE: ongoing > 6 months   SUBJECTIVE:  SUBJECTIVE STATEMENT:  Patient reports his upper traps are tight today but his neck is feeling better.   Hand dominance: Right  PERTINENT HISTORY:  Pt is a 57 y.o. M with a PMH of HLD (medicated), joint pain, tension headaches, cervicalgia, and numbness of L hand. Pt has a surgical history of ACDF at two levels, C5-6, C6-7 in 2017, as well as B carpal tunnel release surgery. Pt has been a pt at [facility] pain clinic and PT clinic for chronic pain, and is requesting [name], DPT for dry needling.   PAIN:  Are you having pain? Yes: NPRS scale: 3/10 Pain location: head Pain description: dull, but progresses to severe migraine Aggravating factors: prolonged sitting, increased mobility  Relieving factors: lying down, standing up  Headache in the back of the head that is always there, and infrequently pain down into arm.  HA: worst 10/10, currently 3/10 (typical), best: 3/10  PRECAUTIONS: None  RED FLAGS: None     WEIGHT BEARING RESTRICTIONS: No  FALLS:  Has patient fallen in last 6 months? No  LIVING ENVIRONMENT: Lives with: lives with their family Lives in: House/apartment Stairs: Yes: Internal: 14 steps; can reach both and External: 3 steps; on right going up Has following equipment at home: None  OCCUPATION: Art gallery manager for [employer]  PLOF: Independent  PATIENT GOALS: "I want  to get HA down to a 1/10, and never have to have surgery again"  NEXT MD VISIT: n/a  OBJECTIVE:   PATIENT SURVEYS:  NDI 11/50 FOTO TBD  COGNITION: Overall cognitive status: Within functional limits for tasks assessed  SENSATION: WFL  POSTURE: rounded shoulders, forward head, increased lumbar lordosis, increased thoracic kyphosis, and flexed trunk   PALPATION: B SCM tension and pain R > L B cervical paraspinal tension and pain R > L  B thoracic paraspinal tension and pain B intercostal tension and pain    CERVICAL ROM:   Active ROM A/PROM (deg) eval  Flexion 35  Extension 30  Right lateral flexion 35  Left lateral flexion 30  Right rotation 35  Left rotation 25   (Blank rows = not tested)  UPPER EXTREMITY ROM:  Not formally assessed with goniometer, but appeared [facility] for all GHJ mobility.    UPPER EXTREMITY MMT:  MMT Right eval Left eval  Shoulder flexion 5 5  Shoulder extension 5 5  Shoulder abduction 5 5  Shoulder adduction 5 5  Shoulder internal rotation    Shoulder external rotation    Middle trapezius    Lower trapezius 4- 4-  Elbow flexion    Elbow extension     (Blank rows = not tested)    ACCESSORY MOTIONS:  B Rib mobility: hypomobile and painful  C2-T2 PA's: hypomobile with no springing, muscle guarding  Scapular mobility: hypomobile, minimal scapular rotation with > 90 deg shoulder abduction, scapulae adherence to ribcage    FUNCTIONAL TESTS:  N/A  TODAY'S TREATMENT:  DATE: 10/08/22   Manual:  Cervical side bend with overpressure 2x30 seconds each side J mobilizations 3x30 seconds Suboccipital release 4x30 seconds  Grade II mobilizations cervical to mid thoracic spine UPA and CPA.  Grade 4 mobilizations of T spine with multiple cavitations in mid thoracic spine. X 2 min superior to inferior  TherEx: GTB  overhead Y 15x  GTB ER 15x  GTB lat pull down supine 12 x each LE Robber stretch 30 seconds  Row with cable: 10x ; #22.5; 2 sets semitandem stance Robber stretch 30 seconds Pec stretch in corner 30 seconds x 3 trials   Trigger Point Dry Needling (TDN), unbilled Education performed with patient regarding potential benefit of TDN. Reviewed precautions and risks with patient. Reviewed special precautions/risks over lung fields which include pneumothorax. Reviewed signs and symptoms of pneumothorax and advised pt to go to ER immediately if these symptoms develop advise them of dry needling treatment. Extensive time spent with pt to ensure full understanding of TDN risks. Pt provided verbal consent to treatment. TDN performed to  with 0.25 x 40 single needle placements with local twitch response (LTR). Pistoning technique utilized. Improved pain-free motion following intervention. Bilateral upper trap, x3 minutes    PATIENT EDUCATION:  Education details: POC, purpose of PT, HEP, goals  Person educated: Patient Education method: Explanation, Demonstration, and Tactile cues Education comprehension: verbalized understanding, returned demonstration, tactile cues required, and needs further education  HOME EXERCISE PROGRAM: Formal HEP to be prescribed;  -bed mobility re: supine <> sidelying with bridge and elbows into bed with no cervical flexion / sidelying <> EOB with no cervical flexion   Access Code: [email or web address] URL: [email or web address] Date: 09/17/2022 Prepared by: [name]  Exercises - Seated Scapular Retraction  - 1 x daily - 7 x weekly - 2 sets - 10 reps - 5 hold - Supine Chin Tuck  - 1 x daily - 7 x weekly - 2 sets - 10 reps - 5 hold - Seated Upper Trapezius Stretch  - 1 x daily - 7 x weekly - 2 sets - 2 reps - 30 hold  ASSESSMENT:  CLINICAL IMPRESSION:  Patient has significant trigger points of bilateral upper traps. Postural strengthening tolerated well. He is  highly motivated throughout session. Cues for scapular depression required with standing rows. Standing pec stretch tolerated well .Pt will continue to benefit from skilled physical therapy intervention to address impairments, improve QOL, and attain therapy goals.     OBJECTIVE IMPAIRMENTS: Abnormal gait, decreased activity tolerance, decreased endurance, decreased mobility, decreased ROM, decreased strength, hypomobility, increased fascial restrictions, increased muscle spasms, impaired flexibility, impaired UE functional use, improper body mechanics, postural dysfunction, and pain.   ACTIVITY LIMITATIONS: carrying, lifting, bending, sitting, standing, squatting, sleeping, bed mobility, reach over head, and locomotion level  PARTICIPATION LIMITATIONS: community activity, occupation, and yard work  PERSONAL FACTORS: Behavior pattern, Fitness, Past/current experiences, Profession, Time since onset of injury/illness/exacerbation, and 1-2 comorbidities: HLD and cervicalgia  are also affecting patient's functional outcome.   REHAB POTENTIAL: Good  CLINICAL DECISION MAKING: Stable/uncomplicated  EVALUATION COMPLEXITY: Low   GOALS: Goals reviewed with patient? Yes  SHORT TERM GOALS: Target date: 10/10/2022  Patient will be independent in home exercise program to improve   strength/mobility for better functional independence with ADLs. Baseline: To be given. Goal status: INITIAL  2. Patient will demonstrate sidelying <> EOB bed mobility without cervical flexion to demonstrate decrease cervical strain and improved functional mobility.  Baseline: rapid cervical flexion and sit up motion  with holding of breath to sit up Goal status: INITIAL   LONG TERM GOALS: Target date: 11/07/2022   Patient will increase FOTO score to equal to or greater than ___ to demonstrate statistically significant improvement in mobility and quality of life.  Baseline: TBD Goal status: INITIAL  2.   Patient will  report a worst pain of 3/10 on NPRS in headaches to improve tolerance with ADLs and reduced symptoms with activities.   Baseline: worst: 10/10 Goal status: INITIAL  3.  Patient will report less < 2 / 5 on section 5 on NDI to indicate significant decrease in occurrence of headaches.  Baseline: 5/5 Goal status: INITIAL  4.  Patient will demonstrate upright posture in sitting and standing to decrease cervical musculature and upper trapezius resting tension.  Baseline: significant forward head posture, lumbar lordosis, unable to achieve thoracic extension to neutral  Goal status: INITIAL  5.  Patient will achieve full scapular range of motion for increased functional mobility to reach overhead with decrease compensatory muscle activation.  Baseline: no upward rotation past 90 degrees of shoulder abduction  Goal status: INITIAL     PLAN:  PT FREQUENCY: 1-2x/week  PT DURATION: 8 weeks  PLANNED INTERVENTIONS: Therapeutic exercises, Therapeutic activity, Neuromuscular re-education, Balance training, Gait training, Patient/Family education, Self Care, Joint mobilization, Joint manipulation, Vestibular training, Canalith repositioning, Orthotic/Fit training, Dry Needling, Electrical stimulation, Spinal manipulation, Spinal mobilization, Cryotherapy, Moist heat, scar mobilization, Splintting, Taping, Traction, Biofeedback, Manual therapy, and Re-evaluation  PLAN FOR NEXT SESSION: FOTO For cervical (not lumbar), manual therapy to B posterior / lateral ribcage and thoracic paraspinals, dry needling, postural re-ed & strengthening of thoracic extensors and cervical flexors     [name], PT 10/08/2022, 8:00 AM

## 2022-10-04 NOTE — Therapy (Signed)
OUTPATIENT PHYSICAL THERAPY CERVICAL TREATMENT   Patient Name: Jeremiah Carpenter MRN: [id number] DOB:[birth year], 57 y.o., male Today's Date: 10/04/2022  END OF SESSION:  PT End of Session - 10/04/22 0800     Visit Number 7    Number of Visits 16    Date for PT Re-Evaluation 11/07/22    Progress Note Due on Visit 10    PT Start Time 0800    PT Stop Time 0842    PT Time Calculation (min) 42 min    Activity Tolerance Patient tolerated treatment well    Behavior During Therapy [facility] for tasks assessed/performed                   Past Medical History:  Diagnosis Date   History of colon polyps    benign   History of kidney stones    History of migraine    last one about a month ago   Hyperlipidemia    takes Atorvastatin daily   Joint pain    Weakness    numbness mainly left hand and rarely on right   Past Surgical History:  Procedure Laterality Date   ANTERIOR CERVICAL DECOMP/DISCECTOMY FUSION N/A 01/11/2016   Procedure: C5-6, C6-7 Anterior Cervical Discectomy and Fusion, Allograft, Plate;  Surgeon: Eldred Manges, MD;  Location: [facility] OR;  Service: Orthopedics;  Laterality: N/A;   CARPAL TUNNEL RELEASE Bilateral    COLONOSCOPY     COLONOSCOPY WITH PROPOFOL N/A 01/25/2020   Procedure: COLONOSCOPY WITH PROPOFOL;  Surgeon: Wyline Mood, MD;  Location: [facility] ENDOSCOPY;  Service: Gastroenterology;  Laterality: N/A;   VASECTOMY     Patient Active Problem List   Diagnosis Date Noted   Right facial numbness 09/26/2022   Injury of face 09/26/2022   History of seborrheic keratosis 08/28/2022   Cervical spondylosis 09/06/2021   Anterolisthesis of cervical spine (C3-C4) 03/30/2021   Cervicalgia 03/07/2021   Chronic pain syndrome 03/07/2021   Tension headache 07/11/2020   S/P cervical spinal fusion 11/13/2017   Cervical stenosis of spine 01/11/2016   Hx of adenomatous polyp of colon 05/19/2014   Low testosterone 05/06/2012   HYPERCHOLESTEROLEMIA 06/21/2009    PCP: Mort Sawyers, FNP  REFERRING PROVIDER: Mort Sawyers, FNP  REFERRING DIAG:   M43.12 (ICD-10-CM) - Anterolisthesis of cervical spine M47.812 (ICD-10-CM) - Cervical spondylosis M54.12 (ICD-10-CM) - Cervical radicular pain    THERAPY DIAG:  Cervicalgia  Abnormal posture  Pain in thoracic spine  Chronic right shoulder pain  Rationale for Evaluation and Treatment: Rehabilitation  ONSET DATE: ongoing > 6 months   SUBJECTIVE:  SUBJECTIVE STATEMENT:  Patient reports soreness following dry needling but no changes since last session. Pain still 3/10.   Hand dominance: Right  PERTINENT HISTORY:  Pt is a 57 y.o. M with a PMH of HLD (medicated), joint pain, tension headaches, cervicalgia, and numbness of L hand. Pt has a surgical history of ACDF at two levels, C5-6, C6-7 in 2017, as well as B carpal tunnel release surgery. Pt has been a pt at [facility] pain clinic and PT clinic for chronic pain, and is requesting [name], DPT for dry needling.   PAIN:  Are you having pain? Yes: NPRS scale: 3/10 Pain location: head Pain description: dull, but progresses to severe migraine Aggravating factors: prolonged sitting, increased mobility  Relieving factors: lying down, standing up  Headache in the back of the head that is always there, and infrequently pain down into arm.  HA: worst 10/10, currently 3/10 (typical), best: 3/10  PRECAUTIONS: None  RED FLAGS: None     WEIGHT BEARING RESTRICTIONS: No  FALLS:  Has patient fallen in last 6 months? No  LIVING ENVIRONMENT: Lives with: lives with their family Lives in: House/apartment Stairs: Yes: Internal: 14 steps; can reach both and External: 3 steps; on right going up Has following equipment at home: None  OCCUPATION: Art gallery manager for  [employer]  PLOF: Independent  PATIENT GOALS: "I want to get HA down to a 1/10, and never have to have surgery again"  NEXT MD VISIT: n/a  OBJECTIVE:   PATIENT SURVEYS:  NDI 11/50 FOTO TBD  COGNITION: Overall cognitive status: Within functional limits for tasks assessed  SENSATION: WFL  POSTURE: rounded shoulders, forward head, increased lumbar lordosis, increased thoracic kyphosis, and flexed trunk   PALPATION: B SCM tension and pain R > L B cervical paraspinal tension and pain R > L  B thoracic paraspinal tension and pain B intercostal tension and pain    CERVICAL ROM:   Active ROM A/PROM (deg) eval  Flexion 35  Extension 30  Right lateral flexion 35  Left lateral flexion 30  Right rotation 35  Left rotation 25   (Blank rows = not tested)  UPPER EXTREMITY ROM:  Not formally assessed with goniometer, but appeared [facility] for all GHJ mobility.    UPPER EXTREMITY MMT:  MMT Right eval Left eval  Shoulder flexion 5 5  Shoulder extension 5 5  Shoulder abduction 5 5  Shoulder adduction 5 5  Shoulder internal rotation    Shoulder external rotation    Middle trapezius    Lower trapezius 4- 4-  Elbow flexion    Elbow extension     (Blank rows = not tested)    ACCESSORY MOTIONS:  B Rib mobility: hypomobile and painful  C2-T2 PA's: hypomobile with no springing, muscle guarding  Scapular mobility: hypomobile, minimal scapular rotation with > 90 deg shoulder abduction, scapulae adherence to ribcage    FUNCTIONAL TESTS:  N/A  TODAY'S TREATMENT:  DATE: 10/04/22   Manual:  Cervical side bend with overpressure 2x30 seconds each side J mobilizations 3x30 seconds Suboccipital release 4x30 seconds Ischemic TP release for B UT musculature x 5 min  Grade II mobilizations cervical to mid thoracic spine UPA and CPA.  Grade 4 mobilizations of T  spine with multiple cavitations in mid thoracic spine. X 2 min superior to inferior  TherEx:  Prone "I" x 10  GTB ER 15x  Row with cable: 10x ; #22.5; 2 sets semitandem stance Ball roll out for thoracic extension x 10 with 5 sec hold       PATIENT EDUCATION:  Education details: POC, purpose of PT, HEP, goals  Person educated: Patient Education method: Explanation, Demonstration, and Tactile cues Education comprehension: verbalized understanding, returned demonstration, tactile cues required, and needs further education  HOME EXERCISE PROGRAM: Formal HEP to be prescribed;  -bed mobility re: supine <> sidelying with bridge and elbows into bed with no cervical flexion / sidelying <> EOB with no cervical flexion   Access Code: [email or web address] URL: [email or web address] Date: 09/17/2022 Prepared by: [name]  Exercises - Seated Scapular Retraction  - 1 x daily - 7 x weekly - 2 sets - 10 reps - 5 hold - Supine Chin Tuck  - 1 x daily - 7 x weekly - 2 sets - 10 reps - 5 hold - Seated Upper Trapezius Stretch  - 1 x daily - 7 x weekly - 2 sets - 2 reps - 30 hold  ASSESSMENT:  CLINICAL IMPRESSION:  Continued with current plan of care as laid out in evaluation and recent prior sessions.  Pt closely monitored throughout session pt response and to maximize patient safety during interventions. Pt reports improved tightness in his T spine and neck following session. Pt has taken steps to change his workplace posture, will await results from this.Pt will continue to benefit from skilled physical therapy intervention to address impairments, improve QOL, and attain therapy goals.     OBJECTIVE IMPAIRMENTS: Abnormal gait, decreased activity tolerance, decreased endurance, decreased mobility, decreased ROM, decreased strength, hypomobility, increased fascial restrictions, increased muscle spasms, impaired flexibility, impaired UE functional use, improper body mechanics, postural  dysfunction, and pain.   ACTIVITY LIMITATIONS: carrying, lifting, bending, sitting, standing, squatting, sleeping, bed mobility, reach over head, and locomotion level  PARTICIPATION LIMITATIONS: community activity, occupation, and yard work  PERSONAL FACTORS: Behavior pattern, Fitness, Past/current experiences, Profession, Time since onset of injury/illness/exacerbation, and 1-2 comorbidities: HLD and cervicalgia  are also affecting patient's functional outcome.   REHAB POTENTIAL: Good  CLINICAL DECISION MAKING: Stable/uncomplicated  EVALUATION COMPLEXITY: Low   GOALS: Goals reviewed with patient? Yes  SHORT TERM GOALS: Target date: 10/10/2022  Patient will be independent in home exercise program to improve   strength/mobility for better functional independence with ADLs. Baseline: To be given. Goal status: INITIAL  2. Patient will demonstrate sidelying <> EOB bed mobility without cervical flexion to demonstrate decrease cervical strain and improved functional mobility.  Baseline: rapid cervical flexion and sit up motion with holding of breath to sit up Goal status: INITIAL   LONG TERM GOALS: Target date: 11/07/2022   Patient will increase FOTO score to equal to or greater than ___ to demonstrate statistically significant improvement in mobility and quality of life.  Baseline: TBD Goal status: INITIAL  2.   Patient will report a worst pain of 3/10 on NPRS in headaches to improve tolerance with ADLs and reduced symptoms with activities.   Baseline: worst: 10/10 Goal  status: INITIAL  3.  Patient will report less < 2 / 5 on section 5 on NDI to indicate significant decrease in occurrence of headaches.  Baseline: 5/5 Goal status: INITIAL  4.  Patient will demonstrate upright posture in sitting and standing to decrease cervical musculature and upper trapezius resting tension.  Baseline: significant forward head posture, lumbar lordosis, unable to achieve thoracic extension to  neutral  Goal status: INITIAL  5.  Patient will achieve full scapular range of motion for increased functional mobility to reach overhead with decrease compensatory muscle activation.  Baseline: no upward rotation past 90 degrees of shoulder abduction  Goal status: INITIAL     PLAN:  PT FREQUENCY: 1-2x/week  PT DURATION: 8 weeks  PLANNED INTERVENTIONS: Therapeutic exercises, Therapeutic activity, Neuromuscular re-education, Balance training, Gait training, Patient/Family education, Self Care, Joint mobilization, Joint manipulation, Vestibular training, Canalith repositioning, Orthotic/Fit training, Dry Needling, Electrical stimulation, Spinal manipulation, Spinal mobilization, Cryotherapy, Moist heat, scar mobilization, Splintting, Taping, Traction, Biofeedback, Manual therapy, and Re-evaluation  PLAN FOR NEXT SESSION: FOTO For cervical (not lumbar), manual therapy to B posterior / lateral ribcage and thoracic paraspinals, dry needling, postural re-ed & strengthening of thoracic extensors and cervical flexors     Norman Herrlich, PT 10/04/2022, 8:01 AM

## 2022-10-08 ENCOUNTER — Ambulatory Visit: Payer: BC Managed Care – PPO

## 2022-10-08 DIAGNOSIS — R293 Abnormal posture: Secondary | ICD-10-CM | POA: Diagnosis not present

## 2022-10-08 DIAGNOSIS — G8929 Other chronic pain: Secondary | ICD-10-CM | POA: Diagnosis not present

## 2022-10-08 DIAGNOSIS — M542 Cervicalgia: Secondary | ICD-10-CM | POA: Diagnosis not present

## 2022-10-08 DIAGNOSIS — M25511 Pain in right shoulder: Secondary | ICD-10-CM | POA: Diagnosis not present

## 2022-10-08 DIAGNOSIS — M546 Pain in thoracic spine: Secondary | ICD-10-CM | POA: Diagnosis not present

## 2022-10-09 NOTE — Therapy (Signed)
OUTPATIENT PHYSICAL THERAPY CERVICAL TREATMENT   Patient Name: Jeremiah Carpenter MRN: [id number] DOB:07-Dec-1965, 57 y.o., male Today's Date: 10/10/2022  END OF SESSION:  PT End of Session - 10/10/22 0712     Visit Number 9    Number of Visits 16    Date for PT Re-Evaluation 11/07/22    Progress Note Due on Visit 10    PT Start Time 0713    PT Stop Time 0758    PT Time Calculation (min) 45 min    Activity Tolerance Patient tolerated treatment well    Behavior During Therapy [facility] for tasks assessed/performed                     Past Medical History:  Diagnosis Date   History of colon polyps    benign   History of kidney stones    History of migraine    last one about a month ago   Hyperlipidemia    takes Atorvastatin daily   Joint pain    Weakness    numbness mainly left hand and rarely on right   Past Surgical History:  Procedure Laterality Date   ANTERIOR CERVICAL DECOMP/DISCECTOMY FUSION N/A 01/11/2016   Procedure: C5-6, C6-7 Anterior Cervical Discectomy and Fusion, Allograft, Plate;  Surgeon: Eldred Manges, MD;  Location: [facility] OR;  Service: Orthopedics;  Laterality: N/A;   CARPAL TUNNEL RELEASE Bilateral    COLONOSCOPY     COLONOSCOPY WITH PROPOFOL N/A 01/25/2020   Procedure: COLONOSCOPY WITH PROPOFOL;  Surgeon: Wyline Mood, MD;  Location: [facility] ENDOSCOPY;  Service: Gastroenterology;  Laterality: N/A;   VASECTOMY     Patient Active Problem List   Diagnosis Date Noted   Right facial numbness 09/26/2022   Injury of face 09/26/2022   History of seborrheic keratosis 08/28/2022   Cervical spondylosis 09/06/2021   Anterolisthesis of cervical spine (C3-C4) 03/30/2021   Cervicalgia 03/07/2021   Chronic pain syndrome 03/07/2021   Tension headache 07/11/2020   S/P cervical spinal fusion 11/13/2017   Cervical stenosis of spine 01/11/2016   Hx of adenomatous polyp of colon 05/19/2014   Low testosterone 05/06/2012   HYPERCHOLESTEROLEMIA 06/21/2009    PCP:  Mort Sawyers, FNP  REFERRING PROVIDER: Mort Sawyers, FNP  REFERRING DIAG:   M43.12 (ICD-10-CM) - Anterolisthesis of cervical spine M47.812 (ICD-10-CM) - Cervical spondylosis M54.12 (ICD-10-CM) - Cervical radicular pain    THERAPY DIAG:  Cervicalgia  Abnormal posture  Pain in thoracic spine  Rationale for Evaluation and Treatment: Rehabilitation  ONSET DATE: ongoing > 6 months   SUBJECTIVE:  SUBJECTIVE STATEMENT:  Patient reports more pain on his right side today. Is getting better.   Hand dominance: Right  PERTINENT HISTORY:  Pt is a 57 y.o. M with a PMH of HLD (medicated), joint pain, tension headaches, cervicalgia, and numbness of L hand. Pt has a surgical history of ACDF at two levels, C5-6, C6-7 in 2017, as well as B carpal tunnel release surgery. Pt has been a pt at [facility] pain clinic and PT clinic for chronic pain, and is requesting [name], DPT for dry needling.   PAIN:  Are you having pain? Yes: NPRS scale: 3/10 Pain location: head Pain description: dull, but progresses to severe migraine Aggravating factors: prolonged sitting, increased mobility  Relieving factors: lying down, standing up  Headache in the back of the head that is always there, and infrequently pain down into arm.  HA: worst 10/10, currently 3/10 (typical), best: 3/10  PRECAUTIONS: None  RED FLAGS: None     WEIGHT BEARING RESTRICTIONS: No  FALLS:  Has patient fallen in last 6 months? No  LIVING ENVIRONMENT: Lives with: lives with their family Lives in: House/apartment Stairs: Yes: Internal: 14 steps; can reach both and External: 3 steps; on right going up Has following equipment at home: None  OCCUPATION: Art gallery manager for [employer]  PLOF: Independent  PATIENT GOALS: "I want to get HA  down to a 1/10, and never have to have surgery again"  NEXT MD VISIT: n/a  OBJECTIVE:   PATIENT SURVEYS:  NDI 11/50 FOTO TBD  COGNITION: Overall cognitive status: Within functional limits for tasks assessed  SENSATION: WFL  POSTURE: rounded shoulders, forward head, increased lumbar lordosis, increased thoracic kyphosis, and flexed trunk   PALPATION: B SCM tension and pain R > L B cervical paraspinal tension and pain R > L  B thoracic paraspinal tension and pain B intercostal tension and pain    CERVICAL ROM:   Active ROM A/PROM (deg) eval  Flexion 35  Extension 30  Right lateral flexion 35  Left lateral flexion 30  Right rotation 35  Left rotation 25   (Blank rows = not tested)  UPPER EXTREMITY ROM:  Not formally assessed with goniometer, but appeared [facility] for all GHJ mobility.    UPPER EXTREMITY MMT:  MMT Right eval Left eval  Shoulder flexion 5 5  Shoulder extension 5 5  Shoulder abduction 5 5  Shoulder adduction 5 5  Shoulder internal rotation    Shoulder external rotation    Middle trapezius    Lower trapezius 4- 4-  Elbow flexion    Elbow extension     (Blank rows = not tested)    ACCESSORY MOTIONS:  B Rib mobility: hypomobile and painful  C2-T2 PA's: hypomobile with no springing, muscle guarding  Scapular mobility: hypomobile, minimal scapular rotation with > 90 deg shoulder abduction, scapulae adherence to ribcage    FUNCTIONAL TESTS:  N/A  TODAY'S TREATMENT:  DATE: 10/10/22   Manual:  Cervical side bend with overpressure 2x30 seconds each side J mobilizations 3x30 seconds Suboccipital release 4x30 seconds  Grade II mobilizations cervical to mid thoracic spine UPA and CPA.  Scapular retraction and depression RUE 3x30 seconds R GH AP mobilization grade II 10x 5 seconds R cross body adduction with distraction 3x    TherEx: Standing RTB overhead Y 15x  Abduction against wall 15x Row with cable: 10x ; #22.5; 2 sets semitandem stance Upper trap stretch 3x30 seconds L stretch with scapular protraction/retraction at top x 5   Trigger Point Dry Needling (TDN), unbilled Education performed with patient regarding potential benefit of TDN. Reviewed precautions and risks with patient. Reviewed special precautions/risks over lung fields which include pneumothorax. Reviewed signs and symptoms of pneumothorax and advised pt to go to ER immediately if these symptoms develop advise them of dry needling treatment. Extensive time spent with pt to ensure full understanding of TDN risks. Pt provided verbal consent to treatment. TDN performed to  with 0.25 x 40 single needle placements with local twitch response (LTR). Pistoning technique utilized. Improved pain-free motion following intervention. Bilateral upper trap, x3 minutes    PATIENT EDUCATION:  Education details: POC, purpose of PT, HEP, goals  Person educated: Patient Education method: Explanation, Demonstration, and Tactile cues Education comprehension: verbalized understanding, returned demonstration, tactile cues required, and needs further education  HOME EXERCISE PROGRAM: Formal HEP to be prescribed;  -bed mobility re: supine <> sidelying with bridge and elbows into bed with no cervical flexion / sidelying <> EOB with no cervical flexion   Access Code: [email or web address] URL: [email or web address] Date: 09/17/2022 Prepared by: [name]  Exercises - Seated Scapular Retraction  - 1 x daily - 7 x weekly - 2 sets - 10 reps - 5 hold - Supine Chin Tuck  - 1 x daily - 7 x weekly - 2 sets - 10 reps - 5 hold - Seated Upper Trapezius Stretch  - 1 x daily - 7 x weekly - 2 sets - 2 reps - 30 hold  ASSESSMENT:  CLINICAL IMPRESSION:  Patient tolerates progressive postural stabilization training on wall. He does have increased right upper  trap/scapula elevation and protraction noted at beginning of session that is improved by end of session.  .Pt will continue to benefit from skilled physical therapy intervention to address impairments, improve QOL, and attain therapy goals.     OBJECTIVE IMPAIRMENTS: Abnormal gait, decreased activity tolerance, decreased endurance, decreased mobility, decreased ROM, decreased strength, hypomobility, increased fascial restrictions, increased muscle spasms, impaired flexibility, impaired UE functional use, improper body mechanics, postural dysfunction, and pain.   ACTIVITY LIMITATIONS: carrying, lifting, bending, sitting, standing, squatting, sleeping, bed mobility, reach over head, and locomotion level  PARTICIPATION LIMITATIONS: community activity, occupation, and yard work  PERSONAL FACTORS: Behavior pattern, Fitness, Past/current experiences, Profession, Time since onset of injury/illness/exacerbation, and 1-2 comorbidities: HLD and cervicalgia  are also affecting patient's functional outcome.   REHAB POTENTIAL: Good  CLINICAL DECISION MAKING: Stable/uncomplicated  EVALUATION COMPLEXITY: Low   GOALS: Goals reviewed with patient? Yes  SHORT TERM GOALS: Target date: 10/10/2022  Patient will be independent in home exercise program to improve   strength/mobility for better functional independence with ADLs. Baseline: To be given. Goal status: INITIAL  2. Patient will demonstrate sidelying <> EOB bed mobility without cervical flexion to demonstrate decrease cervical strain and improved functional mobility.  Baseline: rapid cervical flexion and sit up motion with holding of breath to sit up Goal  status: INITIAL   LONG TERM GOALS: Target date: 11/07/2022   Patient will increase FOTO score to equal to or greater than ___ to demonstrate statistically significant improvement in mobility and quality of life.  Baseline: TBD Goal status: INITIAL  2.   Patient will report a worst pain of  3/10 on NPRS in headaches to improve tolerance with ADLs and reduced symptoms with activities.   Baseline: worst: 10/10 Goal status: INITIAL  3.  Patient will report less < 2 / 5 on section 5 on NDI to indicate significant decrease in occurrence of headaches.  Baseline: 5/5 Goal status: INITIAL  4.  Patient will demonstrate upright posture in sitting and standing to decrease cervical musculature and upper trapezius resting tension.  Baseline: significant forward head posture, lumbar lordosis, unable to achieve thoracic extension to neutral  Goal status: INITIAL  5.  Patient will achieve full scapular range of motion for increased functional mobility to reach overhead with decrease compensatory muscle activation.  Baseline: no upward rotation past 90 degrees of shoulder abduction  Goal status: INITIAL     PLAN:  PT FREQUENCY: 1-2x/week  PT DURATION: 8 weeks  PLANNED INTERVENTIONS: Therapeutic exercises, Therapeutic activity, Neuromuscular re-education, Balance training, Gait training, Patient/Family education, Self Care, Joint mobilization, Joint manipulation, Vestibular training, Canalith repositioning, Orthotic/Fit training, Dry Needling, Electrical stimulation, Spinal manipulation, Spinal mobilization, Cryotherapy, Moist heat, scar mobilization, Splintting, Taping, Traction, Biofeedback, Manual therapy, and Re-evaluation  PLAN FOR NEXT SESSION: FOTO For cervical (not lumbar), manual therapy to B posterior / lateral ribcage and thoracic paraspinals, dry needling, postural re-ed & strengthening of thoracic extensors and cervical flexors     [name], PT 10/10/2022, 7:58 AM

## 2022-10-10 ENCOUNTER — Ambulatory Visit: Payer: BC Managed Care – PPO

## 2022-10-10 DIAGNOSIS — M25511 Pain in right shoulder: Secondary | ICD-10-CM | POA: Diagnosis not present

## 2022-10-10 DIAGNOSIS — R293 Abnormal posture: Secondary | ICD-10-CM

## 2022-10-10 DIAGNOSIS — M542 Cervicalgia: Secondary | ICD-10-CM

## 2022-10-10 DIAGNOSIS — G8929 Other chronic pain: Secondary | ICD-10-CM | POA: Diagnosis not present

## 2022-10-10 DIAGNOSIS — M546 Pain in thoracic spine: Secondary | ICD-10-CM | POA: Diagnosis not present

## 2022-10-15 NOTE — Therapy (Signed)
OUTPATIENT PHYSICAL THERAPY CERVICAL TREATMENT/ Physical Therapy Progress Note   Dates of reporting period  09/12/22   to   10/16/22    Patient Name: Jeremiah Carpenter MRN: [id number] DOB:[birth year], 57 y.o., male Today's Date: 10/16/2022  END OF SESSION:  PT End of Session - 10/16/22 0707     Visit Number 10    Number of Visits 16    Date for PT Re-Evaluation 11/07/22    Progress Note Due on Visit 10    PT Start Time 0708    PT Stop Time 0753    PT Time Calculation (min) 45 min    Activity Tolerance Patient tolerated treatment well    Behavior During Therapy [facility] for tasks assessed/performed                      Past Medical History:  Diagnosis Date   History of colon polyps    benign   History of kidney stones    History of migraine    last one about a month ago   Hyperlipidemia    takes Atorvastatin daily   Joint pain    Weakness    numbness mainly left hand and rarely on right   Past Surgical History:  Procedure Laterality Date   ANTERIOR CERVICAL DECOMP/DISCECTOMY FUSION N/A 01/11/2016   Procedure: C5-6, C6-7 Anterior Cervical Discectomy and Fusion, Allograft, Plate;  Surgeon: Eldred Manges, MD;  Location: [facility] OR;  Service: Orthopedics;  Laterality: N/A;   CARPAL TUNNEL RELEASE Bilateral    COLONOSCOPY     COLONOSCOPY WITH PROPOFOL N/A 01/25/2020   Procedure: COLONOSCOPY WITH PROPOFOL;  Surgeon: Wyline Mood, MD;  Location: [facility] ENDOSCOPY;  Service: Gastroenterology;  Laterality: N/A;   VASECTOMY     Patient Active Problem List   Diagnosis Date Noted   Right facial numbness 09/26/2022   Injury of face 09/26/2022   History of seborrheic keratosis 08/28/2022   Cervical spondylosis 09/06/2021   Anterolisthesis of cervical spine (C3-C4) 03/30/2021   Cervicalgia 03/07/2021   Chronic pain syndrome 03/07/2021   Tension headache 07/11/2020   S/P cervical spinal fusion 11/13/2017   Cervical stenosis of spine 01/11/2016   Hx of adenomatous polyp of colon  05/19/2014   Low testosterone 05/06/2012   HYPERCHOLESTEROLEMIA 06/21/2009    PCP: Mort Sawyers, FNP  REFERRING PROVIDER: Mort Sawyers, FNP  REFERRING DIAG:   M43.12 (ICD-10-CM) - Anterolisthesis of cervical spine M47.812 (ICD-10-CM) - Cervical spondylosis M54.12 (ICD-10-CM) - Cervical radicular pain    THERAPY DIAG:  Cervicalgia  Abnormal posture  Pain in thoracic spine  Rationale for Evaluation and Treatment: Rehabilitation  ONSET DATE: ongoing > 6 months   SUBJECTIVE:  SUBJECTIVE STATEMENT:  Patient reports he feels 60% back to where he wants to be at. Wants to continue working on R shoulder and neck.   Hand dominance: Right  PERTINENT HISTORY:  Pt is a 57 y.o. M with a PMH of HLD (medicated), joint pain, tension headaches, cervicalgia, and numbness of L hand. Pt has a surgical history of ACDF at two levels, C5-6, C6-7 in 2017, as well as B carpal tunnel release surgery. Pt has been a pt at [facility] pain clinic and PT clinic for chronic pain, and is requesting [name], DPT for dry needling.   PAIN:  Are you having pain? Yes: NPRS scale: 3/10 Pain location: head Pain description: dull, but progresses to severe migraine Aggravating factors: prolonged sitting, increased mobility  Relieving factors: lying down, standing up  Headache in the back of the head that is always there, and infrequently pain down into arm.  HA: worst 10/10, currently 3/10 (typical), best: 3/10  PRECAUTIONS: None  RED FLAGS: None     WEIGHT BEARING RESTRICTIONS: No  FALLS:  Has patient fallen in last 6 months? No  LIVING ENVIRONMENT: Lives with: lives with their family Lives in: House/apartment Stairs: Yes: Internal: 14 steps; can reach both and External: 3 steps; on right going  up Has following equipment at home: None  OCCUPATION: Art gallery manager for [employer]  PLOF: Independent  PATIENT GOALS: "I want to get HA down to a 1/10, and never have to have surgery again"  NEXT MD VISIT: n/a  OBJECTIVE:   PATIENT SURVEYS:  NDI 11/50 FOTO TBD  COGNITION: Overall cognitive status: Within functional limits for tasks assessed  SENSATION: WFL  POSTURE: rounded shoulders, forward head, increased lumbar lordosis, increased thoracic kyphosis, and flexed trunk   PALPATION: B SCM tension and pain R > L B cervical paraspinal tension and pain R > L  B thoracic paraspinal tension and pain B intercostal tension and pain    CERVICAL ROM:   Active ROM A/PROM (deg) eval  Flexion 35  Extension 30  Right lateral flexion 35  Left lateral flexion 30  Right rotation 35  Left rotation 25   (Blank rows = not tested)  UPPER EXTREMITY ROM:  Not formally assessed with goniometer, but appeared [facility] for all GHJ mobility.    UPPER EXTREMITY MMT:  MMT Right eval Left eval  Shoulder flexion 5 5  Shoulder extension 5 5  Shoulder abduction 5 5  Shoulder adduction 5 5  Shoulder internal rotation    Shoulder external rotation    Middle trapezius    Lower trapezius 4- 4-  Elbow flexion    Elbow extension     (Blank rows = not tested)    ACCESSORY MOTIONS:  B Rib mobility: hypomobile and painful  C2-T2 PA's: hypomobile with no springing, muscle guarding  Scapular mobility: hypomobile, minimal scapular rotation with > 90 deg shoulder abduction, scapulae adherence to ribcage    FUNCTIONAL TESTS:  N/A  TODAY'S TREATMENT:  DATE: 10/16/22  Goals assessed: see below Manual:  Cervical side bend with overpressure 2x30 seconds each side J mobilizations 3x30 seconds Suboccipital release 4x30 seconds  Grade II mobilizations cervical to mid thoracic  spine UPA and CPA.    TherEx: Standing RTB overhead Y 15x  Abduction against wall 15x Row with cable: 10x ; #22.5; 2 sets semitandem stance Thoracic extension over half foam roller 10x Standing wall posture 10x 5 seconds Wall Y's 10x    Trigger Point Dry Needling (TDN), unbilled Education performed with patient regarding potential benefit of TDN. Reviewed precautions and risks with patient. Reviewed special precautions/risks over lung fields which include pneumothorax. Reviewed signs and symptoms of pneumothorax and advised pt to go to ER immediately if these symptoms develop advise them of dry needling treatment. Extensive time spent with pt to ensure full understanding of TDN risks. Pt provided verbal consent to treatment. TDN performed to  with 0.25 x 40 single needle placements with local twitch response (LTR). Pistoning technique utilized. Improved pain-free motion following intervention. Bilateral upper trap, x3 minutes    PATIENT EDUCATION:  Education details: POC, purpose of PT, HEP, goals  Person educated: Patient Education method: Explanation, Demonstration, and Tactile cues Education comprehension: verbalized understanding, returned demonstration, tactile cues required, and needs further education  HOME EXERCISE PROGRAM: Formal HEP to be prescribed;  -bed mobility re: supine <> sidelying with bridge and elbows into bed with no cervical flexion / sidelying <> EOB with no cervical flexion   Access Code: [email or web address] URL: [email or web address] Date: 09/17/2022 Prepared by: [name]  Exercises - Seated Scapular Retraction  - 1 x daily - 7 x weekly - 2 sets - 10 reps - 5 hold - Supine Chin Tuck  - 1 x daily - 7 x weekly - 2 sets - 10 reps - 5 hold - Seated Upper Trapezius Stretch  - 1 x daily - 7 x weekly - 2 sets - 2 reps - 30 hold  ASSESSMENT:  CLINICAL IMPRESSION: Patient has excellent motivation throughout session and excellent prognosis at this time.   Patient's goals are progressing with decreased pain and improved mobility. Patient's condition has the potential to improve in response to therapy. Maximum improvement is yet to be obtained. The anticipated improvement is attainable and reasonable in a generally predictable time.   .Pt will continue to benefit from skilled physical therapy intervention to address impairments, improve QOL, and attain therapy goals.     OBJECTIVE IMPAIRMENTS: Abnormal gait, decreased activity tolerance, decreased endurance, decreased mobility, decreased ROM, decreased strength, hypomobility, increased fascial restrictions, increased muscle spasms, impaired flexibility, impaired UE functional use, improper body mechanics, postural dysfunction, and pain.   ACTIVITY LIMITATIONS: carrying, lifting, bending, sitting, standing, squatting, sleeping, bed mobility, reach over head, and locomotion level  PARTICIPATION LIMITATIONS: community activity, occupation, and yard work  PERSONAL FACTORS: Behavior pattern, Fitness, Past/current experiences, Profession, Time since onset of injury/illness/exacerbation, and 1-2 comorbidities: HLD and cervicalgia  are also affecting patient's functional outcome.   REHAB POTENTIAL: Good  CLINICAL DECISION MAKING: Stable/uncomplicated  EVALUATION COMPLEXITY: Low   GOALS: Goals reviewed with patient? Yes  SHORT TERM GOALS: Target date: 10/10/2022  Patient will be independent in home exercise program to improve   strength/mobility for better functional independence with ADLs. Baseline: To be given. 8/20: intermittent compliance  Goal status:Partially Met  2. Patient will demonstrate sidelying <> EOB bed mobility without cervical flexion to demonstrate decrease cervical strain and improved functional mobility.  Baseline: rapid cervical flexion and sit  up motion with holding of breath to sit up 8/20: able to perform  Goal status: MET    LONG TERM GOALS: Target date:  11/07/2022   Patient will increase FOTO score to equal to or greater than 84 to demonstrate statistically significant improvement in mobility and quality of life.  Baseline: 8/20: 72  Goal status: Ongoing   2.   Patient will report a worst pain of 3/10 on NPRS in headaches to improve tolerance with ADLs and reduced symptoms with activities.   Baseline: worst: 10/10 8/20: 6-7/10  Goal status: Partially Met   3.  Patient will report less < 2 / 5 on section 5 on NDI to indicate significant decrease in occurrence of headaches.  Baseline: 5/5 8/20: 2/10  Goal status: MET  4.  Patient will demonstrate upright posture in sitting and standing to decrease cervical musculature and upper trapezius resting tension.  Baseline: significant forward head posture, lumbar lordosis, unable to achieve thoracic extension to neutral 8/20: forward head rounding shoulders but able to correct with cueing Goal status: Partially Met  5.  Patient will achieve full scapular range of motion for increased functional mobility to reach overhead with decrease compensatory muscle activation.  Baseline: no upward rotation past 90 degrees of shoulder abduction 8/20: slight improvement Goal status: Ongoing     PLAN:  PT FREQUENCY: 1-2x/week  PT DURATION: 8 weeks  PLANNED INTERVENTIONS: Therapeutic exercises, Therapeutic activity, Neuromuscular re-education, Balance training, Gait training, Patient/Family education, Self Care, Joint mobilization, Joint manipulation, Vestibular training, Canalith repositioning, Orthotic/Fit training, Dry Needling, Electrical stimulation, Spinal manipulation, Spinal mobilization, Cryotherapy, Moist heat, scar mobilization, Splintting, Taping, Traction, Biofeedback, Manual therapy, and Re-evaluation  PLAN FOR NEXT SESSION: FOTO For cervical (not lumbar), manual therapy to B posterior / lateral ribcage and thoracic paraspinals, dry needling, postural re-ed & strengthening of thoracic  extensors and cervical flexors     [name], PT 10/16/2022, 7:55 AM

## 2022-10-16 ENCOUNTER — Ambulatory Visit: Payer: BC Managed Care – PPO

## 2022-10-16 DIAGNOSIS — M542 Cervicalgia: Secondary | ICD-10-CM | POA: Diagnosis not present

## 2022-10-16 DIAGNOSIS — R293 Abnormal posture: Secondary | ICD-10-CM | POA: Diagnosis not present

## 2022-10-16 DIAGNOSIS — M546 Pain in thoracic spine: Secondary | ICD-10-CM | POA: Diagnosis not present

## 2022-10-16 DIAGNOSIS — G8929 Other chronic pain: Secondary | ICD-10-CM | POA: Diagnosis not present

## 2022-10-16 DIAGNOSIS — M25511 Pain in right shoulder: Secondary | ICD-10-CM | POA: Diagnosis not present

## 2022-10-17 NOTE — Therapy (Signed)
OUTPATIENT PHYSICAL THERAPY CERVICAL TREATMENT   Patient Name: Jeremiah Carpenter MRN: [id number] DOB:Jan 04, 1966, 56 y.o., male Today's Date: 10/18/2022  END OF SESSION:  PT End of Session - 10/18/22 0755     Visit Number 11    Number of Visits 16    Date for PT Re-Evaluation 11/07/22    Progress Note Due on Visit 10    PT Start Time 0700    PT Stop Time 0726    PT Time Calculation (min) 26 min    Activity Tolerance Patient tolerated treatment well    Behavior During Therapy [facility] for tasks assessed/performed                       Past Medical History:  Diagnosis Date   History of colon polyps    benign   History of kidney stones    History of migraine    last one about a month ago   Hyperlipidemia    takes Atorvastatin daily   Joint pain    Weakness    numbness mainly left hand and rarely on right   Past Surgical History:  Procedure Laterality Date   ANTERIOR CERVICAL DECOMP/DISCECTOMY FUSION N/A 01/11/2016   Procedure: C5-6, C6-7 Anterior Cervical Discectomy and Fusion, Allograft, Plate;  Surgeon: Eldred Manges, MD;  Location: [facility] OR;  Service: Orthopedics;  Laterality: N/A;   CARPAL TUNNEL RELEASE Bilateral    COLONOSCOPY     COLONOSCOPY WITH PROPOFOL N/A 01/25/2020   Procedure: COLONOSCOPY WITH PROPOFOL;  Surgeon: Wyline Mood, MD;  Location: [facility] ENDOSCOPY;  Service: Gastroenterology;  Laterality: N/A;   VASECTOMY     Patient Active Problem List   Diagnosis Date Noted   Right facial numbness 09/26/2022   Injury of face 09/26/2022   History of seborrheic keratosis 08/28/2022   Cervical spondylosis 09/06/2021   Anterolisthesis of cervical spine (C3-C4) 03/30/2021   Cervicalgia 03/07/2021   Chronic pain syndrome 03/07/2021   Tension headache 07/11/2020   S/P cervical spinal fusion 11/13/2017   Cervical stenosis of spine 01/11/2016   Hx of adenomatous polyp of colon 05/19/2014   Low testosterone 05/06/2012   HYPERCHOLESTEROLEMIA 06/21/2009     PCP: Mort Sawyers, FNP  REFERRING PROVIDER: Mort Sawyers, FNP  REFERRING DIAG:   M43.12 (ICD-10-CM) - Anterolisthesis of cervical spine M47.812 (ICD-10-CM) - Cervical spondylosis M54.12 (ICD-10-CM) - Cervical radicular pain    THERAPY DIAG:  Cervicalgia  Abnormal posture  Pain in thoracic spine  Rationale for Evaluation and Treatment: Rehabilitation  ONSET DATE: ongoing > 6 months   SUBJECTIVE:  SUBJECTIVE STATEMENT:  Patient started treatment earlier so he could leave on a work trip.   Hand dominance: Right  PERTINENT HISTORY:  Pt is a 57 y.o. M with a PMH of HLD (medicated), joint pain, tension headaches, cervicalgia, and numbness of L hand. Pt has a surgical history of ACDF at two levels, C5-6, C6-7 in 2017, as well as B carpal tunnel release surgery. Pt has been a pt at [facility] pain clinic and PT clinic for chronic pain, and is requesting [name], DPT for dry needling.   PAIN:  Are you having pain? Yes: NPRS scale: 3/10 Pain location: head Pain description: dull, but progresses to severe migraine Aggravating factors: prolonged sitting, increased mobility  Relieving factors: lying down, standing up  Headache in the back of the head that is always there, and infrequently pain down into arm.  HA: worst 10/10, currently 3/10 (typical), best: 3/10  PRECAUTIONS: None  RED FLAGS: None     WEIGHT BEARING RESTRICTIONS: No  FALLS:  Has patient fallen in last 6 months? No  LIVING ENVIRONMENT: Lives with: lives with their family Lives in: House/apartment Stairs: Yes: Internal: 14 steps; can reach both and External: 3 steps; on right going up Has following equipment at home: None  OCCUPATION: Art gallery manager for [employer]  PLOF: Independent  PATIENT GOALS: "I want  to get HA down to a 1/10, and never have to have surgery again"  NEXT MD VISIT: n/a  OBJECTIVE:   PATIENT SURVEYS:  NDI 11/50 FOTO TBD  COGNITION: Overall cognitive status: Within functional limits for tasks assessed  SENSATION: WFL  POSTURE: rounded shoulders, forward head, increased lumbar lordosis, increased thoracic kyphosis, and flexed trunk   PALPATION: B SCM tension and pain R > L B cervical paraspinal tension and pain R > L  B thoracic paraspinal tension and pain B intercostal tension and pain    CERVICAL ROM:   Active ROM A/PROM (deg) eval  Flexion 35  Extension 30  Right lateral flexion 35  Left lateral flexion 30  Right rotation 35  Left rotation 25   (Blank rows = not tested)  UPPER EXTREMITY ROM:  Not formally assessed with goniometer, but appeared [facility] for all GHJ mobility.    UPPER EXTREMITY MMT:  MMT Right eval Left eval  Shoulder flexion 5 5  Shoulder extension 5 5  Shoulder abduction 5 5  Shoulder adduction 5 5  Shoulder internal rotation    Shoulder external rotation    Middle trapezius    Lower trapezius 4- 4-  Elbow flexion    Elbow extension     (Blank rows = not tested)    ACCESSORY MOTIONS:  B Rib mobility: hypomobile and painful  C2-T2 PA's: hypomobile with no springing, muscle guarding  Scapular mobility: hypomobile, minimal scapular rotation with > 90 deg shoulder abduction, scapulae adherence to ribcage    FUNCTIONAL TESTS:  N/A  TODAY'S TREATMENT:  DATE: 10/18/22  Manual:  Cervical side bend with overpressure 2x30 seconds each side J mobilizations 3x30 seconds Suboccipital release 4x30 seconds  Grade II mobilizations cervical to mid thoracic spine UPA and CPA.    TherEx: Standing RTB overhead Y 15x     Trigger Point Dry Needling (TDN), unbilled Education performed with patient  regarding potential benefit of TDN. Reviewed precautions and risks with patient. Reviewed special precautions/risks over lung fields which include pneumothorax. Reviewed signs and symptoms of pneumothorax and advised pt to go to ER immediately if these symptoms develop advise them of dry needling treatment. Extensive time spent with pt to ensure full understanding of TDN risks. Pt provided verbal consent to treatment. TDN performed to  with 0.25 x 40 single needle placements with local twitch response (LTR). Pistoning technique utilized. Improved pain-free motion following intervention. Bilateral upper trap, x3 minutes    PATIENT EDUCATION:  Education details: POC, purpose of PT, HEP, goals  Person educated: Patient Education method: Explanation, Demonstration, and Tactile cues Education comprehension: verbalized understanding, returned demonstration, tactile cues required, and needs further education  HOME EXERCISE PROGRAM: Formal HEP to be prescribed;  -bed mobility re: supine <> sidelying with bridge and elbows into bed with no cervical flexion / sidelying <> EOB with no cervical flexion   Access Code: [email or web address] URL: [email or web address] Date: 09/17/2022 Prepared by: [name]  Exercises - Seated Scapular Retraction  - 1 x daily - 7 x weekly - 2 sets - 10 reps - 5 hold - Supine Chin Tuck  - 1 x daily - 7 x weekly - 2 sets - 10 reps - 5 hold - Seated Upper Trapezius Stretch  - 1 x daily - 7 x weekly - 2 sets - 2 reps - 30 hold  ASSESSMENT:  CLINICAL IMPRESSION: Patient has significant trigger points of bilateral upper traps this session. Cavitations of thoracic spine noted with patient reporting relief by end of session. Suboccipital release assisted in decreasing headache.   .Pt will continue to benefit from skilled physical therapy intervention to address impairments, improve QOL, and attain therapy goals.     OBJECTIVE IMPAIRMENTS: Abnormal gait, decreased  activity tolerance, decreased endurance, decreased mobility, decreased ROM, decreased strength, hypomobility, increased fascial restrictions, increased muscle spasms, impaired flexibility, impaired UE functional use, improper body mechanics, postural dysfunction, and pain.   ACTIVITY LIMITATIONS: carrying, lifting, bending, sitting, standing, squatting, sleeping, bed mobility, reach over head, and locomotion level  PARTICIPATION LIMITATIONS: community activity, occupation, and yard work  PERSONAL FACTORS: Behavior pattern, Fitness, Past/current experiences, Profession, Time since onset of injury/illness/exacerbation, and 1-2 comorbidities: HLD and cervicalgia  are also affecting patient's functional outcome.   REHAB POTENTIAL: Good  CLINICAL DECISION MAKING: Stable/uncomplicated  EVALUATION COMPLEXITY: Low   GOALS: Goals reviewed with patient? Yes  SHORT TERM GOALS: Target date: 10/10/2022  Patient will be independent in home exercise program to improve   strength/mobility for better functional independence with ADLs. Baseline: To be given. 8/20: intermittent compliance  Goal status:Partially Met  2. Patient will demonstrate sidelying <> EOB bed mobility without cervical flexion to demonstrate decrease cervical strain and improved functional mobility.  Baseline: rapid cervical flexion and sit up motion with holding of breath to sit up 8/20: able to perform  Goal status: MET    LONG TERM GOALS: Target date: 11/07/2022   Patient will increase FOTO score to equal to or greater than 84 to demonstrate statistically significant improvement in mobility and quality of life.  Baseline: 8/20: 72  Goal  status: Ongoing   2.   Patient will report a worst pain of 3/10 on NPRS in headaches to improve tolerance with ADLs and reduced symptoms with activities.   Baseline: worst: 10/10 8/20: 6-7/10  Goal status: Partially Met   3.  Patient will report less < 2 / 5 on section 5 on NDI to indicate  significant decrease in occurrence of headaches.  Baseline: 5/5 8/20: 2/10  Goal status: MET  4.  Patient will demonstrate upright posture in sitting and standing to decrease cervical musculature and upper trapezius resting tension.  Baseline: significant forward head posture, lumbar lordosis, unable to achieve thoracic extension to neutral 8/20: forward head rounding shoulders but able to correct with cueing Goal status: Partially Met  5.  Patient will achieve full scapular range of motion for increased functional mobility to reach overhead with decrease compensatory muscle activation.  Baseline: no upward rotation past 90 degrees of shoulder abduction 8/20: slight improvement Goal status: Ongoing     PLAN:  PT FREQUENCY: 1-2x/week  PT DURATION: 8 weeks  PLANNED INTERVENTIONS: Therapeutic exercises, Therapeutic activity, Neuromuscular re-education, Balance training, Gait training, Patient/Family education, Self Care, Joint mobilization, Joint manipulation, Vestibular training, Canalith repositioning, Orthotic/Fit training, Dry Needling, Electrical stimulation, Spinal manipulation, Spinal mobilization, Cryotherapy, Moist heat, scar mobilization, Splintting, Taping, Traction, Biofeedback, Manual therapy, and Re-evaluation  PLAN FOR NEXT SESSION: FOTO For cervical (not lumbar), manual therapy to B posterior / lateral ribcage and thoracic paraspinals, dry needling, postural re-ed & strengthening of thoracic extensors and cervical flexors     [name], PT 10/18/2022, 7:58 AM

## 2022-10-18 ENCOUNTER — Ambulatory Visit: Payer: BC Managed Care – PPO

## 2022-10-18 DIAGNOSIS — M546 Pain in thoracic spine: Secondary | ICD-10-CM

## 2022-10-18 DIAGNOSIS — R293 Abnormal posture: Secondary | ICD-10-CM | POA: Diagnosis not present

## 2022-10-18 DIAGNOSIS — M25511 Pain in right shoulder: Secondary | ICD-10-CM | POA: Diagnosis not present

## 2022-10-18 DIAGNOSIS — M542 Cervicalgia: Secondary | ICD-10-CM

## 2022-10-18 DIAGNOSIS — G8929 Other chronic pain: Secondary | ICD-10-CM | POA: Diagnosis not present

## 2022-10-18 NOTE — Therapy (Signed)
OUTPATIENT PHYSICAL THERAPY CERVICAL TREATMENT   Patient Name: Jeremiah Carpenter MRN: [id number] DOB:April 26, 1965, 57 y.o., male Today's Date: 10/22/2022  END OF SESSION:  PT End of Session - 10/22/22 0711     Visit Number 12    Number of Visits 16    Date for PT Re-Evaluation 11/07/22    Progress Note Due on Visit 10    PT Start Time 0712    PT Stop Time 0758    PT Time Calculation (min) 46 min    Activity Tolerance Patient tolerated treatment well    Behavior During Therapy [facility] for tasks assessed/performed                        Past Medical History:  Diagnosis Date   History of colon polyps    benign   History of kidney stones    History of migraine    last one about a month ago   Hyperlipidemia    takes Atorvastatin daily   Joint pain    Weakness    numbness mainly left hand and rarely on right   Past Surgical History:  Procedure Laterality Date   ANTERIOR CERVICAL DECOMP/DISCECTOMY FUSION N/A 01/11/2016   Procedure: C5-6, C6-7 Anterior Cervical Discectomy and Fusion, Allograft, Plate;  Surgeon: Eldred Manges, MD;  Location: [facility] OR;  Service: Orthopedics;  Laterality: N/A;   CARPAL TUNNEL RELEASE Bilateral    COLONOSCOPY     COLONOSCOPY WITH PROPOFOL N/A 01/25/2020   Procedure: COLONOSCOPY WITH PROPOFOL;  Surgeon: Wyline Mood, MD;  Location: [facility] ENDOSCOPY;  Service: Gastroenterology;  Laterality: N/A;   VASECTOMY     Patient Active Problem List   Diagnosis Date Noted   Right facial numbness 09/26/2022   Injury of face 09/26/2022   History of seborrheic keratosis 08/28/2022   Cervical spondylosis 09/06/2021   Anterolisthesis of cervical spine (C3-C4) 03/30/2021   Cervicalgia 03/07/2021   Chronic pain syndrome 03/07/2021   Tension headache 07/11/2020   S/P cervical spinal fusion 11/13/2017   Cervical stenosis of spine 01/11/2016   Hx of adenomatous polyp of colon 05/19/2014   Low testosterone 05/06/2012   HYPERCHOLESTEROLEMIA 06/21/2009     PCP: Jeremiah Sawyers, FNP  REFERRING PROVIDER: Mort Sawyers, FNP  REFERRING DIAG:   M43.12 (ICD-10-CM) - Anterolisthesis of cervical spine M47.812 (ICD-10-CM) - Cervical spondylosis M54.12 (ICD-10-CM) - Cervical radicular pain    THERAPY DIAG:  Cervicalgia  Abnormal posture  Pain in thoracic spine  Rationale for Evaluation and Treatment: Rehabilitation  ONSET DATE: ongoing > 6 months   SUBJECTIVE:  SUBJECTIVE STATEMENT: Patient reports he had 8 hours of driving after lats session. Has been compliant with HEP.   Hand dominance: Right  PERTINENT HISTORY:  Pt is a 57 y.o. M with a PMH of HLD (medicated), joint pain, tension headaches, cervicalgia, and numbness of L hand. Pt has a surgical history of ACDF at two levels, C5-6, C6-7 in 2017, as well as B carpal tunnel release surgery. Pt has been a pt at [facility] pain clinic and PT clinic for chronic pain, and is requesting [name], DPT for dry needling.   PAIN:  Are you having pain? Yes: NPRS scale: 3/10 Pain location: head Pain description: dull, but progresses to severe migraine Aggravating factors: prolonged sitting, increased mobility  Relieving factors: lying down, standing up  Headache in the back of the head that is always there, and infrequently pain down into arm.  HA: worst 10/10, currently 3/10 (typical), best: 3/10  PRECAUTIONS: None  RED FLAGS: None     WEIGHT BEARING RESTRICTIONS: No  FALLS:  Has patient fallen in last 6 months? No  LIVING ENVIRONMENT: Lives with: lives with their family Lives in: House/apartment Stairs: Yes: Internal: 14 steps; can reach both and External: 3 steps; on right going up Has following equipment at home: None  OCCUPATION: Art gallery manager for [employer]  PLOF:  Independent  PATIENT GOALS: "I want to get HA down to a 1/10, and never have to have surgery again"  NEXT MD VISIT: n/a  OBJECTIVE:   PATIENT SURVEYS:  NDI 11/50 FOTO TBD  COGNITION: Overall cognitive status: Within functional limits for tasks assessed  SENSATION: WFL  POSTURE: rounded shoulders, forward head, increased lumbar lordosis, increased thoracic kyphosis, and flexed trunk   PALPATION: B SCM tension and pain R > L B cervical paraspinal tension and pain R > L  B thoracic paraspinal tension and pain B intercostal tension and pain    CERVICAL ROM:   Active ROM A/PROM (deg) eval  Flexion 35  Extension 30  Right lateral flexion 35  Left lateral flexion 30  Right rotation 35  Left rotation 25   (Blank rows = not tested)  UPPER EXTREMITY ROM:  Not formally assessed with goniometer, but appeared [facility] for all GHJ mobility.    UPPER EXTREMITY MMT:  MMT Right eval Left eval  Shoulder flexion 5 5  Shoulder extension 5 5  Shoulder abduction 5 5  Shoulder adduction 5 5  Shoulder internal rotation    Shoulder external rotation    Middle trapezius    Lower trapezius 4- 4-  Elbow flexion    Elbow extension     (Blank rows = not tested)    ACCESSORY MOTIONS:  B Rib mobility: hypomobile and painful  C2-T2 PA's: hypomobile with no springing, muscle guarding  Scapular mobility: hypomobile, minimal scapular rotation with > 90 deg shoulder abduction, scapulae adherence to ribcage    FUNCTIONAL TESTS:  N/A  TODAY'S TREATMENT:  DATE: 10/22/22  Manual:  Cervical side bend with overpressure 2x30 seconds each side J mobilizations 3x30 seconds Suboccipital release 4x30 seconds  Grade II mobilizations cervical to mid thoracic spine UPA and CPA.    TherEx: Wall angel wings 10x Wall lower trap raises 10x Swiss ball: I's , Y's, W's  with 2lb weights 5lb DB bent over row 10x Swiss ball TrA  activation with UE raises 10x each side Towel IR stretch 10x  Swiss ball trunk rollout 10x   Trigger Point Dry Needling (TDN), unbilled Education performed with patient regarding potential benefit of TDN. Reviewed precautions and risks with patient. Reviewed special precautions/risks over lung fields which include pneumothorax. Reviewed signs and symptoms of pneumothorax and advised pt to go to ER immediately if these symptoms develop advise them of dry needling treatment. Extensive time spent with pt to ensure full understanding of TDN risks. Pt provided verbal consent to treatment. TDN performed to  with 0.25 x 40 single needle placements with local twitch response (LTR). Pistoning technique utilized. Improved pain-free motion following intervention. Bilateral upper trap, x3 minutes    PATIENT EDUCATION:  Education details: POC, purpose of PT, HEP, goals  Person educated: Patient Education method: Explanation, Demonstration, and Tactile cues Education comprehension: verbalized understanding, returned demonstration, tactile cues required, and needs further education  HOME EXERCISE PROGRAM: Formal HEP to be prescribed;  -bed mobility re: supine <> sidelying with bridge and elbows into bed with no cervical flexion / sidelying <> EOB with no cervical flexion   Access Code: [email or web address] URL: [email or web address] Date: 09/17/2022 Prepared by: [name]  Exercises - Seated Scapular Retraction  - 1 x daily - 7 x weekly - 2 sets - 10 reps - 5 hold - Supine Chin Tuck  - 1 x daily - 7 x weekly - 2 sets - 10 reps - 5 hold - Seated Upper Trapezius Stretch  - 1 x daily - 7 x weekly - 2 sets - 2 reps - 30 hold  Access Code: URL: [email or web address] Date: 10/22/2022 Prepared by: [name]  Exercises - Prone Shoulder W on [employer]  - 1 x daily - 7 x weekly - 2 sets - 10 reps - 5 hold - Prone  Lower Trapezius Strengthening on Swiss Ball  - 1 x daily - 7 x weekly - 2 sets - 10 reps - 5 hold - Prone Shoulder Flexion on Swiss Ball  - 1 x daily - 7 x weekly - 2 sets - 10 reps - 5 hold ASSESSMENT:  CLINICAL IMPRESSION: Patient presents to PT with excellent motivation Patient's postural strengthening interventions increased in complexity, adding 2lb weights for home. He is able to tolerate all interventions without pain. Tactile cues for reduction of scapular elevation is required however. Pt will continue to benefit from skilled physical therapy intervention to address impairments, improve QOL, and attain therapy goals.     OBJECTIVE IMPAIRMENTS: Abnormal gait, decreased activity tolerance, decreased endurance, decreased mobility, decreased ROM, decreased strength, hypomobility, increased fascial restrictions, increased muscle spasms, impaired flexibility, impaired UE functional use, improper body mechanics, postural dysfunction, and pain.   ACTIVITY LIMITATIONS: carrying, lifting, bending, sitting, standing, squatting, sleeping, bed mobility, reach over head, and locomotion level  PARTICIPATION LIMITATIONS: community activity, occupation, and yard work  PERSONAL FACTORS: Behavior pattern, Fitness, Past/current experiences, Profession, Time since onset of injury/illness/exacerbation, and 1-2 comorbidities: HLD and cervicalgia  are also affecting patient's functional outcome.   REHAB POTENTIAL: Good  CLINICAL DECISION MAKING: Stable/uncomplicated  EVALUATION COMPLEXITY:  Low   GOALS: Goals reviewed with patient? Yes  SHORT TERM GOALS: Target date: 10/10/2022  Patient will be independent in home exercise program to improve   strength/mobility for better functional independence with ADLs. Baseline: To be given. 8/20: intermittent compliance  Goal status:Partially Met  2. Patient will demonstrate sidelying <> EOB bed mobility without cervical flexion to demonstrate decrease cervical  strain and improved functional mobility.  Baseline: rapid cervical flexion and sit up motion with holding of breath to sit up 8/20: able to perform  Goal status: MET    LONG TERM GOALS: Target date: 11/07/2022   Patient will increase FOTO score to equal to or greater than 84 to demonstrate statistically significant improvement in mobility and quality of life.  Baseline: 8/20: 72  Goal status: Ongoing   2.   Patient will report a worst pain of 3/10 on NPRS in headaches to improve tolerance with ADLs and reduced symptoms with activities.   Baseline: worst: 10/10 8/20: 6-7/10  Goal status: Partially Met   3.  Patient will report less < 2 / 5 on section 5 on NDI to indicate significant decrease in occurrence of headaches.  Baseline: 5/5 8/20: 2/10  Goal status: MET  4.  Patient will demonstrate upright posture in sitting and standing to decrease cervical musculature and upper trapezius resting tension.  Baseline: significant forward head posture, lumbar lordosis, unable to achieve thoracic extension to neutral 8/20: forward head rounding shoulders but able to correct with cueing Goal status: Partially Met  5.  Patient will achieve full scapular range of motion for increased functional mobility to reach overhead with decrease compensatory muscle activation.  Baseline: no upward rotation past 90 degrees of shoulder abduction 8/20: slight improvement Goal status: Ongoing     PLAN:  PT FREQUENCY: 1-2x/week  PT DURATION: 8 weeks  PLANNED INTERVENTIONS: Therapeutic exercises, Therapeutic activity, Neuromuscular re-education, Balance training, Gait training, Patient/Family education, Self Care, Joint mobilization, Joint manipulation, Vestibular training, Canalith repositioning, Orthotic/Fit training, Dry Needling, Electrical stimulation, Spinal manipulation, Spinal mobilization, Cryotherapy, Moist heat, scar mobilization, Splintting, Taping, Traction, Biofeedback, Manual therapy, and  Re-evaluation  PLAN FOR NEXT SESSION: FOTO For cervical (not lumbar), manual therapy to B posterior / lateral ribcage and thoracic paraspinals, dry needling, postural re-ed & strengthening of thoracic extensors and cervical flexors     [name], PT 10/22/2022, 7:58 AM

## 2022-10-22 ENCOUNTER — Ambulatory Visit: Payer: BC Managed Care – PPO

## 2022-10-22 DIAGNOSIS — M546 Pain in thoracic spine: Secondary | ICD-10-CM | POA: Diagnosis not present

## 2022-10-22 DIAGNOSIS — M542 Cervicalgia: Secondary | ICD-10-CM | POA: Diagnosis not present

## 2022-10-22 DIAGNOSIS — G8929 Other chronic pain: Secondary | ICD-10-CM | POA: Diagnosis not present

## 2022-10-22 DIAGNOSIS — M25511 Pain in right shoulder: Secondary | ICD-10-CM | POA: Diagnosis not present

## 2022-10-22 DIAGNOSIS — R293 Abnormal posture: Secondary | ICD-10-CM | POA: Diagnosis not present

## 2022-10-24 NOTE — Therapy (Signed)
OUTPATIENT PHYSICAL THERAPY CERVICAL TREATMENT   Patient Name: Jeremiah Carpenter MRN: [id number] DOB:1965-06-14, 57 y.o., male Today's Date: 10/25/2022  END OF SESSION:  PT End of Session - 10/25/22 0708     Visit Number 13    Number of Visits 16    Date for PT Re-Evaluation 11/07/22    Progress Note Due on Visit 10    PT Start Time 0709    PT Stop Time 0752    PT Time Calculation (min) 43 min    Activity Tolerance Patient tolerated treatment well    Behavior During Therapy [facility] for tasks assessed/performed                         Past Medical History:  Diagnosis Date   History of colon polyps    benign   History of kidney stones    History of migraine    last one about a month ago   Hyperlipidemia    takes Atorvastatin daily   Joint pain    Weakness    numbness mainly left hand and rarely on right   Past Surgical History:  Procedure Laterality Date   ANTERIOR CERVICAL DECOMP/DISCECTOMY FUSION N/A 01/11/2016   Procedure: C5-6, C6-7 Anterior Cervical Discectomy and Fusion, Allograft, Plate;  Surgeon: Eldred Manges, MD;  Location: [facility] OR;  Service: Orthopedics;  Laterality: N/A;   CARPAL TUNNEL RELEASE Bilateral    COLONOSCOPY     COLONOSCOPY WITH PROPOFOL N/A 01/25/2020   Procedure: COLONOSCOPY WITH PROPOFOL;  Surgeon: Wyline Mood, MD;  Location: [facility] ENDOSCOPY;  Service: Gastroenterology;  Laterality: N/A;   VASECTOMY     Patient Active Problem List   Diagnosis Date Noted   Right facial numbness 09/26/2022   Injury of face 09/26/2022   History of seborrheic keratosis 08/28/2022   Cervical spondylosis 09/06/2021   Anterolisthesis of cervical spine (C3-C4) 03/30/2021   Cervicalgia 03/07/2021   Chronic pain syndrome 03/07/2021   Tension headache 07/11/2020   S/P cervical spinal fusion 11/13/2017   Cervical stenosis of spine 01/11/2016   Hx of adenomatous polyp of colon 05/19/2014   Low testosterone 05/06/2012   HYPERCHOLESTEROLEMIA 06/21/2009     PCP: Mort Sawyers, FNP  REFERRING PROVIDER: Mort Sawyers, FNP  REFERRING DIAG:   M43.12 (ICD-10-CM) - Anterolisthesis of cervical spine M47.812 (ICD-10-CM) - Cervical spondylosis M54.12 (ICD-10-CM) - Cervical radicular pain    THERAPY DIAG:  Cervicalgia  Abnormal posture  Pain in thoracic spine  Rationale for Evaluation and Treatment: Rehabilitation  ONSET DATE: ongoing > 6 months   SUBJECTIVE:  SUBJECTIVE STATEMENT: Patient reports stress at work, no new pains.   Hand dominance: Right  PERTINENT HISTORY:  Pt is a 57 y.o. M with a PMH of HLD (medicated), joint pain, tension headaches, cervicalgia, and numbness of L hand. Pt has a surgical history of ACDF at two levels, C5-6, C6-7 in 2017, as well as B carpal tunnel release surgery. Pt has been a pt at [facility] pain clinic and PT clinic for chronic pain, and is requesting [name], DPT for dry needling.   PAIN:  Are you having pain? Yes: NPRS scale: 3/10 Pain location: head Pain description: dull, but progresses to severe migraine Aggravating factors: prolonged sitting, increased mobility  Relieving factors: lying down, standing up  Headache in the back of the head that is always there, and infrequently pain down into arm.  HA: worst 10/10, currently 3/10 (typical), best: 3/10  PRECAUTIONS: None  RED FLAGS: None     WEIGHT BEARING RESTRICTIONS: No  FALLS:  Has patient fallen in last 6 months? No  LIVING ENVIRONMENT: Lives with: lives with their family Lives in: House/apartment Stairs: Yes: Internal: 14 steps; can reach both and External: 3 steps; on right going up Has following equipment at home: None  OCCUPATION: Art gallery manager for [employer]  PLOF: Independent  PATIENT GOALS: "I want to get HA down to a 1/10,  and never have to have surgery again"  NEXT MD VISIT: n/a  OBJECTIVE:   PATIENT SURVEYS:  NDI 11/50 FOTO TBD  COGNITION: Overall cognitive status: Within functional limits for tasks assessed  SENSATION: WFL  POSTURE: rounded shoulders, forward head, increased lumbar lordosis, increased thoracic kyphosis, and flexed trunk   PALPATION: B SCM tension and pain R > L B cervical paraspinal tension and pain R > L  B thoracic paraspinal tension and pain B intercostal tension and pain    CERVICAL ROM:   Active ROM A/PROM (deg) eval  Flexion 35  Extension 30  Right lateral flexion 35  Left lateral flexion 30  Right rotation 35  Left rotation 25   (Blank rows = not tested)  UPPER EXTREMITY ROM:  Not formally assessed with goniometer, but appeared [facility] for all GHJ mobility.    UPPER EXTREMITY MMT:  MMT Right eval Left eval  Shoulder flexion 5 5  Shoulder extension 5 5  Shoulder abduction 5 5  Shoulder adduction 5 5  Shoulder internal rotation    Shoulder external rotation    Middle trapezius    Lower trapezius 4- 4-  Elbow flexion    Elbow extension     (Blank rows = not tested)    ACCESSORY MOTIONS:  B Rib mobility: hypomobile and painful  C2-T2 PA's: hypomobile with no springing, muscle guarding  Scapular mobility: hypomobile, minimal scapular rotation with > 90 deg shoulder abduction, scapulae adherence to ribcage    FUNCTIONAL TESTS:  N/A  TODAY'S TREATMENT:  DATE: 10/25/22  Manual:  Cervical side bend with overpressure 2x30 seconds each side J mobilizations 3x30 seconds Suboccipital release 4x30 seconds  Grade II mobilizations cervical to mid thoracic spine UPA and CPA.    TherEx: Overhead Y 15x GTB ER 15x Scapular retraction with cervical rotation 12x each side  Wall angel wings 10x Wall lower trap raises 10x GTB row  20x    Trigger Point Dry Needling (TDN), unbilled Education performed with patient regarding potential benefit of TDN. Reviewed precautions and risks with patient. Reviewed special precautions/risks over lung fields which include pneumothorax. Reviewed signs and symptoms of pneumothorax and advised pt to go to ER immediately if these symptoms develop advise them of dry needling treatment. Extensive time spent with pt to ensure full understanding of TDN risks. Pt provided verbal consent to treatment. TDN performed to  with 0.25 x 40 single needle placements with local twitch response (LTR). Pistoning technique utilized. Improved pain-free motion following intervention. Bilateral upper trap, x2 minutes    PATIENT EDUCATION:  Education details: POC, purpose of PT, HEP, goals  Person educated: Patient Education method: Explanation, Demonstration, and Tactile cues Education comprehension: verbalized understanding, returned demonstration, tactile cues required, and needs further education  HOME EXERCISE PROGRAM: Formal HEP to be prescribed;  -bed mobility re: supine <> sidelying with bridge and elbows into bed with no cervical flexion / sidelying <> EOB with no cervical flexion   Access Code: [email or web address] URL: [email or web address] Date: 09/17/2022 Prepared by: [name]  Exercises - Seated Scapular Retraction  - 1 x daily - 7 x weekly - 2 sets - 10 reps - 5 hold - Supine Chin Tuck  - 1 x daily - 7 x weekly - 2 sets - 10 reps - 5 hold - Seated Upper Trapezius Stretch  - 1 x daily - 7 x weekly - 2 sets - 2 reps - 30 hold  Access Code: URL: [email or web address] Date: 10/22/2022 Prepared by: [name]  Exercises - Prone Shoulder W on [employer]  - 1 x daily - 7 x weekly - 2 sets - 10 reps - 5 hold - Prone Lower Trapezius Strengthening on Swiss Ball  - 1 x daily - 7 x weekly - 2 sets - 10 reps - 5 hold - Prone Shoulder Flexion on Swiss Ball  - 1 x  daily - 7 x weekly - 2 sets - 10 reps - 5 hold ASSESSMENT:  CLINICAL IMPRESSION:  Patient is highly motivated throughout session. He has significant trigger point release in bilateral upper trap. Postural stabilization tolerated well.  Pt will continue to benefit from skilled physical therapy intervention to address impairments, improve QOL, and attain therapy goals.     OBJECTIVE IMPAIRMENTS: Abnormal gait, decreased activity tolerance, decreased endurance, decreased mobility, decreased ROM, decreased strength, hypomobility, increased fascial restrictions, increased muscle spasms, impaired flexibility, impaired UE functional use, improper body mechanics, postural dysfunction, and pain.   ACTIVITY LIMITATIONS: carrying, lifting, bending, sitting, standing, squatting, sleeping, bed mobility, reach over head, and locomotion level  PARTICIPATION LIMITATIONS: community activity, occupation, and yard work  PERSONAL FACTORS: Behavior pattern, Fitness, Past/current experiences, Profession, Time since onset of injury/illness/exacerbation, and 1-2 comorbidities: HLD and cervicalgia  are also affecting patient's functional outcome.   REHAB POTENTIAL: Good  CLINICAL DECISION MAKING: Stable/uncomplicated  EVALUATION COMPLEXITY: Low   GOALS: Goals reviewed with patient? Yes  SHORT TERM GOALS: Target date: 10/10/2022  Patient will be independent in home exercise program to improve   strength/mobility for better functional  independence with ADLs. Baseline: To be given. 8/20: intermittent compliance  Goal status:Partially Met  2. Patient will demonstrate sidelying <> EOB bed mobility without cervical flexion to demonstrate decrease cervical strain and improved functional mobility.  Baseline: rapid cervical flexion and sit up motion with holding of breath to sit up 8/20: able to perform  Goal status: MET    LONG TERM GOALS: Target date: 11/07/2022   Patient will increase FOTO score to equal to  or greater than 84 to demonstrate statistically significant improvement in mobility and quality of life.  Baseline: 8/20: 72  Goal status: Ongoing   2.   Patient will report a worst pain of 3/10 on NPRS in headaches to improve tolerance with ADLs and reduced symptoms with activities.   Baseline: worst: 10/10 8/20: 6-7/10  Goal status: Partially Met   3.  Patient will report less < 2 / 5 on section 5 on NDI to indicate significant decrease in occurrence of headaches.  Baseline: 5/5 8/20: 2/10  Goal status: MET  4.  Patient will demonstrate upright posture in sitting and standing to decrease cervical musculature and upper trapezius resting tension.  Baseline: significant forward head posture, lumbar lordosis, unable to achieve thoracic extension to neutral 8/20: forward head rounding shoulders but able to correct with cueing Goal status: Partially Met  5.  Patient will achieve full scapular range of motion for increased functional mobility to reach overhead with decrease compensatory muscle activation.  Baseline: no upward rotation past 90 degrees of shoulder abduction 8/20: slight improvement Goal status: Ongoing     PLAN:  PT FREQUENCY: 1-2x/week  PT DURATION: 8 weeks  PLANNED INTERVENTIONS: Therapeutic exercises, Therapeutic activity, Neuromuscular re-education, Balance training, Gait training, Patient/Family education, Self Care, Joint mobilization, Joint manipulation, Vestibular training, Canalith repositioning, Orthotic/Fit training, Dry Needling, Electrical stimulation, Spinal manipulation, Spinal mobilization, Cryotherapy, Moist heat, scar mobilization, Splintting, Taping, Traction, Biofeedback, Manual therapy, and Re-evaluation  PLAN FOR NEXT SESSION: FOTO For cervical (not lumbar), manual therapy to B posterior / lateral ribcage and thoracic paraspinals, dry needling, postural re-ed & strengthening of thoracic extensors and cervical flexors     [name], PT 10/25/2022,  7:56 AM

## 2022-10-25 ENCOUNTER — Ambulatory Visit: Payer: BC Managed Care – PPO

## 2022-10-25 DIAGNOSIS — G8929 Other chronic pain: Secondary | ICD-10-CM | POA: Diagnosis not present

## 2022-10-25 DIAGNOSIS — R293 Abnormal posture: Secondary | ICD-10-CM

## 2022-10-25 DIAGNOSIS — M542 Cervicalgia: Secondary | ICD-10-CM | POA: Diagnosis not present

## 2022-10-25 DIAGNOSIS — M546 Pain in thoracic spine: Secondary | ICD-10-CM

## 2022-10-25 DIAGNOSIS — M25511 Pain in right shoulder: Secondary | ICD-10-CM | POA: Diagnosis not present

## 2022-10-25 NOTE — Therapy (Signed)
OUTPATIENT PHYSICAL THERAPY CERVICAL TREATMENT   Patient Name: Jeremiah Carpenter MRN: [id number] DOB:[birth year], 57 y.o., male Today's Date: 10/30/2022  END OF SESSION:  PT End of Session - 10/30/22 0708     Visit Number 14    Number of Visits 16    Date for PT Re-Evaluation 11/07/22    Progress Note Due on Visit 10    PT Start Time 0709    PT Stop Time 0752    PT Time Calculation (min) 43 min    Activity Tolerance Patient tolerated treatment well    Behavior During Therapy [facility] for tasks assessed/performed                          Past Medical History:  Diagnosis Date   History of colon polyps    benign   History of kidney stones    History of migraine    last one about a month ago   Hyperlipidemia    takes Atorvastatin daily   Joint pain    Weakness    numbness mainly left hand and rarely on right   Past Surgical History:  Procedure Laterality Date   ANTERIOR CERVICAL DECOMP/DISCECTOMY FUSION N/A 01/11/2016   Procedure: C5-6, C6-7 Anterior Cervical Discectomy and Fusion, Allograft, Plate;  Surgeon: Eldred Manges, MD;  Location: [facility] OR;  Service: Orthopedics;  Laterality: N/A;   CARPAL TUNNEL RELEASE Bilateral    COLONOSCOPY     COLONOSCOPY WITH PROPOFOL N/A 01/25/2020   Procedure: COLONOSCOPY WITH PROPOFOL;  Surgeon: Wyline Mood, MD;  Location: [facility] ENDOSCOPY;  Service: Gastroenterology;  Laterality: N/A;   VASECTOMY     Patient Active Problem List   Diagnosis Date Noted   Right facial numbness 09/26/2022   Injury of face 09/26/2022   History of seborrheic keratosis 08/28/2022   Cervical spondylosis 09/06/2021   Anterolisthesis of cervical spine (C3-C4) 03/30/2021   Cervicalgia 03/07/2021   Chronic pain syndrome 03/07/2021   Tension headache 07/11/2020   S/P cervical spinal fusion 11/13/2017   Cervical stenosis of spine 01/11/2016   Hx of adenomatous polyp of colon 05/19/2014   Low testosterone 05/06/2012   HYPERCHOLESTEROLEMIA 06/21/2009     PCP: Mort Sawyers, FNP  REFERRING PROVIDER: Mort Sawyers, FNP  REFERRING DIAG:   M43.12 (ICD-10-CM) - Anterolisthesis of cervical spine M47.812 (ICD-10-CM) - Cervical spondylosis M54.12 (ICD-10-CM) - Cervical radicular pain    THERAPY DIAG:  Cervicalgia  Abnormal posture  Pain in thoracic spine  Rationale for Evaluation and Treatment: Rehabilitation  ONSET DATE: ongoing > 6 months   SUBJECTIVE:  SUBJECTIVE STATEMENT: Patient is highly motivated. Was working under his house over the weekend, feels tight  Hand dominance: Right  PERTINENT HISTORY:  Pt is a 57 y.o. M with a PMH of HLD (medicated), joint pain, tension headaches, cervicalgia, and numbness of L hand. Pt has a surgical history of ACDF at two levels, C5-6, C6-7 in 2017, as well as B carpal tunnel release surgery. Pt has been a pt at [facility] pain clinic and PT clinic for chronic pain, and is requesting Jeremiah Carpenter, DPT for dry needling.   PAIN:  Are you having pain? Yes: NPRS scale: 3/10 Pain location: head Pain description: dull, but progresses to severe migraine Aggravating factors: prolonged sitting, increased mobility  Relieving factors: lying down, standing up  Headache in the back of the head that is always there, and infrequently pain down into arm.  HA: worst 10/10, currently 3/10 (typical), best: 3/10  PRECAUTIONS: None  RED FLAGS: None     WEIGHT BEARING RESTRICTIONS: No  FALLS:  Has patient fallen in last 6 months? No  LIVING ENVIRONMENT: Lives with: lives with their family Lives in: House/apartment Stairs: Yes: Internal: 14 steps; can reach both and External: 3 steps; on right going up Has following equipment at home: None  OCCUPATION: Art gallery manager for [employer]  PLOF: Independent  PATIENT  GOALS: "I want to get HA down to a 1/10, and never have to have surgery again"  NEXT MD VISIT: n/a  OBJECTIVE:   PATIENT SURVEYS:  NDI 11/50 FOTO TBD  COGNITION: Overall cognitive status: Within functional limits for tasks assessed  SENSATION: WFL  POSTURE: rounded shoulders, forward head, increased lumbar lordosis, increased thoracic kyphosis, and flexed trunk   PALPATION: B SCM tension and pain R > L B cervical paraspinal tension and pain R > L  B thoracic paraspinal tension and pain B intercostal tension and pain    CERVICAL ROM:   Active ROM A/PROM (deg) eval  Flexion 35  Extension 30  Right lateral flexion 35  Left lateral flexion 30  Right rotation 35  Left rotation 25   (Blank rows = not tested)  UPPER EXTREMITY ROM:  Not formally assessed with goniometer, but appeared [facility] for all GHJ mobility.    UPPER EXTREMITY MMT:  MMT Right eval Left eval  Shoulder flexion 5 5  Shoulder extension 5 5  Shoulder abduction 5 5  Shoulder adduction 5 5  Shoulder internal rotation    Shoulder external rotation    Middle trapezius    Lower trapezius 4- 4-  Elbow flexion    Elbow extension     (Blank rows = not tested)    ACCESSORY MOTIONS:  B Rib mobility: hypomobile and painful  C2-T2 PA's: hypomobile with no springing, muscle guarding  Scapular mobility: hypomobile, minimal scapular rotation with > 90 deg shoulder abduction, scapulae adherence to ribcage    FUNCTIONAL TESTS:  N/A  TODAY'S TREATMENT:  DATE: 10/30/22  Manual:  Cervical side bend with overpressure 2x30 seconds each side J mobilizations 3x30 seconds Suboccipital release 4x30 seconds  Grade II mobilizations cervical to mid thoracic spine UPA and CPA.    TherEx: 22.5 matrix cable row with two handles 12x; 2 sets 22.5 matrix rope bent over row 12; x2 sets  Wall  scapular pushup 10x Wall angel wings 10x Wall lower trap raises 10x GTB straight arm lat pulldown 20x    Trigger Point Dry Needling (TDN), unbilled Education performed with patient regarding potential benefit of TDN. Reviewed precautions and risks with patient. Reviewed special precautions/risks over lung fields which include pneumothorax. Reviewed signs and symptoms of pneumothorax and advised pt to go to ER immediately if these symptoms develop advise them of dry needling treatment. Extensive time spent with pt to ensure full understanding of TDN risks. Pt provided verbal consent to treatment. TDN performed to  with 0.25 x 40 single needle placements with local twitch response (LTR). Pistoning technique utilized. Improved pain-free motion following intervention. Bilateral upper trap,cervical paraspinals x4 minutes    PATIENT EDUCATION:  Education details: POC, purpose of PT, HEP, goals  Person educated: Patient Education method: Explanation, Demonstration, and Tactile cues Education comprehension: verbalized understanding, returned demonstration, tactile cues required, and needs further education  HOME EXERCISE PROGRAM: Formal HEP to be prescribed;  -bed mobility re: supine <> sidelying with bridge and elbows into bed with no cervical flexion / sidelying <> EOB with no cervical flexion   Access Code: [email or web address] URL: [email or web address] Date: 09/17/2022 Prepared by: Jeremiah Carpenter  Exercises - Seated Scapular Retraction  - 1 x daily - 7 x weekly - 2 sets - 10 reps - 5 hold - Supine Chin Tuck  - 1 x daily - 7 x weekly - 2 sets - 10 reps - 5 hold - Seated Upper Trapezius Stretch  - 1 x daily - 7 x weekly - 2 sets - 2 reps - 30 hold  Access Code: URL: [email or web address] Date: 10/22/2022 Prepared by: Jeremiah Carpenter  Exercises - Prone Shoulder W on [employer]  - 1 x daily - 7 x weekly - 2 sets - 10 reps - 5 hold - Prone Lower Trapezius  Strengthening on Swiss Ball  - 1 x daily - 7 x weekly - 2 sets - 10 reps - 5 hold - Prone Shoulder Flexion on Swiss Ball  - 1 x daily - 7 x weekly - 2 sets - 10 reps - 5 hold ASSESSMENT:  CLINICAL IMPRESSION: Patient tolerates progressive postural strengthening throughout session. He does require tactile cueing for scapular pushups due to difficulty with scapular control. Continued focus on scapular strengthening and coordination will beneficial.  Pt will continue to benefit from skilled physical therapy intervention to address impairments, improve QOL, and attain therapy goals.     OBJECTIVE IMPAIRMENTS: Abnormal gait, decreased activity tolerance, decreased endurance, decreased mobility, decreased ROM, decreased strength, hypomobility, increased fascial restrictions, increased muscle spasms, impaired flexibility, impaired UE functional use, improper body mechanics, postural dysfunction, and pain.   ACTIVITY LIMITATIONS: carrying, lifting, bending, sitting, standing, squatting, sleeping, bed mobility, reach over head, and locomotion level  PARTICIPATION LIMITATIONS: community activity, occupation, and yard work  PERSONAL FACTORS: Behavior pattern, Fitness, Past/current experiences, Profession, Time since onset of injury/illness/exacerbation, and 1-2 comorbidities: HLD and cervicalgia  are also affecting patient's functional outcome.   REHAB POTENTIAL: Good  CLINICAL DECISION MAKING: Stable/uncomplicated  EVALUATION COMPLEXITY: Low   GOALS: Goals reviewed with patient? Yes  SHORT  TERM GOALS: Target date: 10/10/2022  Patient will be independent in home exercise program to improve   strength/mobility for better functional independence with ADLs. Baseline: To be given. 8/20: intermittent compliance  Goal status:Partially Met  2. Patient will demonstrate sidelying <> EOB bed mobility without cervical flexion to demonstrate decrease cervical strain and improved functional mobility.   Baseline: rapid cervical flexion and sit up motion with holding of breath to sit up 8/20: able to perform  Goal status: MET    LONG TERM GOALS: Target date: 11/07/2022   Patient will increase FOTO score to equal to or greater than 84 to demonstrate statistically significant improvement in mobility and quality of life.  Baseline: 8/20: 72  Goal status: Ongoing   2.   Patient will report a worst pain of 3/10 on NPRS in headaches to improve tolerance with ADLs and reduced symptoms with activities.   Baseline: worst: 10/10 8/20: 6-7/10  Goal status: Partially Met   3.  Patient will report less < 2 / 5 on section 5 on NDI to indicate significant decrease in occurrence of headaches.  Baseline: 5/5 8/20: 2/10  Goal status: MET  4.  Patient will demonstrate upright posture in sitting and standing to decrease cervical musculature and upper trapezius resting tension.  Baseline: significant forward head posture, lumbar lordosis, unable to achieve thoracic extension to neutral 8/20: forward head rounding shoulders but able to correct with cueing Goal status: Partially Met  5.  Patient will achieve full scapular range of motion for increased functional mobility to reach overhead with decrease compensatory muscle activation.  Baseline: no upward rotation past 90 degrees of shoulder abduction 8/20: slight improvement Goal status: Ongoing     PLAN:  PT FREQUENCY: 1-2x/week  PT DURATION: 8 weeks  PLANNED INTERVENTIONS: Therapeutic exercises, Therapeutic activity, Neuromuscular re-education, Balance training, Gait training, Patient/Family education, Self Care, Joint mobilization, Joint manipulation, Vestibular training, Canalith repositioning, Orthotic/Fit training, Dry Needling, Electrical stimulation, Spinal manipulation, Spinal mobilization, Cryotherapy, Moist heat, scar mobilization, Splintting, Taping, Traction, Biofeedback, Manual therapy, and Re-evaluation  PLAN FOR NEXT SESSION: FOTO For  cervical (not lumbar), manual therapy to B posterior / lateral ribcage and thoracic paraspinals, dry needling, postural re-ed & strengthening of thoracic extensors and cervical flexors     Jeremiah Carpenter, PT 10/30/2022, 7:58 AM

## 2022-10-30 ENCOUNTER — Ambulatory Visit: Payer: BC Managed Care – PPO | Attending: Family

## 2022-10-30 DIAGNOSIS — M25511 Pain in right shoulder: Secondary | ICD-10-CM | POA: Insufficient documentation

## 2022-10-30 DIAGNOSIS — R293 Abnormal posture: Secondary | ICD-10-CM

## 2022-10-30 DIAGNOSIS — M546 Pain in thoracic spine: Secondary | ICD-10-CM | POA: Diagnosis not present

## 2022-10-30 DIAGNOSIS — M542 Cervicalgia: Secondary | ICD-10-CM | POA: Diagnosis not present

## 2022-10-30 DIAGNOSIS — G8929 Other chronic pain: Secondary | ICD-10-CM | POA: Insufficient documentation

## 2022-11-01 ENCOUNTER — Ambulatory Visit: Payer: BC Managed Care – PPO

## 2022-11-01 DIAGNOSIS — M546 Pain in thoracic spine: Secondary | ICD-10-CM | POA: Diagnosis not present

## 2022-11-01 DIAGNOSIS — G8929 Other chronic pain: Secondary | ICD-10-CM | POA: Diagnosis not present

## 2022-11-01 DIAGNOSIS — R293 Abnormal posture: Secondary | ICD-10-CM | POA: Diagnosis not present

## 2022-11-01 DIAGNOSIS — M542 Cervicalgia: Secondary | ICD-10-CM | POA: Diagnosis not present

## 2022-11-01 DIAGNOSIS — M25511 Pain in right shoulder: Secondary | ICD-10-CM | POA: Diagnosis not present

## 2022-11-01 NOTE — Therapy (Signed)
OUTPATIENT PHYSICAL THERAPY TREATMENT   Patient Name: Jeremiah Carpenter MRN: [id number] DOB:1965-06-24, 57 y.o., male Today's Date: 11/01/2022  END OF SESSION:  PT End of Session - 11/01/22 0742     Visit Number 15    Number of Visits 16    Date for PT Re-Evaluation 11/07/22    Authorization Type BCBS    Progress Note Due on Visit 10    PT Start Time 0715    PT Stop Time 0755    PT Time Calculation (min) 40 min    Activity Tolerance Patient tolerated treatment well               Past Medical History:  Diagnosis Date   History of colon polyps    benign   History of kidney stones    History of migraine    last one about a month ago   Hyperlipidemia    takes Atorvastatin daily   Joint pain    Weakness    numbness mainly left hand and rarely on right   Past Surgical History:  Procedure Laterality Date   ANTERIOR CERVICAL DECOMP/DISCECTOMY FUSION N/A 01/11/2016   Procedure: C5-6, C6-7 Anterior Cervical Discectomy and Fusion, Allograft, Plate;  Surgeon: Eldred Manges, MD;  Location: [facility] OR;  Service: Orthopedics;  Laterality: N/A;   CARPAL TUNNEL RELEASE Bilateral    COLONOSCOPY     COLONOSCOPY WITH PROPOFOL N/A 01/25/2020   Procedure: COLONOSCOPY WITH PROPOFOL;  Surgeon: Wyline Mood, MD;  Location: [facility] ENDOSCOPY;  Service: Gastroenterology;  Laterality: N/A;   VASECTOMY     Patient Active Problem List   Diagnosis Date Noted   Right facial numbness 09/26/2022   Injury of face 09/26/2022   History of seborrheic keratosis 08/28/2022   Cervical spondylosis 09/06/2021   Anterolisthesis of cervical spine (C3-C4) 03/30/2021   Cervicalgia 03/07/2021   Chronic pain syndrome 03/07/2021   Tension headache 07/11/2020   S/P cervical spinal fusion 11/13/2017   Cervical stenosis of spine 01/11/2016   Hx of adenomatous polyp of colon 05/19/2014   Low testosterone 05/06/2012   HYPERCHOLESTEROLEMIA 06/21/2009    PCP: Mort Sawyers, FNP  REFERRING PROVIDER: Mort Sawyers, FNP  REFERRING DIAG:   M43.12 (ICD-10-CM) - Anterolisthesis of cervical spine M47.812 (ICD-10-CM) - Cervical spondylosis M54.12 (ICD-10-CM) - Cervical radicular pain    THERAPY DIAG:  Cervicalgia  Abnormal posture  Pain in thoracic spine  Chronic right shoulder pain  Rationale for Evaluation and Treatment: Rehabilitation  ONSET DATE: ongoing > 6 months   SUBJECTIVE:  SUBJECTIVE STATEMENT: Pt doing well. Had a migraine flare yesterday. Lots of work stress recently and long hours.  Hand dominance: Right  PERTINENT HISTORY:  Pt is a 57 y.o. M with a PMH of HLD (medicated), joint pain, tension headaches, cervicalgia, and numbness of L hand. Pt has a surgical history of ACDF at two levels, C5-6, C6-7 in 2017, as well as B carpal tunnel release surgery. Pt has been a pt at [facility] pain clinic and PT clinic for chronic pain, and is requesting [name], DPT for dry needling.   PAIN:  Are you having pain? Yes 3/10 rt neck, Rt mid belly upper trap area   PRECAUTIONS: None  RED FLAGS: None     WEIGHT BEARING RESTRICTIONS: No  FALLS:  Has patient fallen in last 6 months? No  LIVING ENVIRONMENT: Lives with: lives with their family Lives in: House/apartment Stairs: Yes: Internal: 14 steps; can reach both and External: 3 steps; on right going up Has following equipment at home: None  OCCUPATION: Art gallery manager for [employer]  PLOF: Independent  PATIENT GOALS: "I want to get HA down to a 1/10, and never have to have surgery again"  NEXT MD VISIT: n/a  OBJECTIVE:    TODAY'S TREATMENT:                                                                                                                              DATE: 11/01/22  -Rt upper trap dry needling in prone, levator scap  (2 minutes 39 seconds)  -MFR and PIN+stretch Rt upper trap x8 minutes, Rt pec minor and scapular depression stretch  -kneeling prayer stretch 2x40sec  -Cable row 2x12 2 22.5lb  -straight bar 'dislocates' 2x10x5secH, cues for scap depression, thoracic extension.     PATIENT EDUCATION:  Education details: Charity fundraiser and current restrictions   HOME EXERCISE PROGRAM: Formal HEP to be prescribed;  -bed mobility re: supine <> sidelying with bridge and elbows into bed with no cervical flexion / sidelying <> EOB with no cervical flexion   Access Code: [email or web address] URL: [email or web address] Date: 09/17/2022 Prepared by: [name]  Exercises - Seated Scapular Retraction  - 1 x daily - 7 x weekly - 2 sets - 10 reps - 5 hold - Supine Chin Tuck  - 1 x daily - 7 x weekly - 2 sets - 10 reps - 5 hold - Seated Upper Trapezius Stretch  - 1 x daily - 7 x weekly - 2 sets - 2 reps - 30 hold  Access Code: URL: [email or web address] Date: 10/22/2022 Prepared by: [name]  Exercises - Prone Shoulder W on [employer]  - 1 x daily - 7 x weekly - 2 sets - 10 reps - 5 hold - Prone Lower Trapezius Strengthening on Swiss Ball  - 1 x daily - 7 x weekly - 2 sets - 10 reps - 5 hold - Prone Shoulder Flexion on Swiss Ball  - 1 x daily -  7 x weekly - 2 sets - 10 reps - 5 hold  ASSESSMENT:  CLINICAL IMPRESSION: Pt arrives after flared migraine, Rt upper trap in spasm, quite responsive to dry needling and near-complete relaxation after 3-4 minutes sutained manual stretch thereafter. Also noted pec minor restrictions of scapula. Continued to progress interventions as tolerates. Continued focus on scapular strengthening and coordination will beneficial.  Pt will continue to benefit from skilled physical therapy intervention to address impairments, improve QOL, and attain therapy goals.     OBJECTIVE IMPAIRMENTS: Abnormal gait, decreased activity tolerance, decreased  endurance, decreased mobility, decreased ROM, decreased strength, hypomobility, increased fascial restrictions, increased muscle spasms, impaired flexibility, impaired UE functional use, improper body mechanics, postural dysfunction, and pain.   ACTIVITY LIMITATIONS: carrying, lifting, bending, sitting, standing, squatting, sleeping, bed mobility, reach over head, and locomotion level  PARTICIPATION LIMITATIONS: community activity, occupation, and yard work  PERSONAL FACTORS: Behavior pattern, Fitness, Past/current experiences, Profession, Time since onset of injury/illness/exacerbation, and 1-2 comorbidities: HLD and cervicalgia  are also affecting patient's functional outcome.   REHAB POTENTIAL: Good  CLINICAL DECISION MAKING: Stable/uncomplicated  EVALUATION COMPLEXITY: Low   GOALS: Goals reviewed with patient? Yes  SHORT TERM GOALS: Target date: 10/10/2022  Patient will be independent in home exercise program to improve   strength/mobility for better functional independence with ADLs. Baseline: To be given. 8/20: intermittent compliance  Goal status:Partially Met  2. Patient will demonstrate sidelying <> EOB bed mobility without cervical flexion to demonstrate decrease cervical strain and improved functional mobility.  Baseline: rapid cervical flexion and sit up motion with holding of breath to sit up 8/20: able to perform  Goal status: MET    LONG TERM GOALS: Target date: 11/07/2022   Patient will increase FOTO score to equal to or greater than 84 to demonstrate statistically significant improvement in mobility and quality of life.  Baseline: 8/20: 72  Goal status: Ongoing   2.   Patient will report a worst pain of 3/10 on NPRS in headaches to improve tolerance with ADLs and reduced symptoms with activities.   Baseline: worst: 10/10 8/20: 6-7/10  Goal status: Partially Met   3.  Patient will report less < 2 / 5 on section 5 on NDI to indicate significant decrease in  occurrence of headaches.  Baseline: 5/5 8/20: 2/10  Goal status: MET  4.  Patient will demonstrate upright posture in sitting and standing to decrease cervical musculature and upper trapezius resting tension.  Baseline: significant forward head posture, lumbar lordosis, unable to achieve thoracic extension to neutral 8/20: forward head rounding shoulders but able to correct with cueing Goal status: Partially Met  5.  Patient will achieve full scapular range of motion for increased functional mobility to reach overhead with decrease compensatory muscle activation.  Baseline: no upward rotation past 90 degrees of shoulder abduction 8/20: slight improvement Goal status: Ongoing     PLAN:  PT FREQUENCY: 1-2x/week  PT DURATION: 8 weeks  PLANNED INTERVENTIONS: Therapeutic exercises, Therapeutic activity, Neuromuscular re-education, Balance training, Gait training, Patient/Family education, Self Care, Joint mobilization, Joint manipulation, Vestibular training, Canalith repositioning, Orthotic/Fit training, Dry Needling, Electrical stimulation, Spinal manipulation, Spinal mobilization, Cryotherapy, Moist heat, scar mobilization, Splintting, Taping, Traction, Biofeedback, Manual therapy, and Re-evaluation  PLAN FOR NEXT SESSION:   Moody Robben C, PT 11/01/2022, 7:48 AM   8:01 AM, 11/01/22 Rosamaria Lints, PT, DPT Physical Therapist - [place]  Outpatient Physical Therapy- [facility] [phone]

## 2022-11-06 ENCOUNTER — Ambulatory Visit: Payer: BC Managed Care – PPO

## 2022-11-06 DIAGNOSIS — M25511 Pain in right shoulder: Secondary | ICD-10-CM | POA: Diagnosis not present

## 2022-11-06 DIAGNOSIS — M546 Pain in thoracic spine: Secondary | ICD-10-CM | POA: Diagnosis not present

## 2022-11-06 DIAGNOSIS — G8929 Other chronic pain: Secondary | ICD-10-CM | POA: Diagnosis not present

## 2022-11-06 DIAGNOSIS — M542 Cervicalgia: Secondary | ICD-10-CM | POA: Diagnosis not present

## 2022-11-06 DIAGNOSIS — R293 Abnormal posture: Secondary | ICD-10-CM

## 2022-11-06 NOTE — Therapy (Signed)
OUTPATIENT PHYSICAL THERAPY TREATMENT   Patient Name: Jeremiah Carpenter MRN: [id number] DOB:02/26/1966, 57 y.o., male Today's Date: 11/06/2022  END OF SESSION:  PT End of Session - 11/06/22 0746     Visit Number 16    Number of Visits 16    Date for PT Re-Evaluation 11/07/22    Authorization Type BCBS    PT Start Time 0715    PT Stop Time 0800    PT Time Calculation (min) 45 min    Activity Tolerance Patient tolerated treatment well;No increased pain    Behavior During Therapy WFL for tasks assessed/performed               Past Medical History:  Diagnosis Date   History of colon polyps    benign   History of kidney stones    History of migraine    last one about a month ago   Hyperlipidemia    takes Atorvastatin daily   Joint pain    Weakness    numbness mainly left hand and rarely on right   Past Surgical History:  Procedure Laterality Date   ANTERIOR CERVICAL DECOMP/DISCECTOMY FUSION N/A 01/11/2016   Procedure: C5-6, C6-7 Anterior Cervical Discectomy and Fusion, Allograft, Plate;  Surgeon: Eldred Manges, MD;  Location: [facility] OR;  Service: Orthopedics;  Laterality: N/A;   CARPAL TUNNEL RELEASE Bilateral    COLONOSCOPY     COLONOSCOPY WITH PROPOFOL N/A 01/25/2020   Procedure: COLONOSCOPY WITH PROPOFOL;  Surgeon: Jeremiah Mood, MD;  Location: [facility] ENDOSCOPY;  Service: Gastroenterology;  Laterality: N/A;   VASECTOMY     Patient Active Problem List   Diagnosis Date Noted   Right facial numbness 09/26/2022   Injury of face 09/26/2022   History of seborrheic keratosis 08/28/2022   Cervical spondylosis 09/06/2021   Anterolisthesis of cervical spine (C3-C4) 03/30/2021   Cervicalgia 03/07/2021   Chronic pain syndrome 03/07/2021   Tension headache 07/11/2020   S/P cervical spinal fusion 11/13/2017   Cervical stenosis of spine 01/11/2016   Hx of adenomatous polyp of colon 05/19/2014   Low testosterone 05/06/2012   HYPERCHOLESTEROLEMIA 06/21/2009    PCP: Jeremiah Sawyers, FNP  REFERRING PROVIDER: Mort Sawyers, FNP  REFERRING DIAG:   M43.12 (ICD-10-CM) - Anterolisthesis of cervical spine M47.812 (ICD-10-CM) - Cervical spondylosis M54.12 (ICD-10-CM) - Cervical radicular pain    THERAPY DIAG:  Cervicalgia  Abnormal posture  Pain in thoracic spine  Rationale for Evaluation and Treatment: Rehabilitation  ONSET DATE: ongoing > 6 months   SUBJECTIVE:  SUBJECTIVE STATEMENT: Feeling pretty good today, ~3/10 in Rt scapula ot lower neck. No headache  PERTINENT HISTORY:  Pt is a 57 y.o. M with a PMH of HLD (medicated), joint pain, tension headaches, cervicalgia, and numbness of L hand. Pt has a surgical history of ACDF at two levels, C5-6, C6-7 in 2017, as well as B carpal tunnel release surgery. Pt has been a pt at [facility] pain clinic and PT clinic for chronic pain, and is requesting Jeremiah Carpenter, DPT for dry needling.   PAIN:  Are you having pain? Yes 3/10 rt neck, Rt mid belly upper trap area   PRECAUTIONS: None  RED FLAGS: None     WEIGHT BEARING RESTRICTIONS: No  FALLS:  Has patient fallen in last 6 months? No  LIVING ENVIRONMENT: Lives with: lives with their family Lives in: House/apartment Stairs: Yes: Internal: 14 steps; can reach both and External: 3 steps; on right going up Has following equipment at home: None  OCCUPATION: Art gallery manager for [employer]  PLOF: Independent  PATIENT GOALS: "I want to get HA down to a 1/10, and never have to have surgery again"  NEXT MD VISIT: n/a  OBJECTIVE:    TODAY'S TREATMENT:                                                                                                                              DATE: 11/06/22  -Rt upper trap dry needling in supine, levator scap in prone (3 minutes 39  seconds) 0.48mmx40mm, 0.21mmx40mm,   -Moist hot pack to neck -wand flexion on throacic towel roll -seated cable row 2x15 @ 27.5 (increased this date, Rt shoulder blade moving better) -standing lateral raises 2x15 @ 3lb each     PATIENT EDUCATION:  Education details: Charity fundraiser and current restrictions   HOME EXERCISE PROGRAM: Formal HEP to be prescribed;  -bed mobility re: supine <> sidelying with bridge and elbows into bed with no cervical flexion / sidelying <> EOB with no cervical flexion   Access Code: [email or web address] URL: [email or web address] Date: 09/17/2022 Prepared by: Jeremiah Carpenter  Exercises - Seated Scapular Retraction  - 1 x daily - 7 x weekly - 2 sets - 10 reps - 5 hold - Supine Chin Tuck  - 1 x daily - 7 x weekly - 2 sets - 10 reps - 5 hold - Seated Upper Trapezius Stretch  - 1 x daily - 7 x weekly - 2 sets - 2 reps - 30 hold  Access Code: URL: [email or web address] Date: 10/22/2022 Prepared by: Jeremiah Carpenter  Exercises - Prone Shoulder W on [employer]  - 1 x daily - 7 x weekly - 2 sets - 10 reps - 5 hold - Prone Lower Trapezius Strengthening on Swiss Ball  - 1 x daily - 7 x weekly - 2 sets - 10 reps - 5 hold - Prone Shoulder Flexion on Swiss Ball  - 1 x daily - 7 x weekly - 2 sets -  10 reps - 5 hold  ASSESSMENT:  CLINICAL IMPRESSION: Continued with levator therapies, manual and stretching, then transition to BUE loading. Good tolerance overall. Pt will continue to benefit from skilled physical therapy intervention to address impairments, improve QOL, and attain therapy goals.     OBJECTIVE IMPAIRMENTS: Abnormal gait, decreased activity tolerance, decreased endurance, decreased mobility, decreased ROM, decreased strength, hypomobility, increased fascial restrictions, increased muscle spasms, impaired flexibility, impaired UE functional use, improper body mechanics, postural dysfunction, and pain.   ACTIVITY LIMITATIONS:  carrying, lifting, bending, sitting, standing, squatting, sleeping, bed mobility, reach over head, and locomotion level  PARTICIPATION LIMITATIONS: community activity, occupation, and yard work  PERSONAL FACTORS: Behavior pattern, Fitness, Past/current experiences, Profession, Time since onset of injury/illness/exacerbation, and 1-2 comorbidities: HLD and cervicalgia  are also affecting patient's functional outcome.   REHAB POTENTIAL: Good  CLINICAL DECISION MAKING: Stable/uncomplicated  EVALUATION COMPLEXITY: Low   GOALS: Goals reviewed with patient? Yes  SHORT TERM GOALS: Target date: 10/10/2022  Patient will be independent in home exercise program to improve   strength/mobility for better functional independence with ADLs. Baseline: To be given. 8/20: intermittent compliance  Goal status:Partially Met  2. Patient will demonstrate sidelying <> EOB bed mobility without cervical flexion to demonstrate decrease cervical strain and improved functional mobility.  Baseline: rapid cervical flexion and sit up motion with holding of breath to sit up 8/20: able to perform  Goal status: MET    LONG TERM GOALS: Target date: 11/07/2022   Patient will increase FOTO score to equal to or greater than 84 to demonstrate statistically significant improvement in mobility and quality of life.  Baseline: 8/20: 72  Goal status: Ongoing   2.   Patient will report a worst pain of 3/10 on NPRS in headaches to improve tolerance with ADLs and reduced symptoms with activities.   Baseline: worst: 10/10 8/20: 6-7/10  Goal status: Partially Met   3.  Patient will report less < 2 / 5 on section 5 on NDI to indicate significant decrease in occurrence of headaches.  Baseline: 5/5 8/20: 2/10  Goal status: MET  4.  Patient will demonstrate upright posture in sitting and standing to decrease cervical musculature and upper trapezius resting tension.  Baseline: significant forward head posture, lumbar lordosis,  unable to achieve thoracic extension to neutral 8/20: forward head rounding shoulders but able to correct with cueing Goal status: Partially Met  5.  Patient will achieve full scapular range of motion for increased functional mobility to reach overhead with decrease compensatory muscle activation.  Baseline: no upward rotation past 90 degrees of shoulder abduction 8/20: slight improvement Goal status: Ongoing     PLAN:  PT FREQUENCY: 1-2x/week  PT DURATION: 8 weeks  PLANNED INTERVENTIONS: Therapeutic exercises, Therapeutic activity, Neuromuscular re-education, Balance training, Gait training, Patient/Family education, Self Care, Joint mobilization, Joint manipulation, Vestibular training, Canalith repositioning, Orthotic/Fit training, Dry Needling, Electrical stimulation, Spinal manipulation, Spinal mobilization, Cryotherapy, Moist heat, scar mobilization, Splintting, Taping, Traction, Biofeedback, Manual therapy, and Re-evaluation  PLAN FOR NEXT SESSION:  Pec minor stretch, thoracic extension ROM   [place] Shannahan C, PT 11/06/2022, 7:51 AM   7:51 AM, 11/06/22 Rosamaria Lints, PT, DPT Physical Therapist - [place] [facility]  Outpatient Physical Therapy- Main [facility] [phone]

## 2022-11-08 ENCOUNTER — Ambulatory Visit: Payer: BC Managed Care – PPO

## 2022-11-08 DIAGNOSIS — M25511 Pain in right shoulder: Secondary | ICD-10-CM | POA: Diagnosis not present

## 2022-11-08 DIAGNOSIS — M546 Pain in thoracic spine: Secondary | ICD-10-CM

## 2022-11-08 DIAGNOSIS — M542 Cervicalgia: Secondary | ICD-10-CM

## 2022-11-08 DIAGNOSIS — R293 Abnormal posture: Secondary | ICD-10-CM | POA: Diagnosis not present

## 2022-11-08 DIAGNOSIS — G8929 Other chronic pain: Secondary | ICD-10-CM

## 2022-11-08 NOTE — Therapy (Signed)
OUTPATIENT PHYSICAL THERAPY DISCHARGE   Patient Name: Jeremiah Carpenter MRN: [id number] DOB:06-29-1965, 57 y.o., male Today's Date: 11/08/2022  END OF SESSION:  PT End of Session - 11/08/22 0710     Visit Number 17    Number of Visits 17    Date for PT Re-Evaluation 11/07/22    Authorization Type BCBS    PT Start Time 0715    PT Stop Time 0738    PT Time Calculation (min) 23 min    Activity Tolerance Patient tolerated treatment well;No increased pain    Behavior During Therapy WFL for tasks assessed/performed               Past Medical History:  Diagnosis Date   History of colon polyps    benign   History of kidney stones    History of migraine    last one about a month ago   Hyperlipidemia    takes Atorvastatin daily   Joint pain    Weakness    numbness mainly left hand and rarely on right   Past Surgical History:  Procedure Laterality Date   ANTERIOR CERVICAL DECOMP/DISCECTOMY FUSION N/A 01/11/2016   Procedure: C5-6, C6-7 Anterior Cervical Discectomy and Fusion, Allograft, Plate;  Surgeon: Eldred Manges, MD;  Location: [facility] OR;  Service: Orthopedics;  Laterality: N/A;   CARPAL TUNNEL RELEASE Bilateral    COLONOSCOPY     COLONOSCOPY WITH PROPOFOL N/A 01/25/2020   Procedure: COLONOSCOPY WITH PROPOFOL;  Surgeon: Jeremiah Mood, MD;  Location: [facility] ENDOSCOPY;  Service: Gastroenterology;  Laterality: N/A;   VASECTOMY     Patient Active Problem List   Diagnosis Date Noted   Right facial numbness 09/26/2022   Injury of face 09/26/2022   History of seborrheic keratosis 08/28/2022   Cervical spondylosis 09/06/2021   Anterolisthesis of cervical spine (C3-C4) 03/30/2021   Cervicalgia 03/07/2021   Chronic pain syndrome 03/07/2021   Tension headache 07/11/2020   S/P cervical spinal fusion 11/13/2017   Cervical stenosis of spine 01/11/2016   Hx of adenomatous polyp of colon 05/19/2014   Low testosterone 05/06/2012   HYPERCHOLESTEROLEMIA 06/21/2009    PCP: Jeremiah Sawyers, FNP  REFERRING PROVIDER: Mort Sawyers, FNP  REFERRING DIAG:   M43.12 (ICD-10-CM) - Anterolisthesis of cervical spine M47.812 (ICD-10-CM) - Cervical spondylosis M54.12 (ICD-10-CM) - Cervical radicular pain    THERAPY DIAG:  Cervicalgia  Abnormal posture  Pain in thoracic spine  Chronic right shoulder pain  Rationale for Evaluation and Treatment: Rehabilitation  ONSET DATE: ongoing > 6 months   SUBJECTIVE:  SUBJECTIVE STATEMENT:   Patient reports feeling good this morning with some tightness in R shoulder blade.  PERTINENT HISTORY:  Pt is a 57 y.o. M with a PMH of HLD (medicated), joint pain, tension headaches, cervicalgia, and numbness of L hand. Pt has a surgical history of ACDF at two levels, C5-6, C6-7 in 2017, as well as B carpal tunnel release surgery. Pt has been a pt at [facility] pain clinic and PT clinic for chronic pain, and is requesting [name], DPT for dry needling.   PAIN:  Are you having pain? Yes 3/10 rt neck, Rt mid belly upper trap area   PRECAUTIONS: None  RED FLAGS: None     WEIGHT BEARING RESTRICTIONS: No  FALLS:  Has patient fallen in last 6 months? No  LIVING ENVIRONMENT: Lives with: lives with their family Lives in: House/apartment Stairs: Yes: Internal: 14 steps; can reach both and External: 3 steps; on right going up Has following equipment at home: None  OCCUPATION: Art gallery manager for [employer]  PLOF: Independent  PATIENT GOALS: "I want to get HA down to a 1/10, and never have to have surgery again"  NEXT MD VISIT: n/a  OBJECTIVE:    TODAY'S TREATMENT:                                                                                                                              DATE: 11/08/22  Physical therapy treatment session  today consisted of completing assessment of goals and administration of testing as demonstrated and documented in flow sheet, treatment, and goals section of this note. Goals assessed for discharge   PATIENT EDUCATION:  Education details: Charity fundraiser and current restrictions   HOME EXERCISE PROGRAM: Formal HEP to be prescribed;  -bed mobility re: supine <> sidelying with bridge and elbows into bed with no cervical flexion / sidelying <> EOB with no cervical flexion   Access Code: [email or web address] URL: [email or web address] Date: 09/17/2022 Prepared by: [name]  Exercises - Seated Scapular Retraction  - 1 x daily - 7 x weekly - 2 sets - 10 reps - 5 hold - Supine Chin Tuck  - 1 x daily - 7 x weekly - 2 sets - 10 reps - 5 hold - Seated Upper Trapezius Stretch  - 1 x daily - 7 x weekly - 2 sets - 2 reps - 30 hold  Access Code: URL: [email or web address] Date: 10/22/2022 Prepared by: [name]  Exercises - Prone Shoulder W on [employer]  - 1 x daily - 7 x weekly - 2 sets - 10 reps - 5 hold - Prone Lower Trapezius Strengthening on Swiss Ball  - 1 x daily - 7 x weekly - 2 sets - 10 reps - 5 hold - Prone Shoulder Flexion on Swiss Ball  - 1 x daily - 7 x weekly - 2 sets - 10 reps - 5 hold  Access Code: [email or web address] URL: [email or web address] Date: 11/08/2022 Prepared by: Maylon Peppers  Exercises - Doorway Pec Stretch at 90 Degrees Abduction  - 2-3 x daily - 5-7 x weekly - 3-5 reps - 30 second hold  ASSESSMENT:  CLINICAL IMPRESSION:   Reassessment of goals with patient meeting 6/7 goals except FOTO goal where he scored 83 rather than goal of 84, however adequate for discharge. Patient reports he will continue participating in HEP program established during therapy sessions with the addition of doorway pec stretch provided this date. Provided patient with blue and black therabands to progress HEP as warranted for maintenance. Patient  in agreement with discharge this date. Patient has made significant progress since start of PT and has been educated on consistent performance of HEP to maintain improvements.   OBJECTIVE IMPAIRMENTS: Abnormal gait, decreased activity tolerance, decreased endurance, decreased mobility, decreased ROM, decreased strength, hypomobility, increased fascial restrictions, increased muscle spasms, impaired flexibility, impaired UE functional use, improper body mechanics, postural dysfunction, and pain.   ACTIVITY LIMITATIONS: carrying, lifting, bending, sitting, standing, squatting, sleeping, bed mobility, reach over head, and locomotion level  PARTICIPATION LIMITATIONS: community activity, occupation, and yard work  PERSONAL FACTORS: Behavior pattern, Fitness, Past/current experiences, Profession, Time since onset of injury/illness/exacerbation, and 1-2 comorbidities: HLD and cervicalgia  are also affecting patient's functional outcome.   REHAB POTENTIAL: Good  CLINICAL DECISION MAKING: Stable/uncomplicated  EVALUATION COMPLEXITY: Low   GOALS: Goals reviewed with patient? Yes  SHORT TERM GOALS: Target date: 10/10/2022  Patient will be independent in home exercise program to improve   strength/mobility for better functional independence with ADLs. Baseline: To be given. 8/20: intermittent compliance 9/12: patient reports going to continue performing exercises at discharge to maintain current movement  Goal status: MET   2. Patient will demonstrate sidelying <> EOB bed mobility without cervical flexion to demonstrate decrease cervical strain and improved functional mobility.  Baseline: rapid cervical flexion and sit up motion with holding of breath to sit up 8/20: able to perform  Goal status: MET    LONG TERM GOALS: Target date: 11/07/2022   Patient will increase FOTO score to equal to or greater than 84 to demonstrate statistically significant improvement in mobility and quality of life.   Baseline: 8/20: 72; 9/12 83 Goal status: Adequate for Discharge    2.   Patient will report a worst pain of 3/10 on NPRS in headaches to improve tolerance with ADLs and reduced symptoms with activities.   Baseline: worst: 10/10 8/20: 6-7/10; 9/12: 3/10  Goal status: MET   3.  Patient will report less < 2 / 5 on section 5 on NDI to indicate significant decrease in occurrence of headaches.  Baseline: 5/5 8/20: 2/10  Goal status: MET  4.  Patient will demonstrate upright posture in sitting and standing to decrease cervical musculature and upper trapezius resting tension.  Baseline: significant forward head posture, lumbar lordosis, unable to achieve thoracic extension to neutral 8/20: forward head rounding shoulders but able to correct with cueing; 9/12: improved forward head posture and shoulder positioning in unsupported sitting without cues Goal status: MET   5.  Patient will achieve full scapular range of motion for increased functional mobility to reach overhead with decrease compensatory muscle activation.  Baseline: no upward rotation past 90 degrees of shoulder abduction 8/20: slight improvement 9/12: able to complete full scapular motion with minimal compensatory strategy. Goal status: MET     PLAN:  PT FREQUENCY: 1-2x/week  PT DURATION: 8 weeks  PLANNED INTERVENTIONS: Therapeutic exercises, Therapeutic activity, Neuromuscular re-education, Balance training, Gait training, Patient/Family education,  Self Care, Joint mobilization, Joint manipulation, Vestibular training, Canalith repositioning, Orthotic/Fit training, Dry Needling, Electrical stimulation, Spinal manipulation, Spinal mobilization, Cryotherapy, Moist heat, scar mobilization, Splintting, Taping, Traction, Biofeedback, Manual therapy, and Re-evaluation  PLAN FOR NEXT SESSION:  Discharged  Viviann Spare, PT, DPT 11/08/2022, 7:43 AM  Physical Therapist - [facility]  Outpatient  Physical Therapy- Main Campus [phone]

## 2022-11-13 ENCOUNTER — Ambulatory Visit: Payer: BC Managed Care – PPO

## 2022-11-15 ENCOUNTER — Ambulatory Visit: Payer: BC Managed Care – PPO

## 2022-11-20 ENCOUNTER — Ambulatory Visit: Payer: BC Managed Care – PPO

## 2023-01-11 ENCOUNTER — Encounter: Payer: Self-pay | Admitting: Family

## 2023-01-11 DIAGNOSIS — Z981 Arthrodesis status: Secondary | ICD-10-CM

## 2023-01-11 DIAGNOSIS — G894 Chronic pain syndrome: Secondary | ICD-10-CM

## 2023-01-11 DIAGNOSIS — M4802 Spinal stenosis, cervical region: Secondary | ICD-10-CM

## 2023-01-11 DIAGNOSIS — E7800 Pure hypercholesterolemia, unspecified: Secondary | ICD-10-CM

## 2023-01-13 MED ORDER — ATORVASTATIN CALCIUM 80 MG PO TABS: 80.0000 mg | ORAL_TABLET | Freq: Every day | ORAL | 3 refills | Status: DC

## 2023-01-13 MED ORDER — GABAPENTIN 300 MG PO CAPS: 300.0000 mg | ORAL_CAPSULE | Freq: Every day | ORAL | 3 refills | Status: DC

## 2023-01-21 ENCOUNTER — Other Ambulatory Visit: Payer: Self-pay | Admitting: Family

## 2023-01-21 DIAGNOSIS — G894 Chronic pain syndrome: Secondary | ICD-10-CM

## 2023-01-21 DIAGNOSIS — M4802 Spinal stenosis, cervical region: Secondary | ICD-10-CM

## 2023-01-21 DIAGNOSIS — Z981 Arthrodesis status: Secondary | ICD-10-CM

## 2023-01-30 ENCOUNTER — Other Ambulatory Visit: Payer: Self-pay | Admitting: Family

## 2023-01-30 NOTE — Telephone Encounter (Signed)
Error

## 2023-02-01 MED ORDER — GABAPENTIN 300 MG PO CAPS: 300.0000 mg | ORAL_CAPSULE | Freq: Every day | ORAL | 3 refills | Status: AC

## 2023-02-01 NOTE — Addendum Note (Signed)
Addended by: Mort Sawyers on: 02/01/2023 05:14 PM   Modules accepted: Orders

## 2023-02-07 ENCOUNTER — Ambulatory Visit: Payer: BC Managed Care – PPO | Admitting: Dermatology

## 2023-02-07 DIAGNOSIS — L918 Other hypertrophic disorders of the skin: Secondary | ICD-10-CM

## 2023-02-07 DIAGNOSIS — L578 Other skin changes due to chronic exposure to nonionizing radiation: Secondary | ICD-10-CM | POA: Diagnosis not present

## 2023-02-07 DIAGNOSIS — W908XXA Exposure to other nonionizing radiation, initial encounter: Secondary | ICD-10-CM

## 2023-02-07 DIAGNOSIS — L821 Other seborrheic keratosis: Secondary | ICD-10-CM

## 2023-02-07 DIAGNOSIS — L820 Inflamed seborrheic keratosis: Secondary | ICD-10-CM | POA: Diagnosis not present

## 2023-02-07 NOTE — Progress Notes (Signed)
   New Patient Visit   Subjective  Jeremiah Carpenter is a 57 y.o. male who presents for the following: spot at face, present for about 2-4 years. Patient would also like to ask about skin tags.  The patient has spots, moles and lesions to be evaluated, some may be new or changing and the patient may have concern these could be cancer.   The following portions of the chart were reviewed this encounter and updated as appropriate: medications, allergies, medical history  Review of Systems:  No other skin or systemic complaints except as noted in HPI or Assessment and Plan.  Objective  Well appearing patient in no apparent distress; mood and affect are within normal limits.   A focused examination was performed of the following areas: face  Relevant exam findings are noted in the Assessment and Plan.  R cheek/face x 4 Erythematous stuck-on, waxy papule or plaque  Assessment & Plan   ACTINIC DAMAGE - chronic, secondary to cumulative UV radiation exposure/sun exposure over time - diffuse scaly erythematous macules with underlying dyspigmentation - Recommend daily broad spectrum sunscreen SPF 30+ to sun-exposed areas, reapply every 2 hours as needed.  - Recommend staying in the shade or wearing long sleeves, sun glasses (UVA+UVB protection) and wide brim hats (4-inch brim around the entire circumference of the hat). - Call for new or changing lesions.  SEBORRHEIC KERATOSIS - Stuck-on, waxy, tan-brown papules and/or plaques  - Benign-appearing - Discussed benign etiology and prognosis. - Observe - Call for any changes  Acrochordons (Skin Tags) - Fleshy, skin-colored pedunculated papules - Benign appearing.  - Observe. - If desired, they can be removed with an in office procedure that is not covered by insurance. - Please call the clinic if you notice any new or changing lesions.  INFLAMED SEBORRHEIC KERATOSIS R cheek/face x 4 Symptomatic, irritating, patient would like  treated.  Benign-appearing.  Call clinic for new or changing lesions.   Destruction of lesion - R cheek/face x 4  Destruction method: cryotherapy   Informed consent: discussed and consent obtained   Lesion destroyed using liquid nitrogen: Yes   Cryotherapy cycles:  2 Outcome: patient tolerated procedure well with no complications   Post-procedure details: wound care instructions given    Return if symptoms worsen or fail to improve.  Anise Salvo, RMA, am acting as scribe for Armida Sans, MD .   Documentation: I have reviewed the above documentation for accuracy and completeness, and I agree with the above.  Armida Sans, MD

## 2023-02-07 NOTE — Patient Instructions (Signed)

## 2023-02-12 ENCOUNTER — Encounter: Payer: Self-pay | Admitting: Dermatology

## 2023-07-04 ENCOUNTER — Ambulatory Visit: Payer: Self-pay | Admitting: Family

## 2023-07-04 NOTE — Telephone Encounter (Signed)
 Chief Complaint: Elevated blood pressure  Symptoms: No symptoms Pertinent Negatives: Patient denies blurred vision, chest pain, breathing difficulty, headache, weakness  Disposition: [x] Appointment (In office)  Additional Notes: Patient states he saw his oral surgeon this morning and had the following blood pressures: 153/106, 140/94, 149/102, 150/95, 160/109. Pt states he does not have a way to check his blood pressure at home. Pt denies any history of high blood pressure. Pt scheduled for an appointment tomorrow with PCP in office. This RN educated pt on new-worsening symptoms and when to call back/seek emergent care. Pt verbalized understanding and agrees to plan.   Copied from CRM [id number]. Topic: Clinical - Red Word Triage >> Jul 04, 2023 10:07 AM Marissa P wrote: Red Word that prompted transfer to Nurse Triage: Elevated pressure/ high blood pressure and would like to follow up with that as soon as possible Reason for Disposition  Systolic BP  >= 160 OR Diastolic >= 100  Answer Assessment - Initial Assessment Questions BLOOD PRESSURE: "What is the blood pressure?" "Did you take at least two measurements 5 minutes apart?"     153/106, 140/94, 149/102, 150/95, 160/109 ONSET: "When did you take your blood pressure?"     This morning at oral surgeon's office HISTORY: "Do you have a history of high blood pressure?"     No MEDICINES: "Are you taking any medicines for blood pressure?" "Have you missed any doses recently?"     No OTHER SYMPTOMS: "Do you have any symptoms?" (e.g., blurred vision, chest pain, difficulty breathing, headache, weakness)     None of above  Protocols used: Blood Pressure - High-A-AH

## 2023-07-04 NOTE — Telephone Encounter (Signed)
 NOTED

## 2023-07-05 ENCOUNTER — Ambulatory Visit: Admitting: Family

## 2023-07-05 VITALS — BP 124/86 | HR 85 | Temp 98.2°F | Ht 65.0 in | Wt 183.4 lb

## 2023-07-05 DIAGNOSIS — M792 Neuralgia and neuritis, unspecified: Secondary | ICD-10-CM | POA: Diagnosis not present

## 2023-07-05 DIAGNOSIS — G5602 Carpal tunnel syndrome, left upper limb: Secondary | ICD-10-CM | POA: Diagnosis not present

## 2023-07-05 DIAGNOSIS — E782 Mixed hyperlipidemia: Secondary | ICD-10-CM

## 2023-07-05 DIAGNOSIS — E875 Hyperkalemia: Secondary | ICD-10-CM

## 2023-07-05 LAB — LIPID PANEL
Cholesterol: 172 mg/dL (ref 0–200)
HDL: 39.6 mg/dL (ref >39.00)
LDL Cholesterol: 84 mg/dL (ref 0–99)
NonHDL: 132.3
Total CHOL/HDL Ratio: 4
Triglycerides: 244 mg/dL — ABNORMAL HIGH (ref 0.0–149.0)
VLDL: 48.8 mg/dL — ABNORMAL HIGH (ref 0.0–40.0)

## 2023-07-05 LAB — COMPREHENSIVE METABOLIC PANEL WITH GFR
ALT: 29 U/L (ref 0–53)
AST: 21 U/L (ref 0–37)
Albumin: 4.4 g/dL (ref 3.5–5.2)
Alkaline Phosphatase: 61 U/L (ref 39–117)
BUN: 16 mg/dL (ref 6–23)
CO2: 28 meq/L (ref 19–32)
Calcium: 9.2 mg/dL (ref 8.4–10.5)
Chloride: 104 meq/L (ref 96–112)
Creatinine, Ser: 0.96 mg/dL (ref 0.40–1.50)
GFR: 86.43 mL/min (ref >60.00)
Glucose, Bld: 88 mg/dL (ref 70–99)
Potassium: 4.3 meq/L (ref 3.5–5.1)
Sodium: 140 meq/L (ref 135–145)
Total Bilirubin: 0.7 mg/dL (ref 0.2–1.2)
Total Protein: 7 g/dL (ref 6.0–8.3)

## 2023-07-05 LAB — VITAMIN B12: Vitamin B-12: 228 pg/mL (ref 211–911)

## 2023-07-05 NOTE — Progress Notes (Signed)
 Established Patient Office Visit  Subjective:   Patient ID: Jeremiah Carpenter, male    DOB: June 04, 1965  Age: 58 y.o. MRN: [id number]  CC:  Chief Complaint  Patient presents with   Acute Visit    Had a procedure done his jaw yesterday and during the procedure his BP was elevated.    HPI: Jeremiah Carpenter is a 58 y.o. male presenting on 07/05/2023 for Acute Visit (Had a procedure done his jaw yesterday and during the procedure his BP was elevated.)  He is relocating early June for his job to [place] .   Had an implant in his jaw yesterday seeing oral surgeon,and had elevated blood pressure. He was anxious during the procedurle.    HLD: on atorvastatin  to 80 mg recent increase last visit.  Tolerating well.   Left hand numbness, thinks related to nerve pain in his neck.  Sees chiropractor, has had carpal tunnel in the past.       ROS: Negative unless specifically indicated above in HPI.   Relevant past medical history reviewed and updated as indicated.   Allergies and medications reviewed and updated.   Current Outpatient Medications:    atorvastatin  (LIPITOR) 80 MG tablet, Take 1 tablet (80 mg total) by mouth daily., Disp: 90 tablet, Rfl: 3   gabapentin  (NEURONTIN ) 300 MG capsule, Take 1 capsule (300 mg total) by mouth daily., Disp: 90 capsule, Rfl: 3  Allergies  Allergen Reactions   Amoxicillin Hives and Rash    Has patient had a PCN reaction causing immediate rash, facial/tongue/throat swelling, SOB or lightheadedness with hypotension: no Has patient had a PCN reaction causing severe rash involving mucus membranes or skin necrosis:no Has patient had a PCN reaction that required hospitalization no Has patient had a PCN reaction occurring within the last 10 years: yes If all of the above answers are "NO", then may proceed with Cephalosporin use.    Objective:   BP 124/86 (BP Location: Left Arm, Patient Position: Sitting, Cuff Size: Large)   Pulse 85   Temp 98.2  F (36.8 C) (Temporal)   Ht 5\' 5"  (1.651 m)   Wt 183 lb 6.4 oz (83.2 kg)   SpO2 98%   BMI 30.52 kg/m    Physical Exam Constitutional:      General: He is not in acute distress.    Appearance: Normal appearance. He is normal weight. He is not ill-appearing, toxic-appearing or diaphoretic.  Cardiovascular:     Rate and Rhythm: Normal rate and regular rhythm.  Pulmonary:     Effort: Pulmonary effort is normal.  Musculoskeletal:        General: Normal range of motion.     Right lower leg: No edema.     Left lower leg: No edema.  Neurological:     General: No focal deficit present.     Mental Status: He is alert and oriented to person, place, and time. Mental status is at baseline.  Psychiatric:        Mood and Affect: Mood normal.        Behavior: Behavior normal.        Thought Content: Thought content normal.        Judgment: Judgment normal.     Assessment & Plan:  Mixed hyperlipidemia Assessment & Plan: Ordered lipid panel, pending results. Work on low cholesterol diet and exercise as tolerated   Orders: -     Lipid panel  Serum potassium elevated -     Comprehensive metabolic  panel with GFR  Neuralgia -     Vitamin B12  Carpal tunnel syndrome of left wrist Assessment & Plan: Wear brace at night  Hand surgery if no improvement  B12 ordered for neuralgia r/o deficiency      Follow up plan: Return for prn as pt moving .  Felicita Horns, FNP

## 2023-07-05 NOTE — Assessment & Plan Note (Signed)
 Wear brace at night  Hand surgery if no improvement  B12 ordered for neuralgia r/o deficiency

## 2023-07-05 NOTE — Assessment & Plan Note (Signed)
 Ordered lipid panel, pending results. Work on low cholesterol diet and exercise as tolerated

## 2023-07-08 ENCOUNTER — Encounter: Payer: Self-pay | Admitting: Family

## 2023-09-30 ENCOUNTER — Other Ambulatory Visit: Payer: Self-pay | Admitting: Family

## 2023-09-30 DIAGNOSIS — E7800 Pure hypercholesterolemia, unspecified: Secondary | ICD-10-CM

## 2023-11-28 IMAGING — MR MR CERVICAL SPINE W/O CM
4 of 5 series · 25 of 48 positions shown · non-contrast
Comparison: Prior MRI from 09/21/2015.

CLINICAL DATA: Initial evaluation for chronic right lateral neck
pain with radiation into the right shoulder and base of skull,
headaches. History of prior surgery.

EXAM:
MRI CERVICAL SPINE WITHOUT CONTRAST
TECHNIQUE: Multiplanar, multisequence MR imaging of the cervical spine was
performed. No intravenous contrast was administered.

[Series 5: t2_tse_warp_sag · sagittal · 3.0mm · 0.39mm/px · 8 of 15 slices shown]
[im 1/15]
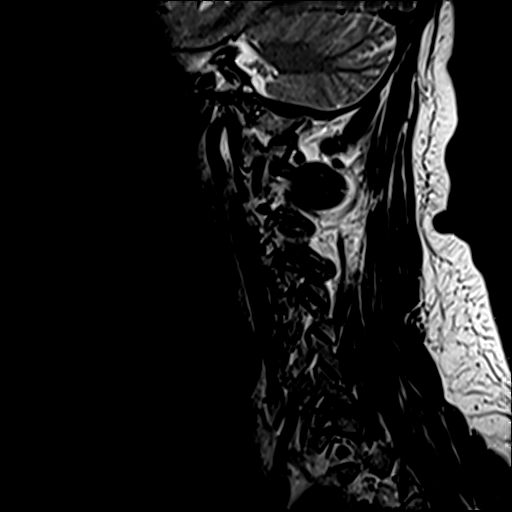
[im 3/15]
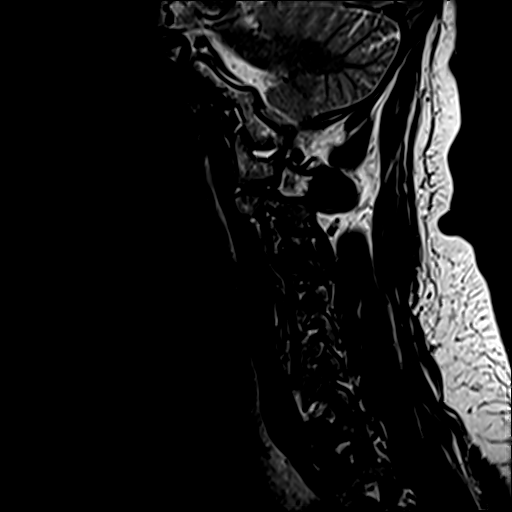
[im 5/15]
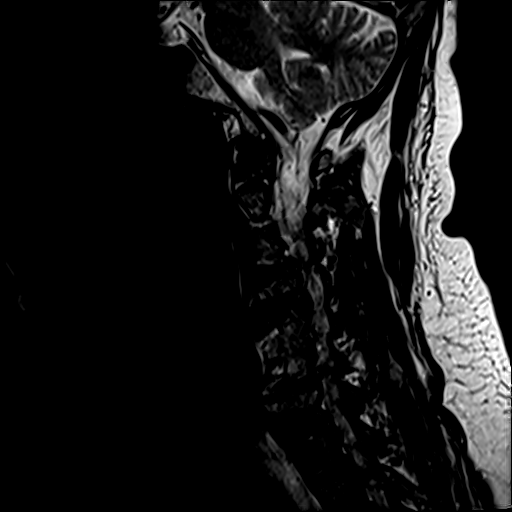
[im 7/15]
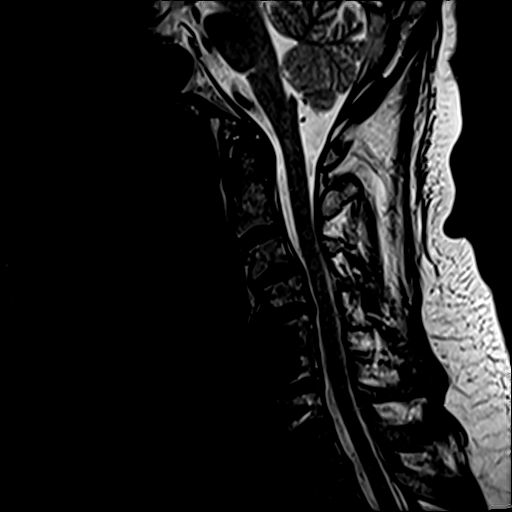
[im 9/15]
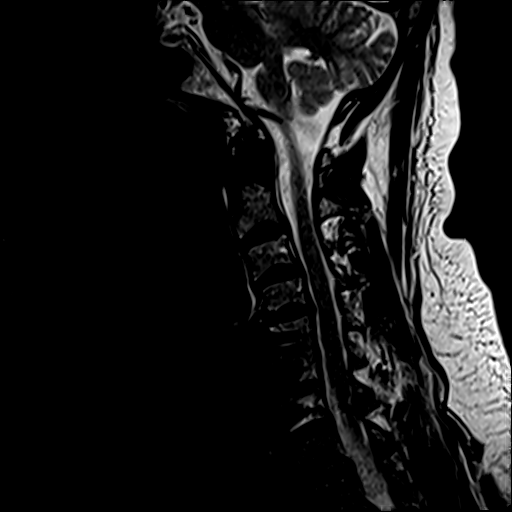
[im 11/15]
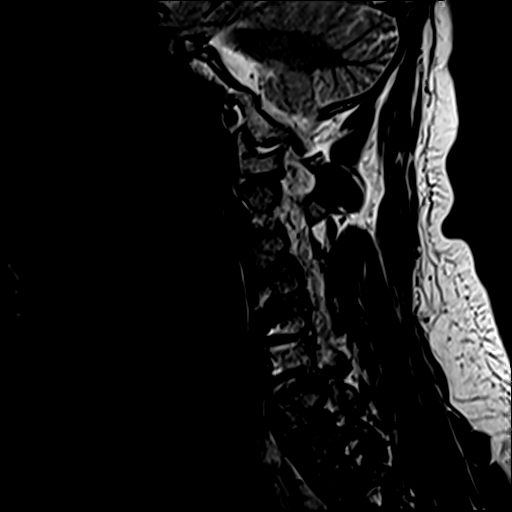
[im 13/15]
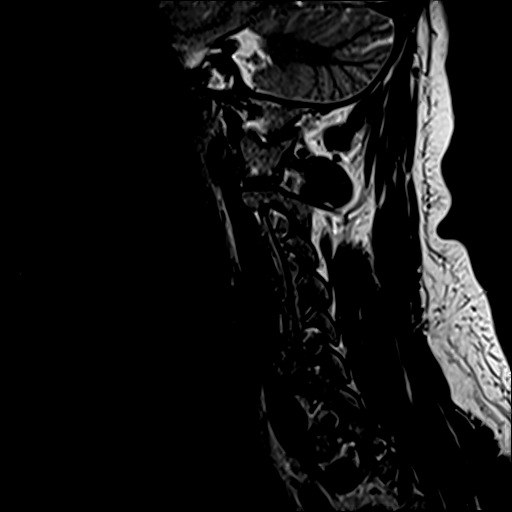
[im 15/15]
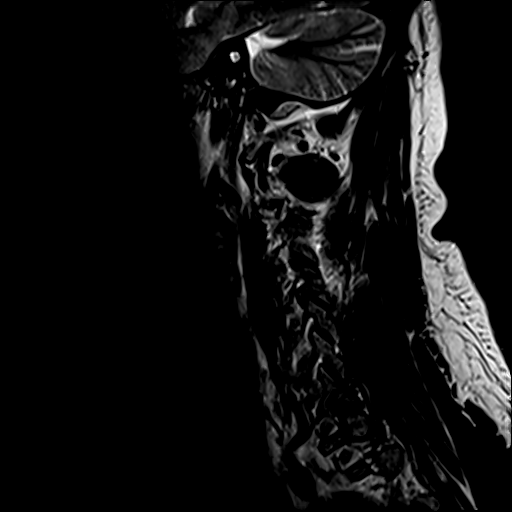

[Series 6: t1_tse_warp_sag · sagittal · 3.0mm · 0.39mm/px · 7 of 15 slices shown]
[im 1/15]
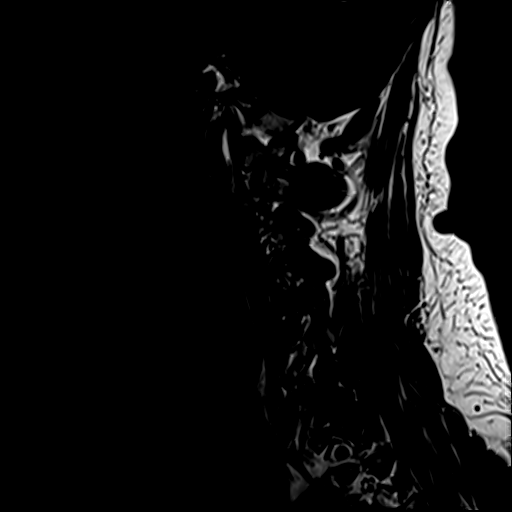
[im 3/15]
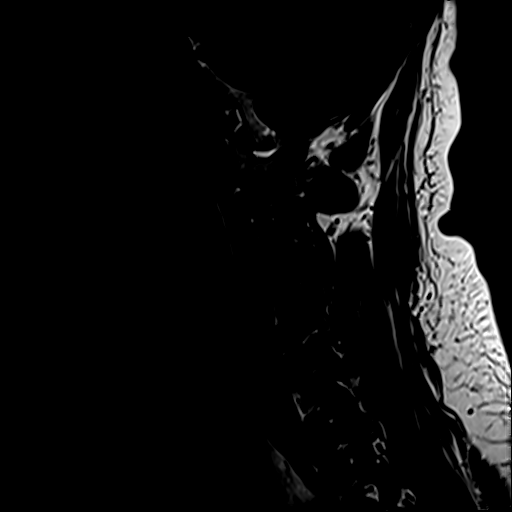
[im 5/15]
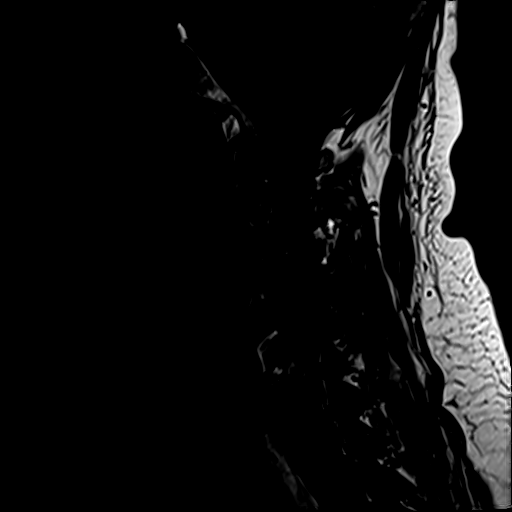
[im 8/15]
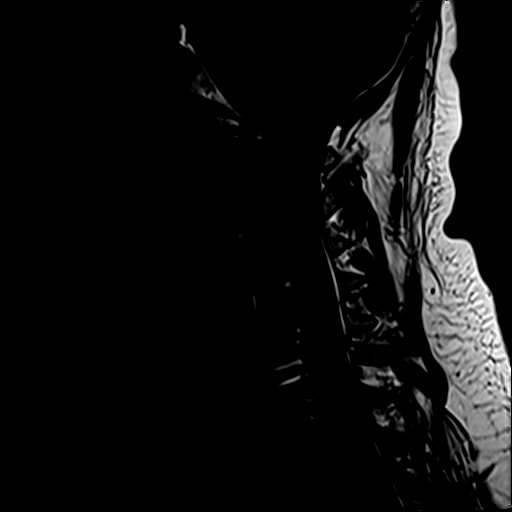
[im 10/15]
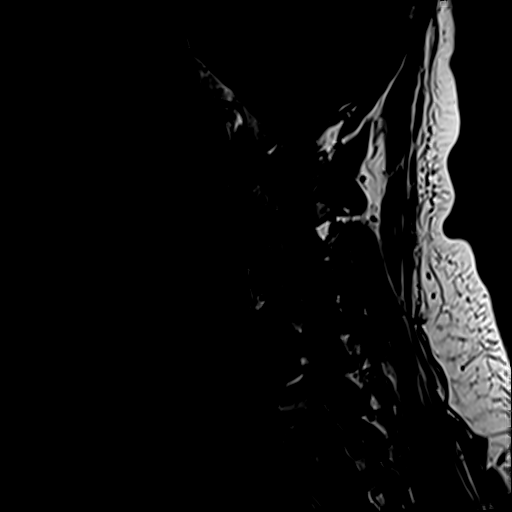
[im 12/15]
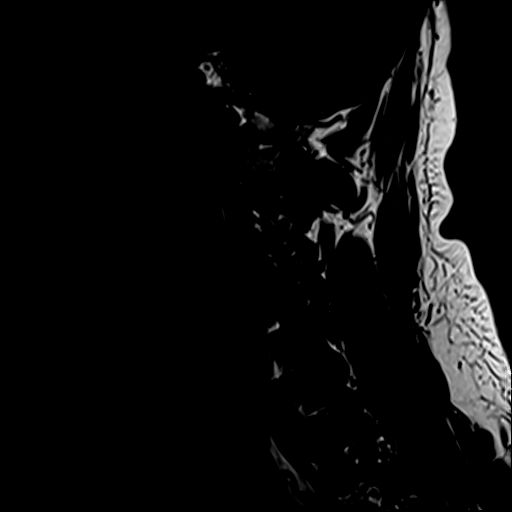
[im 15/15]
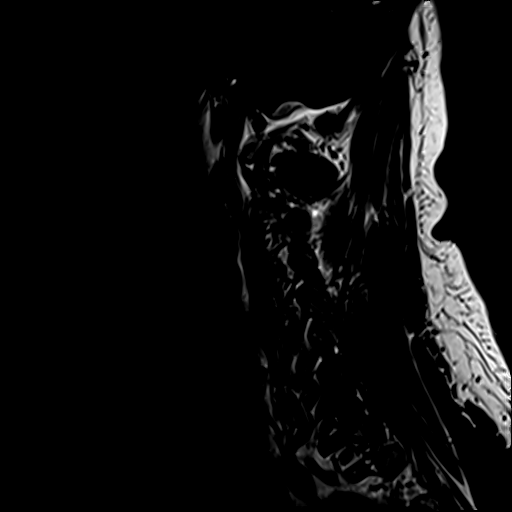

[Series 7: t2_tse_stir_warp_sag · sagittal · 3.0mm · 0.78mm/px · 7 of 15 slices shown]
[im 1/15]
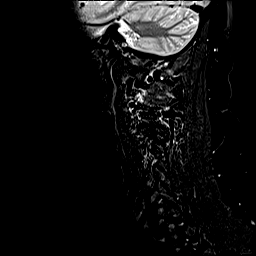
[im 3/15]
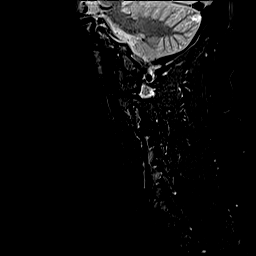
[im 5/15]
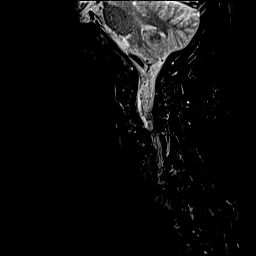
[im 8/15]
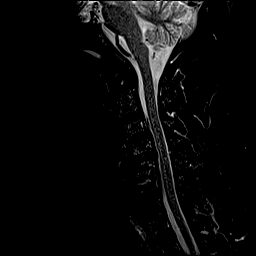
[im 10/15]
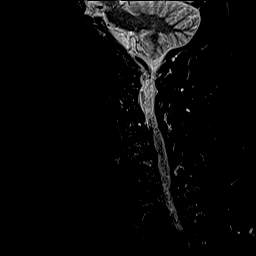
[im 12/15]
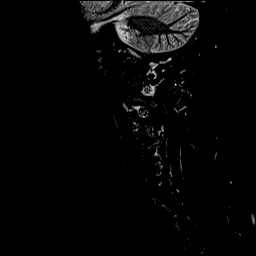
[im 15/15]
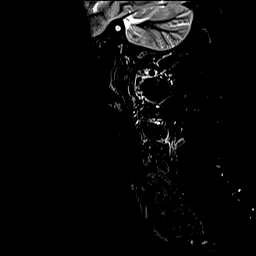

[Series 8: t2_tse_warp_tra · axial · 3.0mm · 0.78mm/px · z∈[-101,-40]mm · 3 of 28 slices shown]
[im 5/28]
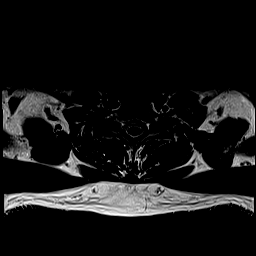
[im 14/28]
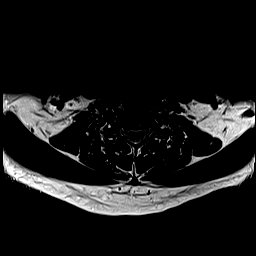
[im 23/28]
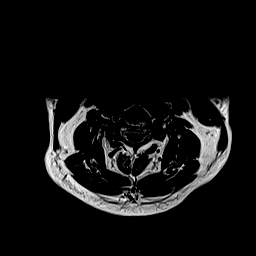

[25 of 48 positions shown; findings below may reference images not displayed]

FINDINGS: Alignment: Reversal of the normal cervical lordosis with trace
stepwise degenerative anterolisthesis of C2 on C3 and C3 on C4,
chronic and facet mediated.

Vertebrae: Prior ACDF at C5-C7. Solid arthrodesis at C5-6. Fusion
not established at C6-7 by MRI. Vertebral body height maintained
without acute or chronic fracture. Bone marrow signal intensity
within normal limits. Subcentimeter benign hemangioma noted within
the T1 vertebral body. No worrisome osseous lesions. Mild discogenic
reactive endplate change present about the C4-5 interspace. No other
abnormal marrow edema.

Cord: Normal signal and morphology.

Posterior Fossa, vertebral arteries, paraspinal tissues: Visualized
brain and posterior fossa within normal limits. Craniocervical
junction normal. Paraspinous soft tissues within normal limits.
Normal flow voids seen within the vertebral arteries bilaterally.

Disc levels:

C2-C3: Left paracentral disc osteophyte mildly indents the left
ventral thecal sac. Right-sided facet arthrosis. No significant
canal or foraminal stenosis.

C3-C4: Trace anterolisthesis. Disc bulge with endplate and
uncovertebral spurring. Moderate right-sided facet arthrosis. No
significant spinal stenosis. Severe right C4 foraminal stenosis.
Left neural foramen remains patent.

C4-C5: Disc bulge with endplate and uncovertebral spurring. Mild
facet hypertrophy. Resultant mild spinal stenosis. Mild bilateral C5
foraminal narrowing.

C5-C6:  Prior fusion.  No residual canal or foraminal stenosis.

C6-C7: Prior fusion. No residual spinal stenosis. Residual
uncovertebral spurring with persistent severe bilateral C7 foraminal
stenosis.

C7-T1: Mild left-sided uncovertebral spurring without significant
disc bulge. Minimal facet hypertrophy. No spinal stenosis. Foramina
remain patent.

Visualized upper thoracic spine demonstrates minimal noncompressive
disc bulging without significant stenosis.
IMPRESSION: 1. Prior ACDF at C5-C7 without residual spinal stenosis.
Uncovertebral spurring at C6-7 with residual severe bilateral C7
foraminal stenosis.
2. Severe right C4 foraminal stenosis related to uncovertebral and
facet disease.
3. Disc bulge with uncovertebral and facet hypertrophy at C4-5 with
resultant mild canal and bilateral C5 foraminal stenosis.

## 2024-03-30 ENCOUNTER — Other Ambulatory Visit: Payer: Self-pay | Admitting: Family

## 2024-03-30 DIAGNOSIS — E7800 Pure hypercholesterolemia, unspecified: Secondary | ICD-10-CM
